# Patient Record
Sex: Male | Born: 1945
Health system: Southern US, Community
[De-identification: ages and names within clinical notes are randomized; demographics above are authoritative.]

## PROBLEM LIST (undated history)

## (undated) DIAGNOSIS — M4807 Spinal stenosis, lumbosacral region: Secondary | ICD-10-CM

## (undated) DIAGNOSIS — M48062 Spinal stenosis, lumbar region with neurogenic claudication: Secondary | ICD-10-CM

## (undated) DIAGNOSIS — I4891 Unspecified atrial fibrillation: Secondary | ICD-10-CM

## (undated) DIAGNOSIS — L570 Actinic keratosis: Secondary | ICD-10-CM

## (undated) DIAGNOSIS — I519 Heart disease, unspecified: Secondary | ICD-10-CM

## (undated) DIAGNOSIS — G5601 Carpal tunnel syndrome, right upper limb: Secondary | ICD-10-CM

## (undated) DIAGNOSIS — N400 Enlarged prostate without lower urinary tract symptoms: Secondary | ICD-10-CM

## (undated) DIAGNOSIS — N529 Male erectile dysfunction, unspecified: Secondary | ICD-10-CM

## (undated) DIAGNOSIS — Z974 Presence of external hearing-aid: Secondary | ICD-10-CM

## (undated) DIAGNOSIS — M1990 Unspecified osteoarthritis, unspecified site: Secondary | ICD-10-CM

## (undated) DIAGNOSIS — K219 Gastro-esophageal reflux disease without esophagitis: Secondary | ICD-10-CM

## (undated) DIAGNOSIS — T7840XA Allergy, unspecified, initial encounter: Secondary | ICD-10-CM

## (undated) DIAGNOSIS — M5126 Other intervertebral disc displacement, lumbar region: Secondary | ICD-10-CM

## (undated) DIAGNOSIS — M9983 Other biomechanical lesions of lumbar region: Secondary | ICD-10-CM

## (undated) DIAGNOSIS — I10 Essential (primary) hypertension: Secondary | ICD-10-CM

## (undated) DIAGNOSIS — E785 Hyperlipidemia, unspecified: Secondary | ICD-10-CM

## (undated) DIAGNOSIS — M19012 Primary osteoarthritis, left shoulder: Secondary | ICD-10-CM

## (undated) DIAGNOSIS — C439 Malignant melanoma of skin, unspecified: Secondary | ICD-10-CM

## (undated) HISTORY — DX: Allergy, unspecified, initial encounter: T78.40XA

## (undated) HISTORY — DX: Hyperlipidemia, unspecified: E78.5

## (undated) HISTORY — DX: Gastro-esophageal reflux disease without esophagitis: K21.9

## (undated) HISTORY — PX: MELANOMA EXCISION: SHX5266

## (undated) HISTORY — DX: Other intervertebral disc displacement, lumbar region: M51.26

## (undated) HISTORY — PX: KNEE ARTHROSCOPY: SUR90

## (undated) HISTORY — PX: COLONOSCOPY: SHX174

## (undated) HISTORY — DX: Malignant melanoma of skin, unspecified: C43.9

## (undated) HISTORY — PX: HAND SURGERY: SHX662

---

## 2002-06-13 HISTORY — PX: CATARACT EXTRACTION: SUR2

## 2003-03-15 HISTORY — PX: CARDIAC CATHETERIZATION: SHX172

## 2004-01-29 ENCOUNTER — Ambulatory Visit: Payer: Self-pay | Admitting: Internal Medicine

## 2004-03-17 ENCOUNTER — Ambulatory Visit: Payer: Self-pay | Admitting: Specialist

## 2004-04-02 ENCOUNTER — Ambulatory Visit: Payer: Self-pay | Admitting: Specialist

## 2004-08-13 ENCOUNTER — Encounter: Payer: Self-pay | Admitting: Specialist

## 2004-09-11 ENCOUNTER — Encounter: Payer: Self-pay | Admitting: Specialist

## 2004-10-12 ENCOUNTER — Encounter: Payer: Self-pay | Admitting: Specialist

## 2004-12-29 ENCOUNTER — Ambulatory Visit: Payer: Self-pay | Admitting: Family Medicine

## 2005-06-04 ENCOUNTER — Ambulatory Visit: Payer: Self-pay | Admitting: Specialist

## 2005-09-07 ENCOUNTER — Ambulatory Visit: Payer: Self-pay | Admitting: Specialist

## 2005-10-04 ENCOUNTER — Encounter: Payer: Self-pay | Admitting: Specialist

## 2005-10-12 ENCOUNTER — Encounter: Payer: Self-pay | Admitting: Specialist

## 2005-11-12 ENCOUNTER — Encounter: Payer: Self-pay | Admitting: Specialist

## 2006-03-17 ENCOUNTER — Ambulatory Visit: Payer: Self-pay | Admitting: General Practice

## 2006-05-16 ENCOUNTER — Ambulatory Visit: Payer: Self-pay | Admitting: Ophthalmology

## 2007-09-05 ENCOUNTER — Ambulatory Visit: Payer: Self-pay

## 2007-09-26 ENCOUNTER — Ambulatory Visit: Payer: Self-pay | Admitting: Family Medicine

## 2008-03-14 HISTORY — PX: SHOULDER SURGERY: SHX246

## 2008-08-01 ENCOUNTER — Ambulatory Visit: Payer: Self-pay | Admitting: Family Medicine

## 2008-10-14 ENCOUNTER — Other Ambulatory Visit: Payer: Self-pay | Admitting: Family Medicine

## 2009-04-14 ENCOUNTER — Encounter: Payer: Self-pay | Admitting: Specialist

## 2009-05-12 ENCOUNTER — Encounter: Payer: Self-pay | Admitting: Specialist

## 2009-07-30 ENCOUNTER — Other Ambulatory Visit: Payer: Self-pay | Admitting: Family Medicine

## 2010-01-28 ENCOUNTER — Other Ambulatory Visit: Payer: Self-pay | Admitting: Family Medicine

## 2010-09-20 ENCOUNTER — Other Ambulatory Visit: Payer: Self-pay | Admitting: Family Medicine

## 2010-09-20 LAB — CBC WITH DIFFERENTIAL/PLATELET
Basophil #: 0 x10 (ref 0.0–0.1)
Basophil %: 0.7 %
Eosinophil #: 0.1 x10 (ref 0.0–0.7)
Eosinophil %: 1.8 %
HCT: 42.6 % (ref 40.0–52.0)
HGB: 14.5 g/dL (ref 13.0–18.0)
Lymphocyte #: 1.9 x10 (ref 1.0–3.6)
Lymphocyte %: 36.1 %
MCH: 33.8 pg (ref 26.0–34.0)
MCHC: 34.1 g/dL (ref 32.0–36.0)
MCV: 99 fL (ref 80–100)
Monocyte #: 0.7 x10 (ref 0.0–0.7)
Monocyte %: 13.3 %
Neutrophil #: 2.5 x10 (ref 1.4–6.5)
Neutrophil %: 48.1 %
Platelet: 170 x10 (ref 150–440)
RBC: 4.3 x10 — ABNORMAL LOW (ref 4.40–5.90)
RDW: 13.1 % (ref 11.5–14.5)
WBC: 5.1 x10 (ref 3.8–10.6)

## 2010-09-20 LAB — LIPID PANEL
Cholesterol: 138 mg/dL (ref 0–200)
HDL Cholesterol: 56 mg/dL (ref 40–60)
Ldl Cholesterol, Calc: 64 mg/dL (ref 0–100)
Triglycerides: 92 mg/dL (ref 0–200)
VLDL Cholesterol, Calc: 18 mg/dL (ref 5–40)

## 2010-09-20 LAB — COMPREHENSIVE METABOLIC PANEL
Albumin: 4.2 g/dL (ref 3.4–5.0)
Alkaline Phosphatase: 62 U/L (ref 50–136)
Anion Gap: 8 (ref 7–16)
BUN: 29 mg/dL — ABNORMAL HIGH (ref 7–18)
Bilirubin,Total: 0.6 mg/dL (ref 0.2–1.0)
Calcium, Total: 8.9 mg/dL (ref 8.5–10.1)
Chloride: 108 mmol/L — ABNORMAL HIGH (ref 98–107)
Co2: 27 mmol/L (ref 21–32)
Creatinine: 0.94 mg/dL (ref 0.60–1.30)
EGFR (African American): >60
EGFR (Non-African Amer.): >60
Glucose: 92 mg/dL (ref 65–99)
Osmolality: 290 (ref 275–301)
Potassium: 4.2 mmol/L (ref 3.5–5.1)
SGOT(AST): 36 U/L (ref 15–37)
SGPT (ALT): 31 U/L (ref 30–79)
Sodium: 143 mmol/L (ref 136–145)
Total Protein: 7.3 g/dL (ref 6.4–8.2)

## 2010-09-20 LAB — TSH: Thyroid Stimulating Horm: 1.46 u[IU]/mL

## 2010-09-21 LAB — PSA: PSA: 0.4 ng/mL (ref 0.0–4.0)

## 2011-09-30 ENCOUNTER — Other Ambulatory Visit: Payer: Self-pay | Admitting: Family Medicine

## 2011-09-30 LAB — CBC WITH DIFFERENTIAL/PLATELET
Basophil #: 0.1 10*3/uL (ref 0.0–0.1)
Basophil %: 0.9 %
Eosinophil #: 0.2 10*3/uL (ref 0.0–0.7)
Eosinophil %: 2.8 %
HCT: 42.1 % (ref 40.0–52.0)
HGB: 14.4 g/dL (ref 13.0–18.0)
Lymphocyte #: 2 10*3/uL (ref 1.0–3.6)
Lymphocyte %: 33.9 %
MCH: 32.5 pg (ref 26.0–34.0)
MCHC: 34.2 g/dL (ref 32.0–36.0)
MCV: 95 fL (ref 80–100)
Monocyte #: 0.7 x10 3/mm (ref 0.2–1.0)
Monocyte %: 12.6 %
Neutrophil #: 2.9 10*3/uL (ref 1.4–6.5)
Neutrophil %: 49.8 %
Platelet: 229 10*3/uL (ref 150–440)
RBC: 4.42 10*6/uL (ref 4.40–5.90)
RDW: 13.4 % (ref 11.5–14.5)
WBC: 5.9 10*3/uL (ref 3.8–10.6)

## 2011-09-30 LAB — COMPREHENSIVE METABOLIC PANEL
Albumin: 3.9 g/dL (ref 3.4–5.0)
Alkaline Phosphatase: 82 U/L (ref 50–136)
Anion Gap: 8 (ref 7–16)
BUN: 24 mg/dL — ABNORMAL HIGH (ref 7–18)
Bilirubin,Total: 0.4 mg/dL (ref 0.2–1.0)
Calcium, Total: 8.9 mg/dL (ref 8.5–10.1)
Chloride: 106 mmol/L (ref 98–107)
Co2: 27 mmol/L (ref 21–32)
Creatinine: 0.82 mg/dL (ref 0.60–1.30)
EGFR (African American): >60
EGFR (Non-African Amer.): >60
Glucose: 103 mg/dL — ABNORMAL HIGH (ref 65–99)
Osmolality: 286 (ref 275–301)
Potassium: 4 mmol/L (ref 3.5–5.1)
SGOT(AST): 28 U/L (ref 15–37)
SGPT (ALT): 31 U/L
Sodium: 141 mmol/L (ref 136–145)
Total Protein: 7.2 g/dL (ref 6.4–8.2)

## 2011-09-30 LAB — LIPID PANEL
Cholesterol: 173 mg/dL (ref 0–200)
HDL Cholesterol: 41 mg/dL (ref 40–60)
Ldl Cholesterol, Calc: 57 mg/dL (ref 0–100)
Triglycerides: 375 mg/dL — ABNORMAL HIGH (ref 0–200)
VLDL Cholesterol, Calc: 75 mg/dL — ABNORMAL HIGH (ref 5–40)

## 2011-09-30 LAB — TSH: Thyroid Stimulating Horm: 2.32 u[IU]/mL

## 2011-10-24 ENCOUNTER — Ambulatory Visit: Payer: Self-pay | Admitting: Cardiology

## 2012-01-18 ENCOUNTER — Other Ambulatory Visit: Payer: Self-pay | Admitting: Unknown Physician Specialty

## 2012-02-23 ENCOUNTER — Ambulatory Visit: Payer: Self-pay | Admitting: Unknown Physician Specialty

## 2012-02-27 LAB — PATHOLOGY REPORT

## 2013-10-23 LAB — HEPATIC FUNCTION PANEL
ALT: 25 U/L (ref 10–40)
AST: 33 U/L (ref 14–40)
Alkaline Phosphatase: 67 U/L (ref 25–125)
Bilirubin, Total: 0.5 mg/dL

## 2013-10-23 LAB — CBC AND DIFFERENTIAL
HCT: 40 % — AB (ref 41–53)
Hemoglobin: 13.8 g/dL (ref 13.5–17.5)
Neutrophils Absolute: 2 /uL
Platelets: 246 10*3/uL (ref 150–399)
WBC: 4 10^3/mL

## 2013-10-23 LAB — LIPID PANEL
Cholesterol: 174 mg/dL (ref 0–200)
HDL: 56 mg/dL (ref 35–70)
LDL Cholesterol: 96 mg/dL
LDl/HDL Ratio: 1.7
Triglycerides: 108 mg/dL (ref 40–160)

## 2014-08-19 DIAGNOSIS — M1990 Unspecified osteoarthritis, unspecified site: Secondary | ICD-10-CM | POA: Insufficient documentation

## 2014-08-19 DIAGNOSIS — J309 Allergic rhinitis, unspecified: Secondary | ICD-10-CM | POA: Insufficient documentation

## 2014-08-19 DIAGNOSIS — N400 Enlarged prostate without lower urinary tract symptoms: Secondary | ICD-10-CM | POA: Insufficient documentation

## 2014-08-19 DIAGNOSIS — K30 Functional dyspepsia: Secondary | ICD-10-CM | POA: Insufficient documentation

## 2014-08-19 DIAGNOSIS — L570 Actinic keratosis: Secondary | ICD-10-CM | POA: Insufficient documentation

## 2014-08-19 DIAGNOSIS — K219 Gastro-esophageal reflux disease without esophagitis: Secondary | ICD-10-CM | POA: Insufficient documentation

## 2014-08-19 DIAGNOSIS — N529 Male erectile dysfunction, unspecified: Secondary | ICD-10-CM | POA: Insufficient documentation

## 2014-08-19 DIAGNOSIS — E7800 Pure hypercholesterolemia, unspecified: Secondary | ICD-10-CM | POA: Insufficient documentation

## 2014-11-10 ENCOUNTER — Encounter: Payer: Self-pay | Admitting: Family Medicine

## 2014-11-10 ENCOUNTER — Ambulatory Visit (INDEPENDENT_AMBULATORY_CARE_PROVIDER_SITE_OTHER): Payer: Commercial Managed Care - HMO | Admitting: Family Medicine

## 2014-11-10 VITALS — BP 108/60 | HR 60 | Temp 97.7°F | Resp 16 | Ht 67.0 in | Wt 174.0 lb

## 2014-11-10 DIAGNOSIS — E7800 Pure hypercholesterolemia, unspecified: Secondary | ICD-10-CM

## 2014-11-10 DIAGNOSIS — N528 Other male erectile dysfunction: Secondary | ICD-10-CM

## 2014-11-10 DIAGNOSIS — Z0000 Encounter for general adult medical examination without abnormal findings: Secondary | ICD-10-CM

## 2014-11-10 DIAGNOSIS — Z23 Encounter for immunization: Secondary | ICD-10-CM | POA: Diagnosis not present

## 2014-11-10 DIAGNOSIS — J309 Allergic rhinitis, unspecified: Secondary | ICD-10-CM | POA: Diagnosis not present

## 2014-11-10 DIAGNOSIS — N529 Male erectile dysfunction, unspecified: Secondary | ICD-10-CM

## 2014-11-10 NOTE — Progress Notes (Signed)
Patient ID: Jeffery Davis, male   DOB: 12-28-1945, 69 y.o.   MRN: [id number] Patient: Jeffery Davis, Male    DOB: [birth year], 69 y.o.   MRN: [id number] Visit Date: 11/10/2014  Today's Provider: [name], MD   Chief Complaint  Patient presents with  . Annual Exam   Subjective:   Jeffery Davis is a 69 y.o. male who presents today for his Subsequent Annual Wellness Visit. He feels well. He reports exercising tried to everyday. He reports he is sleeping well.  Colonoscopy- 02/23/12 EKG- 10/06/11  Needs Prevnar He is exercising daily and he feels well. He needs lab work to follow-up on his lipids. He is enjoying retirement. He regularly goes deep sea fishing as his main application and is main exercise is by cycling. He rode 28 miles this morning. Review of Systems  Constitutional: Negative.   HENT: Negative.   Eyes: Negative.   Respiratory: Negative.   Cardiovascular: Negative.   Gastrointestinal: Negative.   Endocrine: Negative.   Genitourinary: Negative.   Musculoskeletal: Negative.   Skin: Negative.   Allergic/Immunologic: Negative.   Neurological: Negative.   Hematological: Bruises/bleeds easily.  Psychiatric/Behavioral: Negative.     Patient Active Problem List   Diagnosis Date Noted  . Actinic keratoses 08/19/2014  . Allergic rhinitis 08/19/2014  . Benign fibroma of prostate 08/19/2014  . Acid indigestion 08/19/2014  . Failure of erection 08/19/2014  . Acid reflux 08/19/2014  . Hypercholesteremia 08/19/2014  . Arthritis, degenerative 08/19/2014    Social History   Social History  . Marital Status: Married    Spouse Name: N/A  . Number of Children: N/A  . Years of Education: N/A   Occupational History  . Retired      worked at [employer] 12 years.   .     Social History Main Topics  . Smoking status: Never Smoker   . Smokeless tobacco: Not on file  . Alcohol Use: 0.6 oz/week    1 Standard drinks or equivalent per week      Comment: 1 glass wine 3-4 times a week  . Drug Use: No  . Sexual Activity: Not on file   Other Topics Concern  . Not on file   Social History Narrative    Past Surgical History  Procedure Laterality Date  . Shoulder surgery Left 2010  . Cataract extraction  06/2002  . Knee arthroscopy Right     His family history includes CAD in his father; Cancer in his mother; Hypertension in his father.    Outpatient Prescriptions Prior to Visit  Medication Sig Dispense Refill  . aspirin 81 MG tablet Take by mouth daily.    Marland Kitchen atorvastatin (LIPITOR) 10 MG tablet Take by mouth daily.    . naproxen sodium (ANAPROX) 220 MG tablet Take by mouth as needed.    . Omeprazole 20 MG TBEC Take by mouth every other day.    . tadalafil (CIALIS) 5 MG tablet Take by mouth as needed.     No facility-administered medications prior to visit.    No Known Allergies  Patient Care Team: Jerrol Banana., MD as PCP - General (Family Medicine)  Objective:   Vitals:  Filed Vitals:   11/10/14 0927  BP: 108/60  Pulse: 60  Temp: 97.7 F (36.5 C)  TempSrc: Oral  Resp: 16  Height: 5\' 7"  (1.702 m)  Weight: 174 lb (78.926 kg)    Physical Exam  Constitutional: He is oriented to  person, place, and time. He appears well-developed and well-nourished.  HENT:  Head: Normocephalic and atraumatic.  Right Ear: External ear normal.  Left Ear: External ear normal.  Nose: Nose normal.  Eyes: Conjunctivae are normal.  Neck: Neck supple.  Cardiovascular: Normal rate, regular rhythm, normal heart sounds and intact distal pulses.   Pulmonary/Chest: Effort normal and breath sounds normal.  Abdominal: Soft. Bowel sounds are normal.  Genitourinary: Rectum normal, prostate normal and penis normal.  Musculoskeletal: Normal range of motion.  Neurological: He is alert and oriented to person, place, and time.  Skin: Skin is warm and dry.  Psychiatric: He has a normal mood and affect. His behavior is normal. Judgment  and thought content normal.    Activities of Daily Living In your present state of health, do you have any difficulty performing the following activities: 11/10/2014  Hearing? Y  Vision? N  Difficulty concentrating or making decisions? N  Walking or climbing stairs? N  Dressing or bathing? N  Doing errands, shopping? N    Fall Risk Assessment Fall Risk  11/10/2014  Falls in the past year? No     Depression Screen PHQ 2/9 Scores 11/10/2014  PHQ - 2 Score 0    Cognitive Testing - 6-CIT    Year: 0 points  Month: 0 points  Memorize "Jeffery Davis, [address] [place]"  Time (within 1 hour:) 0 points  Count backwards from 20: 0 points  Name months of year: 0 points  Repeat Address: 2 points   Total Score: 2/28  Interpretation : Normal (0-7) Abnormal (8-28)    Assessment & Plan:     Annual Wellness Visit  Reviewed patient's Family Medical History Reviewed and updated list of patient's medical providers Assessment of cognitive impairment was done Assessed patient's functional ability Established a written schedule for health screening [place] Completed and Reviewed  Exercise Activities and Dietary recommendations Goals    None      Immunization History  Administered Date(s) Administered  . Pneumococcal Polysaccharide-23 10/06/2011  . Td 12/22/2003  . Tdap 10/06/2011  . Zoster 03/14/2010    Health Maintenance  Topic Date Due  . Hepatitis C Screening  February 19, 1946  . COLONOSCOPY  09/12/1995  . PNA vac Low Risk Adult (2 of 2 - PCV13) 10/05/2012  . INFLUENZA VACCINE  10/13/2014  . TETANUS/TDAP  10/05/2021  . ZOSTAVAX  Completed      Discussed health benefits of physical activity, and encouraged him to engage in regular exercise appropriate for his age and condition.  Hyperlipidemia GERD ED Check labs for above problems.  I have done the exam and reviewed the above chart and it is accurate to the best of my knowledge.   [facility] 11/10/2014 9:31 AM  ------------------------------------------------------------------------------------------------------------

## 2014-11-11 LAB — COMPREHENSIVE METABOLIC PANEL
ALT: 21 IU/L (ref 0–44)
AST: 27 IU/L (ref 0–40)
Albumin/Globulin Ratio: 2.3 (ref 1.1–2.5)
Albumin: 4.3 g/dL (ref 3.6–4.8)
Alkaline Phosphatase: 56 IU/L (ref 39–117)
BUN/Creatinine Ratio: 23 — ABNORMAL HIGH (ref 10–22)
BUN: 19 mg/dL (ref 8–27)
Bilirubin Total: 0.6 mg/dL (ref 0.0–1.2)
CO2: 24 mmol/L (ref 18–29)
Calcium: 8.8 mg/dL (ref 8.6–10.2)
Chloride: 100 mmol/L (ref 97–108)
Creatinine, Ser: 0.84 mg/dL (ref 0.76–1.27)
GFR calc Af Amer: 103 mL/min/{1.73_m2} (ref >59)
GFR calc non Af Amer: 89 mL/min/{1.73_m2} (ref >59)
Globulin, Total: 1.9 g/dL (ref 1.5–4.5)
Glucose: 88 mg/dL (ref 65–99)
Potassium: 4.4 mmol/L (ref 3.5–5.2)
Sodium: 139 mmol/L (ref 134–144)
Total Protein: 6.2 g/dL (ref 6.0–8.5)

## 2014-11-11 LAB — CBC WITH DIFFERENTIAL/PLATELET
Basophils Absolute: 0 10*3/uL (ref 0.0–0.2)
Basos: 0 %
EOS (ABSOLUTE): 0.1 10*3/uL (ref 0.0–0.4)
Eos: 1 %
Hematocrit: 41.3 % (ref 37.5–51.0)
Hemoglobin: 14.4 g/dL (ref 12.6–17.7)
Immature Grans (Abs): 0 10*3/uL (ref 0.0–0.1)
Immature Granulocytes: 0 %
Lymphocytes Absolute: 1.9 10*3/uL (ref 0.7–3.1)
Lymphs: 28 %
MCH: 32.3 pg (ref 26.6–33.0)
MCHC: 34.9 g/dL (ref 31.5–35.7)
MCV: 93 fL (ref 79–97)
Monocytes Absolute: 0.6 10*3/uL (ref 0.1–0.9)
Monocytes: 9 %
Neutrophils Absolute: 4.2 10*3/uL (ref 1.4–7.0)
Neutrophils: 62 %
Platelets: 243 10*3/uL (ref 150–379)
RBC: 4.46 x10E6/uL (ref 4.14–5.80)
RDW: 14.5 % (ref 12.3–15.4)
WBC: 6.8 10*3/uL (ref 3.4–10.8)

## 2014-11-11 LAB — LIPID PANEL WITH LDL/HDL RATIO
Cholesterol, Total: 158 mg/dL (ref 100–199)
HDL: 50 mg/dL (ref >39)
LDL Calculated: 83 mg/dL (ref 0–99)
LDl/HDL Ratio: 1.7 ratio units (ref 0.0–3.6)
Triglycerides: 124 mg/dL (ref 0–149)
VLDL Cholesterol Cal: 25 mg/dL (ref 5–40)

## 2014-11-11 LAB — TSH: TSH: 2.34 u[IU]/mL (ref 0.450–4.500)

## 2015-01-01 ENCOUNTER — Ambulatory Visit: Payer: Commercial Managed Care - HMO

## 2015-04-02 ENCOUNTER — Other Ambulatory Visit: Payer: Self-pay | Admitting: Emergency Medicine

## 2015-04-02 DIAGNOSIS — K219 Gastro-esophageal reflux disease without esophagitis: Secondary | ICD-10-CM

## 2015-04-02 MED ORDER — OMEPRAZOLE 20 MG PO TBEC: 20.0000 mg | DELAYED_RELEASE_TABLET | Freq: Every day | ORAL | Status: DC

## 2015-06-02 DIAGNOSIS — Z961 Presence of intraocular lens: Secondary | ICD-10-CM | POA: Diagnosis not present

## 2015-06-02 DIAGNOSIS — H5203 Hypermetropia, bilateral: Secondary | ICD-10-CM | POA: Diagnosis not present

## 2015-06-02 DIAGNOSIS — H5702 Anisocoria: Secondary | ICD-10-CM | POA: Diagnosis not present

## 2015-06-02 DIAGNOSIS — H52223 Regular astigmatism, bilateral: Secondary | ICD-10-CM | POA: Diagnosis not present

## 2015-06-02 DIAGNOSIS — H524 Presbyopia: Secondary | ICD-10-CM | POA: Diagnosis not present

## 2015-09-29 ENCOUNTER — Encounter: Payer: Self-pay | Admitting: Family Medicine

## 2015-09-29 ENCOUNTER — Ambulatory Visit (INDEPENDENT_AMBULATORY_CARE_PROVIDER_SITE_OTHER): Payer: PPO | Admitting: Family Medicine

## 2015-09-29 VITALS — BP 146/82 | HR 54 | Temp 97.8°F | Resp 16 | Ht 67.0 in | Wt 171.0 lb

## 2015-09-29 DIAGNOSIS — E7800 Pure hypercholesterolemia, unspecified: Secondary | ICD-10-CM

## 2015-09-29 DIAGNOSIS — Z1211 Encounter for screening for malignant neoplasm of colon: Secondary | ICD-10-CM | POA: Diagnosis not present

## 2015-09-29 DIAGNOSIS — K219 Gastro-esophageal reflux disease without esophagitis: Secondary | ICD-10-CM

## 2015-09-29 DIAGNOSIS — Z0000 Encounter for general adult medical examination without abnormal findings: Secondary | ICD-10-CM | POA: Diagnosis not present

## 2015-09-29 DIAGNOSIS — W19XXXA Unspecified fall, initial encounter: Secondary | ICD-10-CM | POA: Diagnosis not present

## 2015-09-29 DIAGNOSIS — R0602 Shortness of breath: Secondary | ICD-10-CM | POA: Diagnosis not present

## 2015-09-29 DIAGNOSIS — T1490 Injury, unspecified: Secondary | ICD-10-CM

## 2015-09-29 DIAGNOSIS — R0789 Other chest pain: Secondary | ICD-10-CM | POA: Insufficient documentation

## 2015-09-29 LAB — IFOBT (OCCULT BLOOD): IFOBT: NEGATIVE

## 2015-09-29 NOTE — Progress Notes (Signed)
Patient: Jeffery Davis, Male    DOB: May 18, 1945, 70 y.o.   MRN: [id number] Visit Date: 09/29/2015  Today's Provider: [name], MD   Chief Complaint  Patient presents with  . Medicare Wellness  . Shortness of Breath   Subjective:    Annual wellness visit Jeffery Davis is a 70 y.o. male. He feels well. He reports exercising daily; walks 3-5 miles, bicycles 15-25 miles, swims. He reports he is sleeping well.  Last colonoscopy- 02/23/2012. 1 polyp. Diverticulosis. Internal hemorrhoids. Immunizations UTD. -----------------------------------------------------------  Dyspnea: Patient complains of shortness of breath after one flight stairs.  Symptoms include edema only in hands during the summer months, frequent throat clearing, post nasal drip, shortness of breath and tightness in chest. Symptoms began 3 months ago, unchanged since that time.  Patient denies chest pain, located epigastric or chest, constant cough, productive cough and wheezing.    Review of Systems  Constitutional: Positive for fatigue (after donating blood).  Respiratory: Positive for shortness of breath.   Hematological: Bruises/bleeds easily.  Psychiatric/Behavioral: Negative.   All other systems reviewed and are negative.   Social History   Social History  . Marital Status: Married    Spouse Name: Coralyn Mark  . Number of Children: 2  . Years of Education: bachelors   Occupational History  . Retired      worked at [employer] 12 years.   .     Social History Main Topics  . Smoking status: Never Smoker   . Smokeless tobacco: Never Used  . Alcohol Use: 0.6 oz/week    1 Standard drinks or equivalent per week     Comment: 1-2 glasses wine 3-4 times a week  . Drug Use: No  . Sexual Activity: Not on file   Other Topics Concern  . Not on file   Social History Narrative    Past Medical History  Diagnosis Date  . Allergy   . GERD (gastroesophageal reflux disease)   .  Hyperlipidemia      Patient Active Problem List   Diagnosis Date Noted  . Actinic keratoses 08/19/2014  . Allergic rhinitis 08/19/2014  . Benign fibroma of prostate 08/19/2014  . Acid indigestion 08/19/2014  . Failure of erection 08/19/2014  . Acid reflux 08/19/2014  . Hypercholesteremia 08/19/2014  . Arthritis, degenerative 08/19/2014    Past Surgical History  Procedure Laterality Date  . Shoulder surgery Left 2010  . Cataract extraction  06/2002  . Knee arthroscopy Right     His family history includes CAD in his father; Cancer in his mother; Healthy in his daughter and son; Hypertension in his father.    Current Meds  Medication Sig  . aspirin 81 MG tablet Take by mouth daily.  Marland Kitchen atorvastatin (LIPITOR) 10 MG tablet Take by mouth daily.  . Coenzyme Q10 (CO Q 10) 10 MG CAPS Take by mouth.  Marland Kitchen glucosamine-chondroitin 500-400 MG tablet Take 1 tablet by mouth 3 (three) times daily.  . naproxen sodium (ANAPROX) 220 MG tablet Take by mouth as needed.  . Omeprazole 20 MG TBEC Take 1 tablet (20 mg total) by mouth daily.  . sildenafil (REVATIO) 20 MG tablet Take 20 mg by mouth 3 (three) times daily.    Patient Care Team: Jerrol Banana., MD as PCP - General (Family Medicine)    Objective:   Vitals: BP 148/80 mmHg  Pulse 54  Temp(Src) 97.8 F (36.6 C) (Oral)  Resp 16  Ht 5\' 7"  (1.702 m)  Wt 171 lb (77.565 kg)  BMI 26.78 kg/m2  SpO2 100%  Physical Exam  Constitutional: He is oriented to person, place, and time. He appears well-developed and well-nourished. No distress.  HENT:  Head: Normocephalic and atraumatic.  Right Ear: Tympanic membrane and external ear normal.  Left Ear: Tympanic membrane and external ear normal.  Nose: Nose normal.  Mouth/Throat: Oropharynx is clear and moist. No oropharyngeal exudate.  Eyes: Conjunctivae and EOM are normal. Pupils are equal, round, and reactive to light.  Neck: Normal range of motion. Neck supple. No tracheal deviation  present. No thyromegaly present.  Cardiovascular: Regular rhythm and normal heart sounds.  Bradycardia present.   Pulmonary/Chest: Effort normal and breath sounds normal. No respiratory distress. He has no wheezes.  Abdominal: Soft. Bowel sounds are normal. He exhibits no distension. There is no tenderness.  Musculoskeletal: Normal range of motion. He exhibits no edema or tenderness.  Lymphadenopathy:    He has no cervical adenopathy.  Neurological: He is alert and oriented to person, place, and time. He has normal reflexes.  Skin: Skin is warm and dry. No rash noted. He is not diaphoretic. No erythema. No pallor.  Psychiatric: He has a normal mood and affect. His behavior is normal. Judgment and thought content normal.    Activities of Daily Living In your present state of health, do you have any difficulty performing the following activities: 09/29/2015 11/10/2014  Hearing? Tempie Donning  Vision? N N  Difficulty concentrating or making decisions? N N  Walking or climbing stairs? N N  Dressing or bathing? N N  Doing errands, shopping? N N    Fall Risk Assessment Fall Risk  09/29/2015 11/10/2014  Falls in the past year? Yes No  Number falls in past yr: 1 -  Injury with Fall? Yes -  Risk for fall due to : History of fall(s) -  Follow up Falls evaluation completed -     Depression Screen PHQ 2/9 Scores 09/29/2015 11/10/2014  PHQ - 2 Score 0 0    Cognitive Testing - 6-CIT  Correct? Score   What year is it? yes 0 0 or 4  What month is it? yes 0 0 or 3  Memorize:    Jeffery Davis,  42,  [address],  [place],      What time is it? (within 1 hour) yes 0 0 or 3  Count backwards from 20 yes 0 0, 2, or 4  Name the months of the year yes 0 0, 2, or 4  Repeat name & address above yes 0 0, 2, 4, 6, 8, or 10       TOTAL SCORE  0/28   Interpretation:  Normal  Normal (0-7) Abnormal (8-28)       Assessment & Plan:     Annual Wellness Visit  Reviewed patient's Family Medical History Reviewed  and updated list of patient's medical providers Assessment of cognitive impairment was done Assessed patient's functional ability Established a written schedule for health screening [place] Completed and Reviewed  Exercise Activities and Dietary recommendations Goals    None      Immunization History  Administered Date(s) Administered  . Pneumococcal Conjugate-13 11/10/2014  . Pneumococcal Polysaccharide-23 10/06/2011  . Td 12/22/2003  . Tdap 10/06/2011  . Zoster 03/14/2010    Health Maintenance  Topic Date Due  . Hepatitis C Screening  [birth year]  . COLONOSCOPY  09/12/1995  . INFLUENZA VACCINE  10/13/2015  .  TETANUS/TDAP  10/05/2021  . ZOSTAVAX  Completed  . PNA vac Low Risk Adult  Completed      Discussed health benefits of physical activity, and encouraged him to engage in regular exercise appropriate for his age and condition.    ------------------------------------------------------------------------------------------------------------ 1. Medicare annual wellness visit, subsequent Stable.   2. Shortness of breath Patient has had normal cardiology evaluation at least twice for the symptoms. He only gets short of breath when going upstairs. EKG WNL. - EKG 12-Lead  3. Hypercholesteremia Check labs and continue medications. - TSH - Lipid panel - CBC with Differential/Platelet - Comprehensive metabolic panel  4. Gastroesophageal reflux disease, esophagitis presence not specified Stable. Continue medications.  5. Chest tightness EKG WNL.  6. Fall with injury Pt fell on treadmill. Not at high risk for falls. Leg injuries from this slowly resolving. 7. Colon cancer screening Negative. - IFOBT POC (occult bld, rslt in office)  Results for orders placed or performed in visit on 09/29/15  IFOBT POC (occult bld, rslt in office)  Result Value Ref Range   IFOBT Negative      Patient seen and examined by Miguel Aschoff, MD, and note  scribed by Renaldo Fiddler, CMA.   Richard Cranford Mon, MD  [facility]

## 2015-09-30 LAB — CBC WITH DIFFERENTIAL/PLATELET
Basophils Absolute: 0 10*3/uL (ref 0.0–0.2)
Basos: 1 %
EOS (ABSOLUTE): 0.1 10*3/uL (ref 0.0–0.4)
Eos: 2 %
Hematocrit: 35.3 % — ABNORMAL LOW (ref 37.5–51.0)
Hemoglobin: 11.7 g/dL — ABNORMAL LOW (ref 12.6–17.7)
Immature Grans (Abs): 0 10*3/uL (ref 0.0–0.1)
Immature Granulocytes: 0 %
Lymphocytes Absolute: 1.8 10*3/uL (ref 0.7–3.1)
Lymphs: 32 %
MCH: 29.7 pg (ref 26.6–33.0)
MCHC: 33.1 g/dL (ref 31.5–35.7)
MCV: 90 fL (ref 79–97)
Monocytes Absolute: 0.7 10*3/uL (ref 0.1–0.9)
Monocytes: 12 %
Neutrophils Absolute: 3 10*3/uL (ref 1.4–7.0)
Neutrophils: 53 %
Platelets: 271 10*3/uL (ref 150–379)
RBC: 3.94 x10E6/uL — ABNORMAL LOW (ref 4.14–5.80)
RDW: 13.5 % (ref 12.3–15.4)
WBC: 5.7 10*3/uL (ref 3.4–10.8)

## 2015-09-30 LAB — COMPREHENSIVE METABOLIC PANEL
ALT: 43 IU/L (ref 0–44)
AST: 45 IU/L — ABNORMAL HIGH (ref 0–40)
Albumin/Globulin Ratio: 1.9 (ref 1.2–2.2)
Albumin: 4.3 g/dL (ref 3.5–4.8)
Alkaline Phosphatase: 81 IU/L (ref 39–117)
BUN/Creatinine Ratio: 28 — ABNORMAL HIGH (ref 10–24)
BUN: 25 mg/dL (ref 8–27)
Bilirubin Total: 0.7 mg/dL (ref 0.0–1.2)
CO2: 25 mmol/L (ref 18–29)
Calcium: 9.1 mg/dL (ref 8.6–10.2)
Chloride: 102 mmol/L (ref 96–106)
Creatinine, Ser: 0.9 mg/dL (ref 0.76–1.27)
GFR calc Af Amer: 100 mL/min/{1.73_m2} (ref >59)
GFR calc non Af Amer: 86 mL/min/{1.73_m2} (ref >59)
Globulin, Total: 2.3 g/dL (ref 1.5–4.5)
Glucose: 90 mg/dL (ref 65–99)
Potassium: 4.3 mmol/L (ref 3.5–5.2)
Sodium: 142 mmol/L (ref 134–144)
Total Protein: 6.6 g/dL (ref 6.0–8.5)

## 2015-09-30 LAB — LIPID PANEL
Chol/HDL Ratio: 2.8 ratio units (ref 0.0–5.0)
Cholesterol, Total: 146 mg/dL (ref 100–199)
HDL: 53 mg/dL (ref >39)
LDL Calculated: 72 mg/dL (ref 0–99)
Triglycerides: 107 mg/dL (ref 0–149)
VLDL Cholesterol Cal: 21 mg/dL (ref 5–40)

## 2015-09-30 LAB — TSH: TSH: 2.14 u[IU]/mL (ref 0.450–4.500)

## 2015-10-02 DIAGNOSIS — M1712 Unilateral primary osteoarthritis, left knee: Secondary | ICD-10-CM | POA: Diagnosis not present

## 2015-10-02 DIAGNOSIS — M19012 Primary osteoarthritis, left shoulder: Secondary | ICD-10-CM | POA: Diagnosis not present

## 2015-10-02 DIAGNOSIS — M7542 Impingement syndrome of left shoulder: Secondary | ICD-10-CM | POA: Diagnosis not present

## 2015-10-02 DIAGNOSIS — M25512 Pain in left shoulder: Secondary | ICD-10-CM | POA: Diagnosis not present

## 2015-11-04 ENCOUNTER — Other Ambulatory Visit: Payer: Self-pay | Admitting: Orthopedic Surgery

## 2015-11-04 DIAGNOSIS — M19012 Primary osteoarthritis, left shoulder: Secondary | ICD-10-CM

## 2015-11-11 ENCOUNTER — Encounter: Payer: Commercial Managed Care - HMO | Admitting: Family Medicine

## 2015-11-11 DIAGNOSIS — L565 Disseminated superficial actinic porokeratosis (DSAP): Secondary | ICD-10-CM | POA: Diagnosis not present

## 2015-11-11 DIAGNOSIS — L853 Xerosis cutis: Secondary | ICD-10-CM | POA: Diagnosis not present

## 2015-11-11 DIAGNOSIS — D229 Melanocytic nevi, unspecified: Secondary | ICD-10-CM | POA: Diagnosis not present

## 2015-11-11 DIAGNOSIS — L578 Other skin changes due to chronic exposure to nonionizing radiation: Secondary | ICD-10-CM | POA: Diagnosis not present

## 2015-11-11 DIAGNOSIS — L570 Actinic keratosis: Secondary | ICD-10-CM | POA: Diagnosis not present

## 2015-11-11 DIAGNOSIS — L821 Other seborrheic keratosis: Secondary | ICD-10-CM | POA: Diagnosis not present

## 2015-11-11 DIAGNOSIS — L918 Other hypertrophic disorders of the skin: Secondary | ICD-10-CM | POA: Diagnosis not present

## 2015-11-11 DIAGNOSIS — Z1283 Encounter for screening for malignant neoplasm of skin: Secondary | ICD-10-CM | POA: Diagnosis not present

## 2015-11-12 ENCOUNTER — Ambulatory Visit
Admission: RE | Admit: 2015-11-12 | Discharge: 2015-11-12 | Disposition: A | Discharge status: Home / Self Care | Payer: PPO | Source: Ambulatory Visit | Attending: Orthopedic Surgery | Admitting: Orthopedic Surgery

## 2015-11-12 DIAGNOSIS — M19012 Primary osteoarthritis, left shoulder: Secondary | ICD-10-CM | POA: Insufficient documentation

## 2015-11-12 DIAGNOSIS — M25512 Pain in left shoulder: Secondary | ICD-10-CM | POA: Diagnosis not present

## 2015-11-12 MED ORDER — BUPIVACAINE HCL (PF) 0.25 % IJ SOLN
7.0000 mL | Freq: Once | INTRAMUSCULAR | Status: AC
  Administered 2015-11-12: 7 mL via INTRA_ARTICULAR
  Filled 2015-11-12: qty 10

## 2015-11-12 MED ORDER — IOPAMIDOL (ISOVUE-300) INJECTION 61%
10.0000 mL | Freq: Once | INTRAVENOUS | Status: AC | PRN
  Administered 2015-11-12: 2 mL

## 2015-11-12 MED ORDER — METHYLPREDNISOLONE ACETATE 40 MG/ML INJ SUSP (RADIOLOG
80.0000 mg | Freq: Once | INTRAMUSCULAR | Status: AC
  Administered 2015-11-12: 80 mg via INTRA_ARTICULAR

## 2015-11-17 ENCOUNTER — Ambulatory Visit (INDEPENDENT_AMBULATORY_CARE_PROVIDER_SITE_OTHER): Payer: PPO | Admitting: Family Medicine

## 2015-11-17 VITALS — BP 118/66 | HR 82 | Temp 98.3°F | Resp 14 | Wt 163.0 lb

## 2015-11-17 DIAGNOSIS — K219 Gastro-esophageal reflux disease without esophagitis: Secondary | ICD-10-CM

## 2015-11-17 DIAGNOSIS — D649 Anemia, unspecified: Secondary | ICD-10-CM | POA: Diagnosis not present

## 2015-11-17 DIAGNOSIS — E7800 Pure hypercholesterolemia, unspecified: Secondary | ICD-10-CM

## 2015-11-17 NOTE — Progress Notes (Signed)
Subjective:  HPI   Patient is here to follow up on borderline anemia. Last CBC and labs were done on 09/28/15. RBC was 3.94, Hemoglobin 11./, Hematocrit 35.3 the rest of CBC was normal. Patient is not having any unusual symptoms. No blood in stool or urine. Pt donated blood just before labs drawn . He gives blood every 2-3 months--25 gallons through the years.  Prior to Admission medications   Medication Sig Start Date End Date Taking? Authorizing Provider  aspirin 81 MG tablet Take by mouth daily.    Historical Provider, MD  atorvastatin (LIPITOR) 10 MG tablet Take by mouth daily. 02/17/14   Historical Provider, MD  cholecalciferol (VITAMIN D) 400 units TABS tablet Take 400 Units by mouth.    Historical Provider, MD  Coenzyme Q10 (CO Q 10) 10 MG CAPS Take by mouth.    Historical Provider, MD  glucosamine-chondroitin 500-400 MG tablet Take 1 tablet by mouth 3 (three) times daily.    Historical Provider, MD  naproxen sodium (ANAPROX) 220 MG tablet Take by mouth as needed.    Historical Provider, MD  Omeprazole 20 MG TBEC Take 1 tablet (20 mg total) by mouth daily. 04/02/15   Bleu Minerd Maceo Pro., MD  sildenafil (REVATIO) 20 MG tablet Take 20 mg by mouth 3 (three) times daily.    Historical Provider, MD    Patient Active Problem List   Diagnosis Date Noted  . Medicare annual wellness visit, subsequent 09/29/2015  . Shortness of breath 09/29/2015  . Chest tightness 09/29/2015  . Fall with injury 09/29/2015  . Actinic keratoses 08/19/2014  . Allergic rhinitis 08/19/2014  . Benign fibroma of prostate 08/19/2014  . Acid indigestion 08/19/2014  . Failure of erection 08/19/2014  . Acid reflux 08/19/2014  . Hypercholesteremia 08/19/2014  . Arthritis, degenerative 08/19/2014    Past Medical History:  Diagnosis Date  . Allergy   . GERD (gastroesophageal reflux disease)   . Hyperlipidemia     Social History   Social History  . Marital status: Married    Spouse name: Coralyn Mark  .  Number of children: 2  . Years of education: bachelors   Occupational History  . Retired      worked at [employer] 12 years.   .  [employer] Topics  . Smoking status: Never Smoker  . Smokeless tobacco: Never Used  . Alcohol use 0.6 oz/week    1 Standard drinks or equivalent per week     Comment: 1-2 glasses wine 3-4 times a week  . Drug use: No  . Sexual activity: Not on file   Other Topics Concern  . Not on file   Social History Narrative  . No narrative on file    No Known Allergies  Review of Systems  Constitutional: Positive for malaise/fatigue (not worse-about the same).  Eyes: Negative.   Respiratory: Negative.   Cardiovascular: Negative.   Gastrointestinal: Negative.   Musculoskeletal: Negative.   Neurological: Negative.   Endo/Heme/Allergies: Negative.   Psychiatric/Behavioral: Negative.     Immunization History  Administered Date(s) Administered  . Pneumococcal Conjugate-13 11/10/2014  . Pneumococcal Polysaccharide-23 10/06/2011  . Td 12/22/2003  . Tdap 10/06/2011  . Zoster 03/14/2010   Objective:  BP 118/66   Pulse 82   Temp 98.3 F (36.8 C)   Resp 14   Wt 163 lb (73.9 kg)   BMI 24.78 kg/m   Physical Exam  Constitutional: He is oriented  to person, place, and time and well-developed, well-nourished, and in no distress.  HENT:  Head: Normocephalic and atraumatic.  Right Ear: External ear normal.  Left Ear: External ear normal.  Nose: Nose normal.  Scab in the left ear-does not look infected right now.  Eyes: Conjunctivae are normal. Pupils are equal, round, and reactive to light. No scleral icterus.  Neck: Normal range of motion. Neck supple.  Cardiovascular: Normal rate, regular rhythm, normal heart sounds and intact distal pulses.   No murmur heard. Pulmonary/Chest: Effort normal and breath sounds normal. No respiratory distress. He has no wheezes.  Abdominal: Soft.  Musculoskeletal: He  exhibits no edema or tenderness.  Neurological: He is alert and oriented to person, place, and time.  Skin: Skin is warm and dry.  Psychiatric: Mood, memory, affect and judgment normal.    Lab Results  Component Value Date   WBC 5.7 09/29/2015   HGB 13.8 10/23/2013   HCT 35.3 (L) 09/29/2015   PLT 271 09/29/2015   GLUCOSE 90 09/29/2015   CHOL 146 09/29/2015   TRIG 107 09/29/2015   HDL 53 09/29/2015   LDLCALC 72 09/29/2015   TSH 2.140 09/29/2015   PSA 0.4 09/20/2010    CMP     Component Value Date/Time   NA 142 09/29/2015 1119   NA 141 09/30/2011 0740   K 4.3 09/29/2015 1119   K 4.0 09/30/2011 0740   CL 102 09/29/2015 1119   CL 106 09/30/2011 0740   CO2 25 09/29/2015 1119   CO2 27 09/30/2011 0740   GLUCOSE 90 09/29/2015 1119   GLUCOSE 103 (H) 09/30/2011 0740   BUN 25 09/29/2015 1119   BUN 24 (H) 09/30/2011 0740   CREATININE 0.90 09/29/2015 1119   CREATININE 0.82 09/30/2011 0740   CALCIUM 9.1 09/29/2015 1119   CALCIUM 8.9 09/30/2011 0740   PROT 6.6 09/29/2015 1119   PROT 7.2 09/30/2011 0740   ALBUMIN 4.3 09/29/2015 1119   ALBUMIN 3.9 09/30/2011 0740   AST 45 (H) 09/29/2015 1119   AST 28 09/30/2011 0740   ALT 43 09/29/2015 1119   ALT 31 09/30/2011 0740   ALKPHOS 81 09/29/2015 1119   ALKPHOS 82 09/30/2011 0740   BILITOT 0.7 09/29/2015 1119   BILITOT 0.4 09/30/2011 0740   GFRNONAA 86 09/29/2015 1119   GFRNONAA >60 09/30/2011 0740   GFRAA 100 09/29/2015 1119   GFRAA >60 09/30/2011 0740    Assessment and Plan :  1. Borderline anemia Will re check levels today. Patient does give blood usually around every 8 weeks. If this level is normal today will just follow, otherwise may need referral for further work up. Advised patient before coming in for follow up in the future to not give blood 2 months before. Asked pt to not give blood 2-3 months prior to future appointments. 2. Hypercholesteremia 3. Gastroesophageal reflux disease, esophagitis presence not  specified 4. Scab in the left ear Left EAC atopic dermatitis. Does not look infected or cancerous. Follow as needed. Could be eczema issue. Advised patient to not put his hearing aid in if possible for a few days.  I have done the exam and reviewed the above chart and it is accurate to the best of my knowledge.  HPI, Exam and A&P transcribed under the direction and in the presence of Miguel Aschoff, MD.   Miguel Aschoff MD [facility] 11/17/2015 3:43 PM

## 2015-11-18 LAB — CBC WITH DIFFERENTIAL/PLATELET
Basophils Absolute: 0 10*3/uL (ref 0.0–0.2)
Basos: 0 %
EOS (ABSOLUTE): 0.1 10*3/uL (ref 0.0–0.4)
Eos: 1 %
Hematocrit: 42.1 % (ref 37.5–51.0)
Hemoglobin: 14.1 g/dL (ref 12.6–17.7)
Immature Grans (Abs): 0 10*3/uL (ref 0.0–0.1)
Immature Granulocytes: 0 %
Lymphocytes Absolute: 1.9 10*3/uL (ref 0.7–3.1)
Lymphs: 26 %
MCH: 30 pg (ref 26.6–33.0)
MCHC: 33.5 g/dL (ref 31.5–35.7)
MCV: 90 fL (ref 79–97)
Monocytes Absolute: 0.9 10*3/uL (ref 0.1–0.9)
Monocytes: 13 %
Neutrophils Absolute: 4.2 10*3/uL (ref 1.4–7.0)
Neutrophils: 60 %
Platelets: 331 10*3/uL (ref 150–379)
RBC: 4.7 x10E6/uL (ref 4.14–5.80)
RDW: 15.4 % (ref 12.3–15.4)
WBC: 7.1 10*3/uL (ref 3.4–10.8)

## 2015-11-19 ENCOUNTER — Telehealth: Payer: Self-pay

## 2015-11-19 NOTE — Telephone Encounter (Signed)
  LMTCB 11/19/2015  Thanks,   -Laura  

## 2015-11-19 NOTE — Telephone Encounter (Signed)
-----   Message from Jerrol Banana., MD sent at 11/18/2015 11:20 AM EDT ----- Blood count back to normal.

## 2015-11-20 NOTE — Telephone Encounter (Signed)
Pt advised.   Thanks,   -Laura  

## 2015-11-24 ENCOUNTER — Ambulatory Visit: Payer: Self-pay | Admitting: Family Medicine

## 2016-01-01 DIAGNOSIS — M19012 Primary osteoarthritis, left shoulder: Secondary | ICD-10-CM | POA: Diagnosis not present

## 2016-01-08 ENCOUNTER — Ambulatory Visit
Admission: RE | Admit: 2016-01-08 | Discharge: 2016-01-08 | Disposition: A | Discharge status: Home / Self Care | Payer: PPO | Source: Ambulatory Visit | Attending: Unknown Physician Specialty | Admitting: Unknown Physician Specialty

## 2016-01-08 ENCOUNTER — Encounter
Admission: RE | Disposition: A | Discharge status: Home / Self Care | Payer: Self-pay | Source: Ambulatory Visit | Attending: Unknown Physician Specialty

## 2016-01-08 ENCOUNTER — Ambulatory Visit: Payer: PPO | Admitting: Anesthesiology

## 2016-01-08 ENCOUNTER — Encounter: Payer: Self-pay | Admitting: *Deleted

## 2016-01-08 DIAGNOSIS — K210 Gastro-esophageal reflux disease with esophagitis: Secondary | ICD-10-CM | POA: Diagnosis not present

## 2016-01-08 DIAGNOSIS — N529 Male erectile dysfunction, unspecified: Secondary | ICD-10-CM | POA: Insufficient documentation

## 2016-01-08 DIAGNOSIS — Z803 Family history of malignant neoplasm of breast: Secondary | ICD-10-CM | POA: Insufficient documentation

## 2016-01-08 DIAGNOSIS — K2270 Barrett's esophagus without dysplasia: Secondary | ICD-10-CM | POA: Insufficient documentation

## 2016-01-08 DIAGNOSIS — M1990 Unspecified osteoarthritis, unspecified site: Secondary | ICD-10-CM | POA: Insufficient documentation

## 2016-01-08 DIAGNOSIS — K259 Gastric ulcer, unspecified as acute or chronic, without hemorrhage or perforation: Secondary | ICD-10-CM | POA: Diagnosis not present

## 2016-01-08 DIAGNOSIS — Z800 Family history of malignant neoplasm of digestive organs: Secondary | ICD-10-CM | POA: Insufficient documentation

## 2016-01-08 DIAGNOSIS — Z9849 Cataract extraction status, unspecified eye: Secondary | ICD-10-CM | POA: Diagnosis not present

## 2016-01-08 DIAGNOSIS — K2960 Other gastritis without bleeding: Secondary | ICD-10-CM | POA: Diagnosis not present

## 2016-01-08 DIAGNOSIS — Z8249 Family history of ischemic heart disease and other diseases of the circulatory system: Secondary | ICD-10-CM | POA: Diagnosis not present

## 2016-01-08 DIAGNOSIS — K2950 Unspecified chronic gastritis without bleeding: Secondary | ICD-10-CM | POA: Insufficient documentation

## 2016-01-08 DIAGNOSIS — K2980 Duodenitis without bleeding: Secondary | ICD-10-CM | POA: Diagnosis not present

## 2016-01-08 HISTORY — PX: ESOPHAGOGASTRODUODENOSCOPY (EGD) WITH PROPOFOL: SHX5813

## 2016-01-08 HISTORY — DX: Male erectile dysfunction, unspecified: N52.9

## 2016-01-08 HISTORY — DX: Unspecified osteoarthritis, unspecified site: M19.90

## 2016-01-08 HISTORY — DX: Benign prostatic hyperplasia without lower urinary tract symptoms: N40.0

## 2016-01-08 LAB — SURGICAL PATHOLOGY

## 2016-01-08 SURGERY — ESOPHAGOGASTRODUODENOSCOPY (EGD) WITH PROPOFOL
Anesthesia: General

## 2016-01-08 MED ORDER — PROPOFOL 10 MG/ML IV BOLUS: INTRAVENOUS | Status: DC | PRN

## 2016-01-08 MED ORDER — PROPOFOL 10 MG/ML IV BOLUS
INTRAVENOUS | Status: DC | PRN
  Administered 2016-01-08: 80 mg via INTRAVENOUS

## 2016-01-08 MED ORDER — MIDAZOLAM HCL 5 MG/5ML IJ SOLN
INTRAMUSCULAR | Status: DC | PRN
  Administered 2016-01-08: 1 mg via INTRAVENOUS

## 2016-01-08 MED ORDER — FENTANYL CITRATE (PF) 100 MCG/2ML IJ SOLN
INTRAMUSCULAR | Status: DC | PRN
  Administered 2016-01-08: 50 ug via INTRAVENOUS

## 2016-01-08 MED ORDER — SODIUM CHLORIDE 0.9 % IV SOLN
INTRAVENOUS | Status: DC
  Administered 2016-01-08: 1000 mL via INTRAVENOUS
  Administered 2016-01-08: 11:00:00 via INTRAVENOUS

## 2016-01-08 MED ORDER — PROPOFOL 500 MG/50ML IV EMUL
INTRAVENOUS | Status: DC | PRN
Start: 2016-01-08 — End: 2016-01-08
  Administered 2016-01-08: 140 ug/kg/min via INTRAVENOUS

## 2016-01-08 MED ORDER — SODIUM CHLORIDE 0.9 % IV SOLN: INTRAVENOUS | Status: DC

## 2016-01-08 MED ORDER — LIDOCAINE 2% (20 MG/ML) 5 ML SYRINGE
INTRAMUSCULAR | Status: DC | PRN
  Administered 2016-01-08: 40 mg via INTRAVENOUS

## 2016-01-08 NOTE — Op Note (Signed)
[facility] Patient Name: Jeffery Davis Procedure Date: 01/08/2016 11:10 AM MRN: [id number] Account #: [id number] Date of Birth: 1945-04-16 Admit Type: Outpatient Age: 70 Room: [facility] ENDO ROOM 4 Gender: Male Note Status: Finalized Procedure:            Upper GI endoscopy Indications:          Follow-up of Barrett's esophagus Providers:            Manya Silvas, MD Referring MD:         Janine Ores. Rosanna Randy, MD (Referring MD) Medicines:            Propofol per Anesthesia Complications:        No immediate complications. Procedure:            Pre-Anesthesia Assessment:                       - After reviewing the risks and benefits, the patient                        was deemed in satisfactory condition to undergo the                        procedure.                       After obtaining informed consent, the endoscope was                        passed under direct vision. Throughout the procedure,                        the patient's blood pressure, pulse, and oxygen                        saturations were monitored continuously. The Endoscope                        was introduced through the mouth, and advanced to the                        second part of duodenum. The upper GI endoscopy was                        accomplished without difficulty. The patient tolerated                        the procedure well. Findings:      There were esophageal mucosal changes secondary to established       short-segment Barrett's disease present in the distal esophagus. The       maximum longitudinal extent of these mucosal changes was 2 cm in length.       Mucosa was biopsied with a cold forceps for histology. One specimen       bottle was sent to pathology. GEJ 40cm.      A few dispersed, small non-bleeding erosions were found in the gastric       antrum. There were no stigmata of recent bleeding. Biopsies were taken       with a cold forceps for histology.  Biopsies were taken with a cold       forceps for Helicobacter  pylori testing.      The examined duodenum was normal. Impression:           - Esophageal mucosal changes secondary to established                        short-segment Barrett's disease. Biopsied.                       - Non-bleeding erosive gastropathy. Biopsied.                       - Normal examined duodenum. Recommendation:       - Await pathology results. Continue medication. Manya Silvas, MD 01/08/2016 11:26:49 AM This report has been signed electronically. Number of Addenda: 0 Note Initiated On: 01/08/2016 11:10 AM      [facility]

## 2016-01-08 NOTE — H&P (Signed)
Primary Care Physician:  [name], MD Primary Gastroenterologist:  Dr. Vira Agar  Pre-Procedure History & Physical: HPI:  Jeffery Davis is a 70 y.o. male is here for an endoscopy.   Past Medical History:  Diagnosis Date  . Actinic keratitis   . Allergy   . Arthritis    degenerative  . Benign fibroma of prostate   . Erectile dysfunction   . GERD (gastroesophageal reflux disease)   . Hyperlipidemia     Past Surgical History:  Procedure Laterality Date  . CATARACT EXTRACTION  06/2002  . KNEE ARTHROSCOPY Right   . SHOULDER SURGERY Left 2010    Prior to Admission medications   Medication Sig Start Date End Date Taking? Authorizing Provider  meloxicam (MOBIC) 7.5 MG tablet Take 7.5 mg by mouth daily.   Yes Historical Provider, MD  aspirin 81 MG tablet Take by mouth daily.    Historical Provider, MD  atorvastatin (LIPITOR) 10 MG tablet Take by mouth daily. 02/17/14   Historical Provider, MD  cholecalciferol (VITAMIN D) 400 units TABS tablet Take 400 Units by mouth.    Historical Provider, MD  Coenzyme Q10 (CO Q 10) 10 MG CAPS Take by mouth.    Historical Provider, MD  glucosamine-chondroitin 500-400 MG tablet Take 1 tablet by mouth 3 (three) times daily.    Historical Provider, MD  naproxen sodium (ANAPROX) 220 MG tablet Take by mouth as needed.    Historical Provider, MD  Omeprazole 20 MG TBEC Take 1 tablet (20 mg total) by mouth daily. 04/02/15   Richard Maceo Pro., MD  sildenafil (REVATIO) 20 MG tablet Take 20 mg by mouth 3 (three) times daily.    Historical Provider, MD    Allergies as of 11/20/2015  . (No Known Allergies)    Family History  Problem Relation Age of Onset  . Cancer Mother     breast and pancreatic  . CAD Father   . Hypertension Father   . Healthy Daughter   . Healthy Son     Social History   Social History  . Marital status: Married    Spouse name: Coralyn Mark  . Number of children: 2  . Years of education: bachelors   Occupational  History  . Retired      worked at [employer] 12 years.   .  [employer] [place] Topics  . Smoking status: Never Smoker  . Smokeless tobacco: Never Used  . Alcohol use 0.6 oz/week    1 Standard drinks or equivalent per week     Comment: 1-2 glasses wine 3-4 times a week  . Drug use: No  . Sexual activity: Not on file   Other Topics Concern  . Not on file   Social History Narrative  . No narrative on file    Review of Systems: See HPI, otherwise negative ROS  Physical Exam: BP (!) 168/97   Pulse 67   Temp (!) 96.5 F (35.8 C) (Tympanic)   Resp 16   Ht 5\' 8"  (1.727 m)   Wt 74.4 kg (164 lb)   SpO2 100%   BMI 24.94 kg/m  General:   Alert,  pleasant and cooperative in NAD Head:  Normocephalic and atraumatic. Neck:  Supple; no masses or thyromegaly. Lungs:  Clear throughout to auscultation.    Heart:  Regular rate and rhythm. Abdomen:  Soft, nontender and nondistended. Normal bowel sounds, without guarding, and without rebound.   Neurologic:  Alert and  oriented x4;  grossly normal neurologically.  Impression/Plan: Jeffery Davis is here for an endoscopy to be performed for follow up Barretts esophagus  Risks, benefits, limitations, and alternatives regarding  endoscopy have been reviewed with the patient.  Questions have been answered.  All parties agreeable.   Gaylyn Cheers, MD  01/08/2016, 11:08 AM

## 2016-01-08 NOTE — Anesthesia Preprocedure Evaluation (Signed)
Anesthesia Evaluation  Patient identified by MRN, date of birth, ID band Patient awake    Reviewed: Allergy & Precautions, NPO status , Patient's Chart, lab work & pertinent test results  Airway Mallampati: II       Dental  (+) Teeth Intact   Pulmonary    breath sounds clear to auscultation       Cardiovascular Exercise Tolerance: Good  Rhythm:Regular Rate:Normal     Neuro/Psych    GI/Hepatic Neg liver ROS, GERD  Medicated,  Endo/Other  negative endocrine ROS  Renal/GU negative Renal ROS  negative genitourinary   Musculoskeletal   Abdominal   Peds negative pediatric ROS (+)  Hematology negative hematology ROS (+)   Anesthesia Other Findings   Reproductive/Obstetrics                             Anesthesia Physical Anesthesia Plan  ASA: II  Anesthesia Plan: General   Post-op Pain Management:    Induction: Intravenous  Airway Management Planned: Natural Airway and Nasal Cannula  Additional Equipment:   Intra-op Plan:   Post-operative Plan:   Informed Consent: I have reviewed the patients History and Physical, chart, labs and discussed the procedure including the risks, benefits and alternatives for the proposed anesthesia with the patient or authorized representative who has indicated his/her understanding and acceptance.     Plan Discussed with: CRNA  Anesthesia Plan Comments:         Anesthesia Quick Evaluation

## 2016-01-08 NOTE — Anesthesia Postprocedure Evaluation (Signed)
Anesthesia Post Note  Patient: Jeffery Davis  Procedure(s) Performed: Procedure(s) (LRB): ESOPHAGOGASTRODUODENOSCOPY (EGD) WITH PROPOFOL (N/A)  Patient location during evaluation: PACU Anesthesia Type: General Level of consciousness: awake Pain management: satisfactory to patient Vital Signs Assessment: post-procedure vital signs reviewed and stable Respiratory status: nonlabored ventilation Cardiovascular status: stable Anesthetic complications: no    Last Vitals:  Vitals:   01/08/16 1120 01/08/16 1128  BP: 126/75 126/75  Pulse: 60 72  Resp: 16 16  Temp: (!) 35.9 C (!) 35.9 C    Last Pain:  Vitals:   01/08/16 1120  TempSrc: Tympanic                 VAN STAVEREN,Ilani Otterson

## 2016-01-08 NOTE — Transfer of Care (Signed)
Immediate Anesthesia Transfer of Care Note  Patient: Jeffery Davis  Procedure(s) Performed: Procedure(s): ESOPHAGOGASTRODUODENOSCOPY (EGD) WITH PROPOFOL (N/A)  Patient Location: PACU and Endoscopy Unit  Anesthesia Type:General  Level of Consciousness: sedated  Airway & Oxygen Therapy: Patient Spontanous Breathing and Patient connected to nasal cannula oxygen  Post-op Assessment: Report given to RN and Post -op Vital signs reviewed and stable  Post vital signs: Reviewed and stable  Last Vitals:  Vitals:   01/08/16 1048  BP: (!) 168/97  Pulse: 67  Resp: 16  Temp: (!) 35.8 C    Last Pain:  Vitals:   01/08/16 1048  TempSrc: Tympanic         Complications: No apparent anesthesia complications

## 2016-01-11 ENCOUNTER — Encounter: Payer: Self-pay | Admitting: Unknown Physician Specialty

## 2016-01-13 ENCOUNTER — Other Ambulatory Visit: Payer: Self-pay | Admitting: Orthopedic Surgery

## 2016-01-13 DIAGNOSIS — M19012 Primary osteoarthritis, left shoulder: Secondary | ICD-10-CM

## 2016-01-26 ENCOUNTER — Ambulatory Visit
Admission: RE | Admit: 2016-01-26 | Discharge: 2016-01-26 | Disposition: A | Discharge status: Home / Self Care | Payer: PPO | Source: Ambulatory Visit | Attending: Orthopedic Surgery | Admitting: Orthopedic Surgery

## 2016-01-26 DIAGNOSIS — M7552 Bursitis of left shoulder: Secondary | ICD-10-CM | POA: Insufficient documentation

## 2016-01-26 DIAGNOSIS — M7582 Other shoulder lesions, left shoulder: Secondary | ICD-10-CM | POA: Insufficient documentation

## 2016-01-26 DIAGNOSIS — M19012 Primary osteoarthritis, left shoulder: Secondary | ICD-10-CM | POA: Insufficient documentation

## 2016-02-01 DIAGNOSIS — M19012 Primary osteoarthritis, left shoulder: Secondary | ICD-10-CM | POA: Diagnosis not present

## 2016-02-08 DIAGNOSIS — M19012 Primary osteoarthritis, left shoulder: Secondary | ICD-10-CM | POA: Diagnosis not present

## 2016-02-09 ENCOUNTER — Other Ambulatory Visit: Payer: Self-pay | Admitting: Family Medicine

## 2016-02-09 MED ORDER — ATORVASTATIN CALCIUM 10 MG PO TABS: 10.0000 mg | ORAL_TABLET | Freq: Every day | ORAL | 3 refills | Status: DC

## 2016-02-09 NOTE — Telephone Encounter (Signed)
Pt contacted office for refill request on the following medications:  atorvastatin (LIPITOR) 10 MG tablet.  [facility]  CB#[phone]/MW

## 2016-02-09 NOTE — Telephone Encounter (Signed)
Done-aa 

## 2016-02-12 ENCOUNTER — Telehealth: Payer: Self-pay

## 2016-02-12 NOTE — Telephone Encounter (Signed)
Advised patient on voicemail that he needs to call and make appointment for surgical clearance for surgery that is scheduled on 03/01/16-aa

## 2016-02-16 ENCOUNTER — Encounter: Payer: Self-pay | Admitting: Family Medicine

## 2016-02-16 ENCOUNTER — Ambulatory Visit (INDEPENDENT_AMBULATORY_CARE_PROVIDER_SITE_OTHER): Payer: PPO | Admitting: Family Medicine

## 2016-02-16 VITALS — BP 144/82 | HR 60 | Temp 98.2°F | Resp 14 | Wt 172.0 lb

## 2016-02-16 DIAGNOSIS — Z01818 Encounter for other preprocedural examination: Secondary | ICD-10-CM

## 2016-02-16 DIAGNOSIS — M25512 Pain in left shoulder: Secondary | ICD-10-CM | POA: Diagnosis not present

## 2016-02-16 DIAGNOSIS — G8929 Other chronic pain: Secondary | ICD-10-CM

## 2016-02-16 NOTE — Progress Notes (Signed)
Jeffery Davis  MRN: [id number] DOB: [birth year]  Subjective:  HPI  Patient is here for surgical clearance for left total shoulder replacement scheduled for December 19th with Dr. Mardelle Matte at [facility]. Patient saw this doctor last week to go over imaging results and options. He is scheduled to have labs done through their office this Friday 12/8. He does not have any concerns with surgery. He does not smoke, drinks occasionally and no illegal drug use. No reactions to anesthesia for him or family members that he knows of. He will have rehab set up with them after 6 weeks post surgery. He checks his b/p and it is usually around 120s/70s. He denies chest pain, chest tightness, SOB, blurred vision or numbness or tingling sensation.  BP Readings from Last 3 Encounters:  02/16/16 (!) 144/82  01/08/16 (!) 162/80  11/17/15 118/66   Patient Active Problem List   Diagnosis Date Noted  . Medicare annual wellness visit, subsequent 09/29/2015  . Shortness of breath 09/29/2015  . Chest tightness 09/29/2015  . Fall with injury 09/29/2015  . Actinic keratoses 08/19/2014  . Allergic rhinitis 08/19/2014  . Benign fibroma of prostate 08/19/2014  . Acid indigestion 08/19/2014  . Failure of erection 08/19/2014  . Acid reflux 08/19/2014  . Hypercholesteremia 08/19/2014  . Arthritis, degenerative 08/19/2014    Past Medical History:  Diagnosis Date  . Actinic keratitis   . Allergy   . Arthritis    degenerative  . Benign fibroma of prostate   . Erectile dysfunction   . GERD (gastroesophageal reflux disease)   . Hyperlipidemia     Social History   Social History  . Marital status: Married    Spouse name: Coralyn Mark  . Number of children: 2  . Years of education: bachelors   Occupational History  . Retired      worked at [employer] 12 years.   .  [facility] [facility] [place] Topics  . Smoking status: Never Smoker  . Smokeless tobacco: Never Used   . Alcohol use 0.6 oz/week    1 Standard drinks or equivalent per week     Comment: 1-2 glasses wine 3-4 times a week  . Drug use: No  . Sexual activity: Not on file   Other Topics Concern  . Not on file   Social History Narrative  . No narrative on file    Outpatient Encounter Prescriptions as of 02/16/2016  Medication Sig Note  . aspirin 81 MG tablet Take by mouth daily. 08/19/2014: Received from: [place]:   . atorvastatin (LIPITOR) 10 MG tablet Take 1 tablet (10 mg total) by mouth daily.   . cholecalciferol (VITAMIN D) 400 units TABS tablet Take 400 Units by mouth.   . Coenzyme Q10 (CO Q 10) 10 MG CAPS Take by mouth.   Marland Kitchen glucosamine-chondroitin 500-400 MG tablet Take 1 tablet by mouth 3 (three) times daily.   . Omeprazole 20 MG TBEC Take 1 tablet (20 mg total) by mouth daily.   . sildenafil (REVATIO) 20 MG tablet Take 20 mg by mouth 3 (three) times daily.   . meloxicam (MOBIC) 7.5 MG tablet Take 7.5 mg by mouth daily. 02/16/2016: As needed  . [DISCONTINUED] naproxen sodium (ANAPROX) 220 MG tablet Take by mouth as needed. 08/19/2014: Medication taken as needed.  Received from: [place]:    No facility-administered encounter medications on file as of 02/16/2016.  No Known Allergies  Review of Systems  Constitutional: Negative.   Respiratory: Negative.   Cardiovascular: Negative.   Gastrointestinal: Negative.   Musculoskeletal: Positive for joint pain.  Neurological: Negative.   Endo/Heme/Allergies: Negative.   Psychiatric/Behavioral: Negative.     Objective:  BP (!) 144/82   Pulse 60   Temp 98.2 F (36.8 C)   Resp 14   Wt 172 lb (78 kg)   BMI 26.15 kg/m   Physical Exam  Constitutional: He is oriented to person, place, and time and well-developed, well-nourished, and in no distress.  HENT:  Head: Normocephalic and atraumatic.  Eyes: Conjunctivae are normal. No scleral icterus.  Neck: No  thyromegaly present.  Cardiovascular: Normal rate, regular rhythm and normal heart sounds.   No carotid or epigastric bruit  Pulmonary/Chest: Breath sounds normal.  Abdominal: Soft.  Neurological: He is alert and oriented to person, place, and time. Gait normal.  Skin: Skin is warm and dry.  Psychiatric: Mood, memory, affect and judgment normal.    Assessment and Plan :  1. Pre-operative clearance Patient is cleared for surgery. - EKG 12-Lead He is given medical and cardiac clearance for shoulder surgery. 2. Chronic left shoulder pain  HPI, Exam and A&P transcribed under direction and in the presence of Miguel Aschoff, MD. I have done the exam and reviewed the chart and it is accurate to the best of my knowledge. Development worker, community has been used and  any errors in dictation or transcription are unintentional. Miguel Aschoff M.D. [facility]

## 2016-02-17 ENCOUNTER — Other Ambulatory Visit: Payer: Self-pay | Admitting: Orthopedic Surgery

## 2016-02-19 ENCOUNTER — Encounter (HOSPITAL_COMMUNITY): Payer: Self-pay

## 2016-02-19 ENCOUNTER — Encounter (HOSPITAL_COMMUNITY)
Admission: RE | Admit: 2016-02-19 | Discharge: 2016-02-19 | Disposition: A | Discharge status: Home / Self Care | Payer: PPO | Source: Ambulatory Visit | Attending: Orthopedic Surgery | Admitting: Orthopedic Surgery

## 2016-02-19 DIAGNOSIS — Z01818 Encounter for other preprocedural examination: Secondary | ICD-10-CM | POA: Diagnosis not present

## 2016-02-19 LAB — BASIC METABOLIC PANEL
Anion gap: 9 (ref 5–15)
BUN: 23 mg/dL — ABNORMAL HIGH (ref 6–20)
CO2: 24 mmol/L (ref 22–32)
Calcium: 9.1 mg/dL (ref 8.9–10.3)
Chloride: 106 mmol/L (ref 101–111)
Creatinine, Ser: 0.82 mg/dL (ref 0.61–1.24)
GFR calc Af Amer: >60 mL/min (ref >60)
GFR calc non Af Amer: >60 mL/min (ref >60)
Glucose, Bld: 98 mg/dL (ref 65–99)
Potassium: 4.1 mmol/L (ref 3.5–5.1)
Sodium: 139 mmol/L (ref 135–145)

## 2016-02-19 LAB — SURGICAL PCR SCREEN
MRSA, PCR: NEGATIVE
Staphylococcus aureus: NEGATIVE

## 2016-02-19 LAB — CBC
HCT: 38.3 % — ABNORMAL LOW (ref 39.0–52.0)
Hemoglobin: 12.7 g/dL — ABNORMAL LOW (ref 13.0–17.0)
MCH: 30.3 pg (ref 26.0–34.0)
MCHC: 33.2 g/dL (ref 30.0–36.0)
MCV: 91.4 fL (ref 78.0–100.0)
Platelets: 301 10*3/uL (ref 150–400)
RBC: 4.19 MIL/uL — ABNORMAL LOW (ref 4.22–5.81)
RDW: 14.5 % (ref 11.5–15.5)
WBC: 5.1 10*3/uL (ref 4.0–10.5)

## 2016-02-19 NOTE — Progress Notes (Signed)
PCP - Dr. Miguel Aschoff   -Dr. Rosanna Randy provided medical and cardiac clearance per note on 02/16/16  EKG - 02/16/16 CXR - denies  Echo - denies Stress test - 2013 Cardiac Cath - 2005  Patient denies chest pain and shortness of breath at PAT appointment.

## 2016-02-19 NOTE — Pre-Procedure Instructions (Addendum)
Jeffery Davis  02/19/2016      [facility] - [place], [place] - [address] [place] [address] Phone: [phone] Fax: [phone]    Your procedure is scheduled on Tuesday, December 19.  Report to [facility]mitting at 8:30AM                 Your surgery or procedure is scheduled for 10:30 AM   Call this number if you have problems the morning of surgery: [phone]          For any other questions, please call [phone], Monday - Friday 8 AM - 4 PM.   Remember:  Do not eat food or drink liquids after midnight Monday, December 18.   Take these medicines the morning of surgery with A SIP OF WATER: Omeprazole.                  1 Week prior to surgery STOP taking Aspirin, Aspirin Products (Goody Powder, Excedrin Migraine), Ibuprofen (Advil), Naproxen (Aleve), Vitiamins and Herbal Products (ie Fish Oil,Coenzyme Q10 (CO Q 10) ).   Do not wear jewelry.  Do not wear lotions, powders, or colognes, or deodorant.  Men may shave face and neck.  Do not bring valuables to the hospital.  [facility] is not responsible for any belongings or valuables.  Contacts, dentures or bridgework may not be worn into surgery.  Leave your suitcase in the car.  After surgery it may be brought to your room.  For patients admitted to the hospital, discharge time will be determined by your treatment team.  Patients discharged the day of surgery will not be allowed to drive home.   Special instructions: Preparing for Surgery.   [place]- Preparing For Surgery  Before surgery, you can play an important role. Because skin is not sterile, your skin needs to be as free of germs as possible. You can reduce the number of germs on your skin by washing with CHG (chlorahexidine gluconate) Soap before surgery.  CHG is an antiseptic cleaner which kills germs and bonds with the skin to continue killing germs even after washing.  Please do not use if you have an  allergy to CHG or antibacterial soaps. If your skin becomes reddened/irritated stop using the CHG.  Do not shave (including legs and underarms) for at least 48 hours prior to first CHG shower. It is OK to shave your face.  Please follow these instructions carefully.   1. Shower the NIGHT BEFORE SURGERY and the MORNING OF SURGERY with CHG.   2. If you chose to wash your hair, wash your hair first as usual with your normal shampoo.  3. After you shampoo, rinse your hair and body thoroughly to remove the shampoo.  4. Use CHG as you would any other liquid soap. You can apply CHG directly to the skin and wash gently with a scrungie or a clean washcloth.   5. Apply the CHG Soap to your body ONLY FROM THE NECK DOWN.  Do not use on open wounds or open sores. Avoid contact with your eyes, ears, mouth and genitals (private parts). Wash genitals (private parts) with your normal soap.  6. Wash thoroughly, paying special attention to the area where your surgery will be performed.  7. Thoroughly rinse your body with warm water from the neck down.  8. DO NOT shower/wash with your normal soap after using and rinsing off the CHG Soap.  9. Pat yourself dry with a CLEAN TOWEL.   10. Wear CLEAN PAJAMAS   11. Place CLEAN SHEETS on your bed the night of your first shower and DO NOT SLEEP WITH PETS.  Day of Surgery: Do not apply any deodorants/lotions. Please wear clean clothes to the hospital/surgery center.     Please read over the following fact sheets that you were given. [facility]- Preparing For Surgery and Patient Instructions for Mupirocin Application, Pain Booklet, Coughing and Deep Breathing

## 2016-02-24 DIAGNOSIS — L821 Other seborrheic keratosis: Secondary | ICD-10-CM | POA: Diagnosis not present

## 2016-02-24 DIAGNOSIS — L820 Inflamed seborrheic keratosis: Secondary | ICD-10-CM | POA: Diagnosis not present

## 2016-03-01 ENCOUNTER — Encounter (HOSPITAL_COMMUNITY): Payer: Self-pay | Admitting: *Deleted

## 2016-03-01 ENCOUNTER — Inpatient Hospital Stay (HOSPITAL_COMMUNITY): Payer: PPO | Admitting: Anesthesiology

## 2016-03-01 ENCOUNTER — Encounter (HOSPITAL_COMMUNITY)
Admission: RE | Disposition: A | Discharge status: Home / Self Care | Payer: Self-pay | Source: Ambulatory Visit | Attending: Orthopedic Surgery

## 2016-03-01 ENCOUNTER — Inpatient Hospital Stay (HOSPITAL_COMMUNITY): Payer: PPO

## 2016-03-01 ENCOUNTER — Inpatient Hospital Stay (HOSPITAL_COMMUNITY)
Admission: RE | Admit: 2016-03-01 | Discharge: 2016-03-02 | DRG: 483 | Disposition: A | Discharge status: Home / Self Care | Payer: PPO | Source: Ambulatory Visit | Attending: Orthopedic Surgery | Admitting: Orthopedic Surgery

## 2016-03-01 DIAGNOSIS — E785 Hyperlipidemia, unspecified: Secondary | ICD-10-CM | POA: Diagnosis not present

## 2016-03-01 DIAGNOSIS — M19012 Primary osteoarthritis, left shoulder: Secondary | ICD-10-CM | POA: Diagnosis not present

## 2016-03-01 DIAGNOSIS — Z79899 Other long term (current) drug therapy: Secondary | ICD-10-CM

## 2016-03-01 DIAGNOSIS — Z8249 Family history of ischemic heart disease and other diseases of the circulatory system: Secondary | ICD-10-CM

## 2016-03-01 DIAGNOSIS — Z96619 Presence of unspecified artificial shoulder joint: Secondary | ICD-10-CM

## 2016-03-01 DIAGNOSIS — Z471 Aftercare following joint replacement surgery: Secondary | ICD-10-CM | POA: Diagnosis not present

## 2016-03-01 DIAGNOSIS — Z96612 Presence of left artificial shoulder joint: Secondary | ICD-10-CM

## 2016-03-01 DIAGNOSIS — Z7982 Long term (current) use of aspirin: Secondary | ICD-10-CM

## 2016-03-01 DIAGNOSIS — K219 Gastro-esophageal reflux disease without esophagitis: Secondary | ICD-10-CM | POA: Diagnosis present

## 2016-03-01 DIAGNOSIS — G8918 Other acute postprocedural pain: Secondary | ICD-10-CM | POA: Diagnosis not present

## 2016-03-01 HISTORY — PX: TOTAL SHOULDER ARTHROPLASTY: SHX126

## 2016-03-01 HISTORY — DX: Primary osteoarthritis, left shoulder: M19.012

## 2016-03-01 SURGERY — ARTHROPLASTY, SHOULDER, TOTAL
Anesthesia: Regional | Site: Shoulder | Laterality: Left

## 2016-03-01 MED ORDER — POLYETHYLENE GLYCOL 3350 17 G PO PACK: 17.0000 g | PACK | Freq: Every day | ORAL | Status: DC | PRN

## 2016-03-01 MED ORDER — METHOCARBAMOL 500 MG PO TABS
500.0000 mg | ORAL_TABLET | Freq: Four times a day (QID) | ORAL | Status: DC | PRN
  Administered 2016-03-02 (×2): 500 mg via ORAL
  Filled 2016-03-01 (×2): qty 1

## 2016-03-01 MED ORDER — DEXTROSE 5 % IV SOLN
INTRAVENOUS | Status: DC | PRN
  Administered 2016-03-01: 20 ug/min via INTRAVENOUS

## 2016-03-01 MED ORDER — DIPHENHYDRAMINE HCL 12.5 MG/5ML PO ELIX: 12.5000 mg | ORAL_SOLUTION | ORAL | Status: DC | PRN

## 2016-03-01 MED ORDER — MAGNESIUM CITRATE PO SOLN: 1.0000 | Freq: Once | ORAL | Status: DC | PRN

## 2016-03-01 MED ORDER — METOCLOPRAMIDE HCL 5 MG/ML IJ SOLN: 5.0000 mg | Freq: Three times a day (TID) | INTRAMUSCULAR | Status: DC | PRN

## 2016-03-01 MED ORDER — LACTATED RINGERS IV SOLN
INTRAVENOUS | Status: DC
  Administered 2016-03-01 (×2): via INTRAVENOUS

## 2016-03-01 MED ORDER — POTASSIUM CHLORIDE IN NACL 20-0.45 MEQ/L-% IV SOLN
INTRAVENOUS | Status: DC
  Administered 2016-03-01: 18:00:00 via INTRAVENOUS
  Filled 2016-03-01 (×2): qty 1000

## 2016-03-01 MED ORDER — FENTANYL CITRATE (PF) 100 MCG/2ML IJ SOLN
INTRAMUSCULAR | Status: AC
  Filled 2016-03-01: qty 2

## 2016-03-01 MED ORDER — ONDANSETRON HCL 4 MG/2ML IJ SOLN
INTRAMUSCULAR | Status: DC | PRN
  Administered 2016-03-01: 4 mg via INTRAVENOUS

## 2016-03-01 MED ORDER — ASPIRIN 81 MG PO CHEW
81.0000 mg | CHEWABLE_TABLET | Freq: Every day | ORAL | Status: DC
  Administered 2016-03-01 – 2016-03-02 (×2): 81 mg via ORAL
  Filled 2016-03-01 (×2): qty 1

## 2016-03-01 MED ORDER — OXYCODONE HCL 5 MG PO TABS
5.0000 mg | ORAL_TABLET | ORAL | Status: DC | PRN
  Administered 2016-03-01: 5 mg via ORAL
  Administered 2016-03-02 (×4): 10 mg via ORAL
  Filled 2016-03-01 (×4): qty 2
  Filled 2016-03-01: qty 1

## 2016-03-01 MED ORDER — CO Q 10 10 MG PO CAPS: 1.0000 | ORAL_CAPSULE | Freq: Two times a day (BID) | ORAL | Status: DC

## 2016-03-01 MED ORDER — SENNA 8.6 MG PO TABS
1.0000 | ORAL_TABLET | Freq: Two times a day (BID) | ORAL | Status: DC
  Administered 2016-03-01 – 2016-03-02 (×2): 8.6 mg via ORAL
  Filled 2016-03-01 (×2): qty 1

## 2016-03-01 MED ORDER — ONDANSETRON HCL 4 MG PO TABS: 4.0000 mg | ORAL_TABLET | Freq: Four times a day (QID) | ORAL | Status: DC | PRN

## 2016-03-01 MED ORDER — SUGAMMADEX SODIUM 200 MG/2ML IV SOLN
INTRAVENOUS | Status: DC | PRN
  Administered 2016-03-01: 200 mg via INTRAVENOUS

## 2016-03-01 MED ORDER — BISACODYL 10 MG RE SUPP: 10.0000 mg | Freq: Every day | RECTAL | Status: DC | PRN

## 2016-03-01 MED ORDER — SUGAMMADEX SODIUM 200 MG/2ML IV SOLN
INTRAVENOUS | Status: AC
  Filled 2016-03-01: qty 4

## 2016-03-01 MED ORDER — FENTANYL CITRATE (PF) 100 MCG/2ML IJ SOLN: 25.0000 ug | INTRAMUSCULAR | Status: DC | PRN

## 2016-03-01 MED ORDER — OXYCODONE HCL 5 MG PO TABS: 5.0000 mg | ORAL_TABLET | ORAL | 0 refills | Status: DC | PRN

## 2016-03-01 MED ORDER — METOCLOPRAMIDE HCL 5 MG PO TABS: 5.0000 mg | ORAL_TABLET | Freq: Three times a day (TID) | ORAL | Status: DC | PRN

## 2016-03-01 MED ORDER — BUPIVACAINE-EPINEPHRINE (PF) 0.5% -1:200000 IJ SOLN
INTRAMUSCULAR | Status: DC | PRN
  Administered 2016-03-01: 25 mL via PERINEURAL

## 2016-03-01 MED ORDER — LIDOCAINE 2% (20 MG/ML) 5 ML SYRINGE
INTRAMUSCULAR | Status: AC
  Filled 2016-03-01: qty 10

## 2016-03-01 MED ORDER — PHENOL 1.4 % MT LIQD: 1.0000 | OROMUCOSAL | Status: DC | PRN

## 2016-03-01 MED ORDER — ACETAMINOPHEN 325 MG PO TABS: 650.0000 mg | ORAL_TABLET | Freq: Four times a day (QID) | ORAL | Status: DC | PRN

## 2016-03-01 MED ORDER — 0.9 % SODIUM CHLORIDE (POUR BTL) OPTIME
TOPICAL | Status: DC | PRN
  Administered 2016-03-01: 1000 mL

## 2016-03-01 MED ORDER — PROPOFOL 10 MG/ML IV BOLUS
INTRAVENOUS | Status: AC
  Filled 2016-03-01: qty 20

## 2016-03-01 MED ORDER — METHOCARBAMOL 1000 MG/10ML IJ SOLN
500.0000 mg | Freq: Four times a day (QID) | INTRAVENOUS | Status: DC | PRN
  Filled 2016-03-01: qty 5

## 2016-03-01 MED ORDER — ONDANSETRON HCL 4 MG PO TABS: 4.0000 mg | ORAL_TABLET | Freq: Three times a day (TID) | ORAL | 0 refills | Status: DC | PRN

## 2016-03-01 MED ORDER — LIDOCAINE HCL (CARDIAC) 20 MG/ML IV SOLN
INTRAVENOUS | Status: DC | PRN
  Administered 2016-03-01: 20 mg via INTRATRACHEAL

## 2016-03-01 MED ORDER — ATORVASTATIN CALCIUM 10 MG PO TABS
10.0000 mg | ORAL_TABLET | Freq: Every day | ORAL | Status: DC
  Administered 2016-03-01 – 2016-03-02 (×2): 10 mg via ORAL
  Filled 2016-03-01 (×2): qty 1

## 2016-03-01 MED ORDER — FENTANYL CITRATE (PF) 100 MCG/2ML IJ SOLN
INTRAMUSCULAR | Status: DC | PRN
  Administered 2016-03-01 (×2): 50 ug via INTRAVENOUS

## 2016-03-01 MED ORDER — ONDANSETRON HCL 4 MG/2ML IJ SOLN: 4.0000 mg | Freq: Four times a day (QID) | INTRAMUSCULAR | Status: DC | PRN

## 2016-03-01 MED ORDER — GLYCOPYRROLATE 0.2 MG/ML IJ SOLN
INTRAMUSCULAR | Status: DC | PRN
  Administered 2016-03-01: .2 mg via INTRAVENOUS

## 2016-03-01 MED ORDER — BACLOFEN 10 MG PO TABS: 10.0000 mg | ORAL_TABLET | Freq: Three times a day (TID) | ORAL | 0 refills | Status: DC

## 2016-03-01 MED ORDER — PROMETHAZINE HCL 25 MG/ML IJ SOLN: 6.2500 mg | INTRAMUSCULAR | Status: DC | PRN

## 2016-03-01 MED ORDER — EPHEDRINE SULFATE 50 MG/ML IJ SOLN
INTRAMUSCULAR | Status: DC | PRN
  Administered 2016-03-01: 10 mg via INTRAVENOUS

## 2016-03-01 MED ORDER — FENTANYL CITRATE (PF) 100 MCG/2ML IJ SOLN
50.0000 ug | Freq: Once | INTRAMUSCULAR | Status: AC
  Administered 2016-03-01: 50 ug via INTRAVENOUS

## 2016-03-01 MED ORDER — DOCUSATE SODIUM 100 MG PO CAPS
100.0000 mg | ORAL_CAPSULE | Freq: Two times a day (BID) | ORAL | Status: DC
  Administered 2016-03-01 – 2016-03-02 (×2): 100 mg via ORAL
  Filled 2016-03-01 (×2): qty 1

## 2016-03-01 MED ORDER — VITAMIN C 500 MG PO TABS
1000.0000 mg | ORAL_TABLET | Freq: Every day | ORAL | Status: DC
  Administered 2016-03-02: 1000 mg via ORAL
  Filled 2016-03-01: qty 2

## 2016-03-01 MED ORDER — MENTHOL 3 MG MT LOZG: 1.0000 | LOZENGE | OROMUCOSAL | Status: DC | PRN

## 2016-03-01 MED ORDER — SENNA-DOCUSATE SODIUM 8.6-50 MG PO TABS: 2.0000 | ORAL_TABLET | Freq: Every day | ORAL | 1 refills | Status: DC

## 2016-03-01 MED ORDER — HYDROMORPHONE HCL 2 MG/ML IJ SOLN
0.5000 mg | INTRAMUSCULAR | Status: DC | PRN
  Administered 2016-03-02 (×2): 0.5 mg via INTRAVENOUS
  Filled 2016-03-01 (×2): qty 1

## 2016-03-01 MED ORDER — ONDANSETRON HCL 4 MG/2ML IJ SOLN
INTRAMUSCULAR | Status: AC
  Filled 2016-03-01: qty 4

## 2016-03-01 MED ORDER — CEFAZOLIN SODIUM-DEXTROSE 2-4 GM/100ML-% IV SOLN
2.0000 g | INTRAVENOUS | Status: AC
  Administered 2016-03-01: 2 g via INTRAVENOUS
  Filled 2016-03-01: qty 100

## 2016-03-01 MED ORDER — FENTANYL CITRATE (PF) 100 MCG/2ML IJ SOLN
INTRAMUSCULAR | Status: AC
  Administered 2016-03-01: 50 ug via INTRAVENOUS
  Filled 2016-03-01: qty 2

## 2016-03-01 MED ORDER — PANTOPRAZOLE SODIUM 40 MG PO TBEC
80.0000 mg | DELAYED_RELEASE_TABLET | Freq: Every day | ORAL | Status: DC
  Administered 2016-03-02: 80 mg via ORAL
  Filled 2016-03-01: qty 2

## 2016-03-01 MED ORDER — PHENYLEPHRINE 40 MCG/ML (10ML) SYRINGE FOR IV PUSH (FOR BLOOD PRESSURE SUPPORT)
PREFILLED_SYRINGE | INTRAVENOUS | Status: AC
  Filled 2016-03-01: qty 10

## 2016-03-01 MED ORDER — EPHEDRINE 5 MG/ML INJ
INTRAVENOUS | Status: AC
  Filled 2016-03-01: qty 10

## 2016-03-01 MED ORDER — ACETAMINOPHEN 650 MG RE SUPP: 650.0000 mg | Freq: Four times a day (QID) | RECTAL | Status: DC | PRN

## 2016-03-01 MED ORDER — PROPOFOL 10 MG/ML IV BOLUS
INTRAVENOUS | Status: DC | PRN
  Administered 2016-03-01: 150 mg via INTRAVENOUS

## 2016-03-01 MED ORDER — ALUM & MAG HYDROXIDE-SIMETH 200-200-20 MG/5ML PO SUSP: 30.0000 mL | ORAL | Status: DC | PRN

## 2016-03-01 MED ORDER — ROCURONIUM BROMIDE 50 MG/5ML IV SOSY
PREFILLED_SYRINGE | INTRAVENOUS | Status: AC
  Filled 2016-03-01: qty 10

## 2016-03-01 MED ORDER — MIDAZOLAM HCL 2 MG/2ML IJ SOLN
2.0000 mg | Freq: Once | INTRAMUSCULAR | Status: AC
  Administered 2016-03-01: 2 mg via INTRAVENOUS

## 2016-03-01 MED ORDER — VITAMIN D3 25 MCG (1000 UNIT) PO TABS
5000.0000 [IU] | ORAL_TABLET | Freq: Every day | ORAL | Status: DC
  Administered 2016-03-02: 5000 [IU] via ORAL
  Filled 2016-03-01 (×2): qty 5

## 2016-03-01 MED ORDER — MIDAZOLAM HCL 2 MG/2ML IJ SOLN
INTRAMUSCULAR | Status: AC
  Administered 2016-03-01: 2 mg via INTRAVENOUS
  Filled 2016-03-01: qty 2

## 2016-03-01 MED ORDER — CEFAZOLIN IN D5W 1 GM/50ML IV SOLN
1.0000 g | Freq: Four times a day (QID) | INTRAVENOUS | Status: AC
  Administered 2016-03-01 – 2016-03-02 (×3): 1 g via INTRAVENOUS
  Filled 2016-03-01 (×3): qty 50

## 2016-03-01 MED ORDER — CAPSAICIN 0.025 % EX CREA
1.0000 "application " | TOPICAL_CREAM | Freq: Every day | CUTANEOUS | Status: DC | PRN
  Filled 2016-03-01 (×2): qty 60

## 2016-03-01 MED ORDER — ROCURONIUM BROMIDE 100 MG/10ML IV SOLN
INTRAVENOUS | Status: DC | PRN
  Administered 2016-03-01: 10 mg via INTRAVENOUS
  Administered 2016-03-01: 40 mg via INTRAVENOUS

## 2016-03-01 SURGICAL SUPPLY — 53 items
BIT DRILL F/CENTRAL SCRW 3.2 (BIT) ×1
BIT DRILL F/CENTRAL SCRW 3.2MM (BIT) ×1 IMPLANT
BIT DRILL TWIST 2.7 (BIT) ×2 IMPLANT
BLADE SAW SGTL MED 73X18.5 STR (BLADE) ×2 IMPLANT
CAPT SHLDR REVTOTAL 2 ×2 IMPLANT
CLSR STERI-STRIP ANTIMIC 1/2X4 (GAUZE/BANDAGES/DRESSINGS) ×2 IMPLANT
COVER SURGICAL LIGHT HANDLE (MISCELLANEOUS) ×2 IMPLANT
DRAPE ORTHO SPLIT 77X108 STRL (DRAPES) ×2
DRAPE PROXIMA HALF (DRAPES) ×2 IMPLANT
DRAPE SURG ORHT 6 SPLT 77X108 (DRAPES) ×2 IMPLANT
DRAPE U-SHAPE 47X51 STRL (DRAPES) ×2 IMPLANT
DRILL BIT F/CENTRAL SCRW 3.2MM (BIT) ×1
DRSG MEPILEX BORDER 4X8 (GAUZE/BANDAGES/DRESSINGS) ×2 IMPLANT
DURAPREP 26ML APPLICATOR (WOUND CARE) ×2 IMPLANT
ELECT REM PT RETURN 9FT ADLT (ELECTROSURGICAL) ×2
ELECTRODE REM PT RTRN 9FT ADLT (ELECTROSURGICAL) ×1 IMPLANT
GLOVE BIOGEL PI ORTHO PRO SZ8 (GLOVE) ×2
GLOVE ORTHO TXT STRL SZ7.5 (GLOVE) ×2 IMPLANT
GLOVE PI ORTHO PRO STRL SZ8 (GLOVE) ×2 IMPLANT
GLOVE SURG ORTHO 8.0 STRL STRW (GLOVE) ×2 IMPLANT
GOWN STRL REUS W/ TWL XL LVL3 (GOWN DISPOSABLE) ×1 IMPLANT
GOWN STRL REUS W/TWL 2XL LVL3 (GOWN DISPOSABLE) ×2 IMPLANT
GOWN STRL REUS W/TWL XL LVL3 (GOWN DISPOSABLE) ×1
HANDPIECE INTERPULSE COAX TIP (DISPOSABLE) ×1
HOOD PEEL AWAY FACE SHEILD DIS (HOOD) ×4 IMPLANT
KIT BASIN OR (CUSTOM PROCEDURE TRAY) ×2 IMPLANT
KIT ROOM TURNOVER OR (KITS) ×2 IMPLANT
MANIFOLD NEPTUNE II (INSTRUMENTS) ×2 IMPLANT
NS IRRIG 1000ML POUR BTL (IV SOLUTION) ×2 IMPLANT
PACK SHOULDER (CUSTOM PROCEDURE TRAY) ×2 IMPLANT
PAD ARMBOARD 7.5X6 YLW CONV (MISCELLANEOUS) ×4 IMPLANT
PIN THREADED REVERSE (PIN) ×2 IMPLANT
SET HNDPC FAN SPRY TIP SCT (DISPOSABLE) ×1 IMPLANT
SLING ARM IMMOBILIZER LRG (SOFTGOODS) ×2 IMPLANT
SLING ARM IMMOBILIZER MED (SOFTGOODS) IMPLANT
SMARTMIX MINI TOWER (MISCELLANEOUS)
SPONGE LAP 18X18 X RAY DECT (DISPOSABLE) ×2 IMPLANT
SUCTION FRAZIER HANDLE 10FR (MISCELLANEOUS) ×1
SUCTION TUBE FRAZIER 10FR DISP (MISCELLANEOUS) ×1 IMPLANT
SUPPORT WRAP ARM LG (MISCELLANEOUS) ×2 IMPLANT
SUT FIBERWIRE #2 38 REV NDL BL (SUTURE) ×12
SUT MNCRL AB 4-0 PS2 18 (SUTURE) IMPLANT
SUT VIC AB 0 CT1 27 (SUTURE) ×2
SUT VIC AB 0 CT1 27XBRD ANBCTR (SUTURE) ×2 IMPLANT
SUT VIC AB 2-0 CT1 27 (SUTURE) ×1
SUT VIC AB 2-0 CT1 TAPERPNT 27 (SUTURE) ×1 IMPLANT
SUT VIC AB 3-0 SH 8-18 (SUTURE) ×2 IMPLANT
SUTURE FIBERWR#2 38 REV NDL BL (SUTURE) ×6 IMPLANT
TOWEL OR 17X24 6PK STRL BLUE (TOWEL DISPOSABLE) ×2 IMPLANT
TOWEL OR 17X26 10 PK STRL BLUE (TOWEL DISPOSABLE) ×2 IMPLANT
TOWER SMARTMIX MINI (MISCELLANEOUS) IMPLANT
TUBE CONNECTING 12X1/4 (SUCTIONS) IMPLANT
YANKAUER SUCT BULB TIP NO VENT (SUCTIONS) ×2 IMPLANT

## 2016-03-01 NOTE — Anesthesia Postprocedure Evaluation (Signed)
Anesthesia Post Note  Patient: Jeffery Davis  Procedure(s) Performed: Procedure(s) (LRB): REVERSE TOTAL SHOULDER ARTHROPLASTY (Left)  Patient location during evaluation: PACU Anesthesia Type: General and Regional Level of consciousness: awake and alert Pain management: pain level controlled Vital Signs Assessment: post-procedure vital signs reviewed and stable Respiratory status: spontaneous breathing, nonlabored ventilation, respiratory function stable and patient connected to nasal cannula oxygen Cardiovascular status: blood pressure returned to baseline and stable Postop Assessment: no signs of nausea or vomiting Anesthetic complications: no       Last Vitals:  Vitals:   03/01/16 1305 03/01/16 1320  BP: (!) 144/85 (!) 154/95  Pulse: 60 61  Resp: 12 17  Temp: 36.7 C     Last Pain:  Vitals:   03/01/16 0843  TempSrc: Leonard Schwartz

## 2016-03-01 NOTE — Anesthesia Procedure Notes (Signed)
Procedure Name: Intubation Date/Time: 03/01/2016 10:38 AM Performed by: Mariea Clonts Pre-anesthesia Checklist: Patient identified, Emergency Drugs available, Suction available and Patient being monitored Patient Re-evaluated:Patient Re-evaluated prior to inductionOxygen Delivery Method: Circle System Utilized Preoxygenation: Pre-oxygenation with 100% oxygen Intubation Type: IV induction Ventilation: Mask ventilation without difficulty Laryngoscope Size: Miller and 2 Grade View: Grade II Tube type: Oral Tube size: 7.5 mm Number of attempts: 1 Airway Equipment and Method: Stylet and Oral airway Placement Confirmation: ETT inserted through vocal cords under direct vision,  positive ETCO2 and breath sounds checked- equal and bilateral Tube secured with: Tape Dental Injury: Teeth and Oropharynx as per pre-operative assessment

## 2016-03-01 NOTE — H&P (Signed)
PREOPERATIVE H&P  Chief Complaint: DJD LEFT SHOULDER  HPI: Jeffery Davis is a 70 y.o. male who presents for preoperative history and physical with a diagnosis of DJD LEFT SHOULDER. Symptoms are rated as moderate to severe, and have been worsening.  This is significantly impairing activities of daily living.  He has elected for surgical management. Pain is rated 9/10. He is even had intra-articular injections with ultrasound guidance. He wants to be able to golf and fish. Significant pain at night.  He has failed injections, activity modification, anti-inflammatories, and assistive devices.  Preoperative X-rays demonstrate end stage degenerative changes with osteophyte formation, loss of joint space, subchondral sclerosis.   Past Medical History:  Diagnosis Date  . Actinic keratitis   . Allergy   . Arthritis    degenerative  . Benign fibroma of prostate   . Erectile dysfunction   . GERD (gastroesophageal reflux disease)   . Hyperlipidemia    Past Surgical History:  Procedure Laterality Date  . CARDIAC CATHETERIZATION  2005   [facility]  . CATARACT EXTRACTION  06/2002  . COLONOSCOPY    . ESOPHAGOGASTRODUODENOSCOPY (EGD) WITH PROPOFOL N/A 01/08/2016   Procedure: ESOPHAGOGASTRODUODENOSCOPY (EGD) WITH PROPOFOL;  Surgeon: Manya Silvas, MD;  Location: [facility] ENDOSCOPY;  Service: Endoscopy;  Laterality: N/A;  . KNEE ARTHROSCOPY Right   . SHOULDER SURGERY Left 2010   Social History   Social History  . Marital status: Married    Spouse name: Coralyn Mark  . Number of children: 2  . Years of education: bachelors   Occupational History  . Retired      worked at [employer] 12 years.   .  [employer] [place] Topics  . Smoking status: Never Smoker  . Smokeless tobacco: Never Used  . Alcohol use 0.6 oz/week    1 Standard drinks or equivalent per week     Comment: 1-2 glasses wine 2-3 times a week  . Drug use: No  . Sexual activity: Not  Asked   Other Topics Concern  . None   Social History Narrative  . None   Family History  Problem Relation Age of Onset  . Cancer Mother     breast and pancreatic  . CAD Father   . Hypertension Father   . Healthy Daughter   . Healthy Son    No Known Allergies Prior to Admission medications   Medication Sig Start Date End Date Taking? Authorizing Provider  acetaminophen (TYLENOL) 500 MG tablet Take 1,000 mg by mouth every 6 (six) hours as needed.   Yes Historical Provider, MD  Ascorbic Acid (VITAMIN C) 1000 MG tablet Take 1,000 mg by mouth daily.   Yes Historical Provider, MD  aspirin 81 MG tablet Take 81 mg by mouth daily.    Yes Historical Provider, MD  Aspirin-Acetaminophen-Caffeine (EXCEDRIN PO) Take 2 tablets by mouth daily as needed (HEADACHES).   Yes Historical Provider, MD  atorvastatin (LIPITOR) 10 MG tablet Take 1 tablet (10 mg total) by mouth daily. Patient taking differently: Take 10 mg by mouth daily at 6 PM.  02/09/16  Yes Jerrol Banana., MD  Capsaicin 0.1 % CREA Apply 1 application topically daily as needed (shoulder pain).   Yes Historical Provider, MD  Cholecalciferol (VITAMIN D3) 5000 units TABS Take 5,000 Units by mouth daily.   Yes Historical Provider, MD  Coenzyme Q10 (CO Q 10) 10 MG CAPS Take 1 capsule by mouth 2 (two) times daily.  Yes Historical Provider, MD  glucosamine-chondroitin 500-400 MG tablet Take 1 tablet by mouth 2 (two) times daily.    Yes Historical Provider, MD  Omeprazole 20 MG TBEC Take 1 tablet (20 mg total) by mouth daily. 04/02/15  Yes Richard Maceo Pro., MD  sildenafil (REVATIO) 20 MG tablet Take 20 mg by mouth daily as needed (ED).    Yes Historical Provider, MD     Positive ROS: All other systems have been reviewed and were otherwise negative with the exception of those mentioned in the HPI and as above.  Physical Exam: General: Alert, no acute distress Cardiovascular: No pedal edema Respiratory: No cyanosis, no use of  accessory musculature GI: No organomegaly, abdomen is soft and non-tender Skin: No lesions in the area of chief complaint Neurologic: Sensation intact distally Psychiatric: Patient is competent for consent with normal mood and affect Lymphatic: No axillary or cervical lymphadenopathy  MUSCULOSKELETAL: Left shoulder active motion 0-170 with external rotation to neutral. Cuff strength intact, moderate crepitance, pain throughout his arc of motion.  Assessment: DJD LEFT SHOULDER   Plan: Plan for Procedure(s): TOTAL SHOULDER ARTHROPLASTY  The risks benefits and alternatives were discussed with the patient including but not limited to the risks of nonoperative treatment, versus surgical intervention including infection, bleeding, nerve injury,  blood clots, cardiopulmonary complications, morbidity, mortality, among others, and they were willing to proceed.   Johnny Bridge, MD Cell [phone]   03/01/2016 9:43 AM

## 2016-03-01 NOTE — Progress Notes (Signed)
Orthopedic Tech Progress Note Patient Details:  Jeffery Davis 1945/10/05 [id number] Patient has arm sling already. Patient ID: [name], male   DOB: October 03, 1945, 70 y.o.   MRN: [id number]   Braulio Bosch 03/01/2016, 5:14 PM

## 2016-03-01 NOTE — Anesthesia Preprocedure Evaluation (Addendum)
Anesthesia Evaluation  Patient identified by MRN, date of birth, ID band Patient awake    Reviewed: Allergy & Precautions, NPO status , Patient's Chart, lab work & pertinent test results  Airway Mallampati: II  TM Distance: >3 FB Neck ROM: Full    Dental  (+) Dental Advisory Given   Pulmonary neg pulmonary ROS,    breath sounds clear to auscultation       Cardiovascular negative cardio ROS   Rhythm:Regular Rate:Normal     Neuro/Psych negative neurological ROS     GI/Hepatic Neg liver ROS, GERD  ,  Endo/Other  negative endocrine ROS  Renal/GU negative Renal ROS     Musculoskeletal  (+) Arthritis ,   Abdominal   Peds  Hematology negative hematology ROS (+)   Anesthesia Other Findings   Reproductive/Obstetrics                            Lab Results  Component Value Date   WBC 5.1 02/19/2016   HGB 12.7 (L) 02/19/2016   HCT 38.3 (L) 02/19/2016   MCV 91.4 02/19/2016   PLT 301 02/19/2016   Lab Results  Component Value Date   CREATININE 0.82 02/19/2016   BUN 23 (H) 02/19/2016   NA 139 02/19/2016   K 4.1 02/19/2016   CL 106 02/19/2016   CO2 24 02/19/2016    Anesthesia Physical Anesthesia Plan  ASA: II  Anesthesia Plan: General and Regional   Post-op Pain Management:    Induction: Intravenous  Airway Management Planned: Oral ETT  Additional Equipment:   Intra-op Plan:   Post-operative Plan: Extubation in OR  Informed Consent: I have reviewed the patients History and Physical, chart, labs and discussed the procedure including the risks, benefits and alternatives for the proposed anesthesia with the patient or authorized representative who has indicated his/her understanding and acceptance.   Dental advisory given  Plan Discussed with: CRNA  Anesthesia Plan Comments:         Anesthesia Quick Evaluation

## 2016-03-01 NOTE — Transfer of Care (Signed)
Immediate Anesthesia Transfer of Care Note  Patient: Jeffery Davis  Procedure(s) Performed: Procedure(s): REVERSE TOTAL SHOULDER ARTHROPLASTY (Left)  Patient Location: PACU  Anesthesia Type:GA combined with regional for post-op pain  Level of Consciousness: awake, alert  and oriented  Airway & Oxygen Therapy: Patient Spontanous Breathing and Patient connected to nasal cannula oxygen  Post-op Assessment: Report given to RN, Post -op Vital signs reviewed and stable and Patient moving all extremities X 4  Post vital signs: Reviewed and stable  Last Vitals:  Vitals:   03/01/16 1305 03/01/16 1320  BP: (!) 144/85 (!) 154/95  Pulse: 60 61  Resp: 12 17  Temp: 36.7 C     Last Pain:  Vitals:   03/01/16 0843  TempSrc: Oral         Complications: No apparent anesthesia complications

## 2016-03-01 NOTE — Discharge Instructions (Signed)

## 2016-03-01 NOTE — Anesthesia Procedure Notes (Signed)
Anesthesia Regional Block:  Interscalene brachial plexus block  Pre-Anesthetic Checklist: ,, timeout performed, Correct Patient, Correct Site, Correct Laterality, Correct Procedure, Correct Position, site marked, Risks and benefits discussed,  Surgical consent,  Pre-op evaluation,  At surgeon's request and post-op pain management  Laterality: Left  Prep: chloraprep       Needles:  Injection technique: Single-shot  Needle Type: Echogenic Needle     Needle Length: 9cm 9 cm Needle Gauge: 21 and 21 G    Additional Needles:  Procedures: ultrasound guided (picture in chart) and nerve stimulator Interscalene brachial plexus block  Nerve Stimulator or Paresthesia:  Response: deltoid, 0.5 mA,   Additional Responses:   Narrative:  Start time: 03/01/2016 9:30 AM End time: 03/01/2016 9:37 AM Injection made incrementally with aspirations every 5 mL.  Performed by: Personally  Anesthesiologist: Suzette Battiest

## 2016-03-02 ENCOUNTER — Encounter (HOSPITAL_COMMUNITY): Payer: Self-pay | Admitting: Orthopedic Surgery

## 2016-03-02 ENCOUNTER — Telehealth: Payer: Self-pay | Admitting: Family Medicine

## 2016-03-02 LAB — BASIC METABOLIC PANEL
Anion gap: 6 (ref 5–15)
BUN: 13 mg/dL (ref 6–20)
CO2: 25 mmol/L (ref 22–32)
Calcium: 8.4 mg/dL — ABNORMAL LOW (ref 8.9–10.3)
Chloride: 102 mmol/L (ref 101–111)
Creatinine, Ser: 0.82 mg/dL (ref 0.61–1.24)
GFR calc Af Amer: >60 mL/min (ref >60)
GFR calc non Af Amer: >60 mL/min (ref >60)
Glucose, Bld: 122 mg/dL — ABNORMAL HIGH (ref 65–99)
Potassium: 3.7 mmol/L (ref 3.5–5.1)
Sodium: 133 mmol/L — ABNORMAL LOW (ref 135–145)

## 2016-03-02 LAB — CBC
HCT: 34.4 % — ABNORMAL LOW (ref 39.0–52.0)
Hemoglobin: 11.4 g/dL — ABNORMAL LOW (ref 13.0–17.0)
MCH: 30.4 pg (ref 26.0–34.0)
MCHC: 33.1 g/dL (ref 30.0–36.0)
MCV: 91.7 fL (ref 78.0–100.0)
Platelets: 223 10*3/uL (ref 150–400)
RBC: 3.75 MIL/uL — ABNORMAL LOW (ref 4.22–5.81)
RDW: 14.2 % (ref 11.5–15.5)
WBC: 8.6 10*3/uL (ref 4.0–10.5)

## 2016-03-02 NOTE — Telephone Encounter (Signed)
Jeffery Davis with [facility] called to let Dr. Rosanna Randy know pt was discharged from [facility] today from having shoulder surgery. Jeffery Davis advised for our office to contact the pt to schedule a hospital follow up visit with Dr. Rosanna Randy in 7 to 14 days. Jeffery Davis stated that pt already has his f/u visit scheduled with his surgeon. Please advise. Thanks TNP

## 2016-03-02 NOTE — Progress Notes (Signed)
PT Cancellation and Discharge Note  Patient Details Name: Jeffery Davis MRN: [id number] DOB: May 15, 1945   Cancelled Treatment:    Reason Eval/Treat Not Completed: PT screened, no needs identified, will sign off   Discussed case with Mickel Baas, OT, who indicated no PT needs;   Roney Marion, [place]  Acute Rehabilitation Services Pager [phone] Office [phone]    Colletta Maryland 03/02/2016, 10:26 AM

## 2016-03-02 NOTE — Telephone Encounter (Signed)
He does not need to see [place] for follow-up for his shoulder replacement, just his  surgeon

## 2016-03-02 NOTE — Consult Note (Addendum)
     [facility] CM Primary Care Navigator  03/02/2016  Jeffery Davis [birth year] [id number]   Went to see patient at the bedside to identify possible discharge needsbut he had beendischargedper nursing staff.  Patient was discharged home today. Primary care provider's office called (Teresa)to notify of patient's discharge and possible need for post hospital follow-up and transition of care.   For additional questions please contact:  Edwena Felty A. Cotton Beckley, BSN, RN-BC [facility] PRIMARY CARE Navigator Cell: [phone]

## 2016-03-02 NOTE — Discharge Summary (Signed)
Physician Discharge Summary  Patient ID: Jeffery Davis MRN: [id number] DOB/AGE: 10/27/1945 70 y.o.  Admit date: 03/01/2016 Discharge date: 03/02/2016  Admission Diagnoses:  Primary localized osteoarthrosis of left shoulder  Discharge Diagnoses:  Principal Problem:   Primary localized osteoarthrosis of left shoulder Active Problems:   S/P shoulder replacement   Past Medical History:  Diagnosis Date  . Actinic keratitis   . Allergy   . Arthritis    degenerative  . Benign fibroma of prostate   . Erectile dysfunction   . GERD (gastroesophageal reflux disease)   . Hyperlipidemia   . Primary localized osteoarthrosis of left shoulder 03/01/2016    Surgeries: Procedure(s): REVERSE TOTAL SHOULDER ARTHROPLASTY on 03/01/2016   Consultants (if any):   Discharged Condition: Improved  Hospital Course: Jeffery Davis is an 70 y.o. male who was admitted 03/01/2016 with a diagnosis of Primary localized osteoarthrosis of left shoulder and went to the operating room on 03/01/2016 and underwent the above named procedures.    He was given perioperative antibiotics:  Anti-infectives    Start     Dose/Rate Route Frequency Ordered Stop   03/01/16 1800  ceFAZolin (ANCEF) IVPB 1 g/50 mL premix     1 g 100 mL/hr over 30 Minutes Intravenous Every 6 hours 03/01/16 1655 03/02/16 0530   03/01/16 0855  ceFAZolin (ANCEF) IVPB 2g/100 mL premix     2 g 200 mL/hr over 30 Minutes Intravenous On call to O.R. 03/01/16 [id number] 03/01/16 1041    .  He was given sequential compression devices, early ambulation,  for DVT prophylaxis.  He benefited maximally from the hospital stay and there were no complications.  He had some Persistent paresthesias in the left upper extremity in the dorsum of the hand postoperatively, I suspect this is still from the block, his radial nerve and motor function of his hand was completely intact. Also his deltoid was firing with 0-30 of motion.  Recent vital signs:  Vitals:    03/02/16 0018 03/02/16 0455  BP: 118/66 (!) 99/57  Pulse: 71 67  Resp: 16 16  Temp: 99.8 F (37.7 C) 99.1 F (37.3 C)    Recent laboratory studies:  Lab Results  Component Value Date   HGB 11.4 (L) 03/02/2016   HGB 12.7 (L) 02/19/2016   HGB 13.8 10/23/2013   Lab Results  Component Value Date   WBC 8.6 03/02/2016   PLT 223 03/02/2016   No results found for: INR Lab Results  Component Value Date   NA 133 (L) 03/02/2016   K 3.7 03/02/2016   CL 102 03/02/2016   CO2 25 03/02/2016   BUN 13 03/02/2016   CREATININE 0.82 03/02/2016   GLUCOSE 122 (H) 03/02/2016    Discharge Medications:   Allergies as of 03/02/2016   No Known Allergies     Medication List    STOP taking these medications   TYLENOL 500 MG tablet Generic drug:  acetaminophen     TAKE these medications   aspirin 81 MG tablet Take 81 mg by mouth daily.   atorvastatin 10 MG tablet Commonly known as:  LIPITOR Take 1 tablet (10 mg total) by mouth daily. What changed:  when to take this   baclofen 10 MG tablet Commonly known as:  LIORESAL Take 1 tablet (10 mg total) by mouth 3 (three) times daily. As needed for muscle spasm   Capsaicin 0.1 % Crea Apply 1 application topically daily as needed (shoulder pain).   Co Q 10 10 MG  Caps Take 1 capsule by mouth 2 (two) times daily.   EXCEDRIN PO Take 2 tablets by mouth daily as needed (HEADACHES).   glucosamine-chondroitin 500-400 MG tablet Take 1 tablet by mouth 2 (two) times daily.   Omeprazole 20 MG Tbec Take 1 tablet (20 mg total) by mouth daily.   ondansetron 4 MG tablet Commonly known as:  ZOFRAN Take 1 tablet (4 mg total) by mouth every 8 (eight) hours as needed for nausea or vomiting.   oxyCODONE 5 MG immediate release tablet Commonly known as:  ROXICODONE Take 1-2 tablets (5-10 mg total) by mouth every 4 (four) hours as needed for severe pain.   sennosides-docusate sodium 8.6-50 MG tablet Commonly known as:  SENOKOT-S Take 2 tablets  by mouth daily.   sildenafil 20 MG tablet Commonly known as:  REVATIO Take 20 mg by mouth daily as needed (ED).   vitamin C 1000 MG tablet Take 1,000 mg by mouth daily.   Vitamin D3 5000 units Tabs Take 5,000 Units by mouth daily.       Diagnostic Studies: Dg Shoulder Left Port  Result Date: 03/01/2016 CLINICAL DATA:  70 year old male with a history of postoperative left shoulder EXAM: LEFT SHOULDER - 1 VIEW COMPARISON:  MRI 01/26/2016, plain film 11/12/2015 FINDINGS: Single portable plain film left shoulder demonstrates early postoperative changes of left shoulder arthroplasty. Gas within the soft tissues. Components appear relatively aligned. IMPRESSION: Single portable plain film status post left shoulder arthroplasty with gas in the surgical bed, and relative alignment of the components. If there is concern for malalignment, a formal series would be useful. Signed, Dulcy Fanny. Earleen Newport, DO [facility] Electronically Signed   By: Corrie Mckusick D.O.   On: 03/01/2016 15:13    Disposition: 01-Home or Self Care    Follow-up Information    Shaunette Gassner P, MD. Schedule an appointment as soon as possible for a visit in 2 weeks.   Specialty:  Orthopedic Surgery Contact information: [address] [place] [id number] [phone]            Signed: Johnny Bridge 03/02/2016, 8:27 AM

## 2016-03-02 NOTE — Evaluation (Signed)
Occupational Therapy Evaluation and Discharge Patient Details Name: Jeffery Davis MRN: [id number] DOB: 1945/09/12 Today's Date: 03/02/2016    History of Present Illness 70 y/o male presenting post-op from left total shoulder arthroplasty. Pt has a past medical history of Actinic keratitis; Allergy; Arthritis; Benign fibroma of prostate; Erectile dysfunction; GERD; Hyperlipidemia; and Primary localized osteoarthrosis of left shoulder (03/01/2016). Pt  has a past surgical history that includes Shoulder surgery (Left, 2010); Cataract extraction (06/2002); Knee arthroscopy (Right); Esophagogastroduodenoscopy (egd) with propofol (01/08/2016); Colonoscopy; and Cardiac catheterization (2005).   Clinical Impression   PTA Pt independent in ADL/IADL and mobility. Pt is active, driving, and loving retirement. Pt currently max assist for bilateral ADL, and modified independent with ambulation. Pt eager and open for all education on sling, showering, HEP, and compensatory strategies. Please see performance level below. Wife entered at end of session, and OT reviewed information as well. No questions or concerns from either Pt or wife. OT to sign off at this time with rehab of shoulder as ordered at MD follow up. Thank you for this referral.     Follow Up Recommendations  Supervision - Intermittent    Equipment Recommendations  None recommended by OT    Recommendations for Other Services       Precautions / Restrictions Precautions Precautions: Shoulder Type of Shoulder Precautions: Conservative Protocol Shoulder Interventions: Shoulder sling/immobilizer;At all times;Off for dressing/bathing/exercises Precaution Booklet Issued: Yes (comment) Precaution Comments: OT shoulder handout reviewed in full Required Braces or Orthoses: Sling Restrictions Weight Bearing Restrictions: Yes LUE Weight Bearing: Non weight bearing      Mobility Bed Mobility Overal bed mobility: Needs Assistance Bed  Mobility: Supine to Sit     Supine to sit: Supervision;HOB elevated     General bed mobility comments: supervision for safety, nmo assist needed  Transfers Overall transfer level: Modified independent Equipment used: None             General transfer comment: Pt with increased time required, no physical assist needed    Balance Overall balance assessment: No apparent balance deficits (not formally assessed)                                          ADL Overall ADL's : Needs assistance/impaired                                       General ADL Comments: Pt with deficits in ADL related to shoulder surgery, please see section below for shoulder related functions. Pt able to perform sink level grooming and OT educated on NWB limitations.      Vision Vision Assessment?: No apparent visual deficits   Perception     Praxis      Pertinent Vitals/Pain Pain Assessment: 0-10 Pain Score: 7  Pain Location: L shoulder Pain Descriptors / Indicators: Dull Pain Intervention(s): Monitored during session;Repositioned;Ice applied     Hand Dominance Right   Extremity/Trunk Assessment Upper Extremity Assessment Upper Extremity Assessment: LUE deficits/detail LUE Deficits / Details: post op deficits in strength and ROM   Lower Extremity Assessment Lower Extremity Assessment: Overall WFL for tasks assessed   Cervical / Trunk Assessment Cervical / Trunk Assessment: Normal   Communication Communication Communication: No difficulties   Cognition Arousal/Alertness: Awake/alert Behavior During Therapy: WFL for tasks assessed/performed Overall  Cognitive Status: Within Functional Limits for tasks assessed                     General Comments       Exercises Exercises: Shoulder     Shoulder Instructions Shoulder Instructions Donning/doffing shirt without moving shoulder: Maximal assistance;Caregiver independent with task;Patient able  to independently direct caregiver Method for sponge bathing under operated UE: Supervision/safety Donning/doffing sling/immobilizer: Maximal assistance;Caregiver independent with task;Patient able to independently direct caregiver Correct positioning of sling/immobilizer: Modified independent ROM for elbow, wrist and digits of operated UE: Modified independent Sling wearing schedule (on at all times/off for ADL's): Modified independent Proper positioning of operated UE when showering: Minimal assistance;Caregiver independent with task;Patient able to independently direct caregiver Positioning of UE while sleeping: Modified independent;Patient able to independently direct caregiver;Caregiver independent with task    Home Living Family/patient expects to be discharged to:: Private residence Living Arrangements: Spouse/significant other Available Help at Discharge: Family;Available 24 hours/day Type of Home: House Home Access: Stairs to enter [employer] of Steps: 3   Home Layout: Two level Alternate Level Stairs-Number of Steps: flight   Bathroom Shower/Tub: Occupational psychologist: Standard     Home Equipment: [employer] - single point          Prior Functioning/Environment Level of Independence: Independent                 OT Problem List: Impaired UE functional use;Pain;Decreased strength;Decreased range of motion;Decreased activity tolerance   OT Treatment/Interventions:      OT Goals(Current goals can be found in the care plan section) Acute Rehab OT Goals Patient Stated Goal: to get back to golf and deep sea fishing OT Goal Formulation: With patient Time For Goal Achievement: 03/09/16 Potential to Achieve Goals: Good  OT Frequency:     Barriers to D/C:            Co-evaluation              End of Session Equipment Utilized During Treatment: Gait belt;Other (comment) (sling) Nurse Communication: Mobility status  Activity Tolerance:  Patient tolerated treatment well Patient left: in chair;with call bell/phone within reach;with family/visitor present   Time: [id number] OT Time Calculation (min): 38 min Charges:  OT General Charges $OT Visit: 1 Procedure OT Evaluation $OT Eval Moderate Complexity: 1 Procedure OT Treatments $Self Care/Home Management : 8-22 mins $Therapeutic Exercise: 8-22 mins G-Codes:    Merri Ray Rasean Joos 03/31/16, 11:33 AM  Hulda Humphrey OTR/L [phone]

## 2016-03-02 NOTE — Op Note (Signed)
03/01/2016  8:28 AM  PATIENT:  Jeffery Davis    PRE-OPERATIVE DIAGNOSIS:  DEGENERATIVE JOINT DISEASE LEFT SHOULDER  POST-OPERATIVE DIAGNOSIS:  Left shoulder rotator cuff arthropathy with scarred and dysfunctional supraspinatus  PROCEDURE:  REVERSE TOTAL SHOULDER ARTHROPLASTY  SURGEON:  Johnny Bridge, MD  PHYSICIAN ASSISTANT: Joya Gaskins, OPA-C, present and scrubbed throughout the case, critical for completion in a timely fashion, and for retraction, instrumentation, and closure.  ANESTHESIA:   General  PREOPERATIVE INDICATIONS:  Jeffery Davis is a  70 y.o. male who had severe left shoulder pain and dysfunction and had a history of a left-sided rotator cuff repair. That was an open repair. He had progressive pain and limitations in motion and elected for surgical management.  The risks benefits and alternatives were discussed with the patient preoperatively including but not limited to the risks of infection, bleeding, nerve injury, cardiopulmonary complications, the need for revision surgery, dislocation, brachial plexus palsy, incomplete relief of pain, among others, and the patient was willing to proceed.  OPERATIVE IMPLANTS: Biomet size 10 humeral stem press-fit standard with a 44 mm reverse shoulder arthroplasty tray with a standard liner and a 36 mm glenosphere with a standard baseplate and 4 locking screws and one central nonlocking screw.  OPERATIVE FINDINGS: There was extensive scarring in the subacromial space which I had to mobilize fairly aggressively. The supraspinatus was still adherent, however it was totally scarred in, not mobile, and did not appear functional. The upper border of the humeral head had acetabularized the undersurface of the acromion.  Mobilization of the humeral head for dislocation was extremely difficult. The biceps tendon was not present, and could not be tenodesed.  Reduction of the prosthesis used a 3 finger technique, it was slightly tight but had  appropriate mobility after all implants were in.  OPERATIVE PROCEDURE: The patient was brought to the operating room and placed in the supine position. General anesthesia was administered. IV antibiotics were given. Time out was performed. The upper extremity was prepped and draped in usual sterile fashion. The patient was in a beachchair position. Deltopectoral approach was carried out. The biceps was not present. The subscapularis was released off of the bone.   I then performed circumferential releases of the humerus, and then dislocated the head, and then reamed with the reamer to the above named size. Gaining access to the humeral head for placement of the reamer was fairly challenging, and the deltoid was in the way such that there was a slight amount of deltoid damage during exposure. The cephalic vein was not present.   I then applied the jig, and cut the humeral head in 30 of retroversion, and then turned my attention to the glenoid.  Deep retractors were placed, and I resected the labrum, and then placed a guidepin into the center position on the glenoid, with slight inferior inclination. I then reamed over the guidepin, and this created a small metaphyseal cancellus blush inferiorly, removing just the cartilage to the subchondral bone superiorly. The base plate was selected and impacted place, and then I secured it centrally with a nonlocking screw, and I had excellent purchase both inferiorly and superiorly. I placed a short locking screws on anterior and posterior aspects.  I then turned my attention to the glenosphere, and impacted this into place, placing slight inferior offset (set on B).   The glenosphere was completely seated, and had engagement of the [facility] taper. I then turned my attention back to the humerus.  I sequentially  broached, and then trialed, and was found to restore soft tissue tension, and it had 3 finger tightness. Therefore the above named components were selected.  The shoulder felt stable throughout functional motion.  Before I placed the real prosthesis I had also placed a total of 3 #2 FiberWire through drill holes in the humerus for later subscapularis repair.  I then impacted the real prosthesis into place, as well as the real humeral tray, and reduced the shoulder. The shoulder had excellent motion, and was stable, and I irrigated the wounds copiously.    I then used these to repair the subscapularis. This came down to bone.  I then irrigated the shoulder copiously once more, repaired the deltopectoral interval with Vicryl followed by subcutaneous Vicryl with Steri-Strips and sterile gauze for the skin. The patient was awakened and returned back in stable and satisfactory condition. There no complications and He tolerated the procedure well.

## 2016-03-02 NOTE — Telephone Encounter (Signed)
Please review. I did not think we did hospital follow up for shoulder surgery patient was cleared by [place] for this and wanted to get this done.-aa

## 2016-03-02 NOTE — Telephone Encounter (Signed)
Advised  ED 

## 2016-03-10 ENCOUNTER — Encounter (HOSPITAL_COMMUNITY): Payer: Self-pay

## 2016-03-10 ENCOUNTER — Other Ambulatory Visit (HOSPITAL_COMMUNITY): Payer: Self-pay | Admitting: Orthopedic Surgery

## 2016-03-10 ENCOUNTER — Ambulatory Visit (HOSPITAL_COMMUNITY)
Admission: RE | Admit: 2016-03-10 | Discharge: 2016-03-10 | Disposition: A | Discharge status: Home / Self Care | Payer: PPO | Source: Ambulatory Visit | Attending: Cardiology | Admitting: Cardiology

## 2016-03-10 DIAGNOSIS — R52 Pain, unspecified: Secondary | ICD-10-CM

## 2016-03-10 DIAGNOSIS — E785 Hyperlipidemia, unspecified: Secondary | ICD-10-CM | POA: Diagnosis not present

## 2016-03-10 DIAGNOSIS — I824Z2 Acute embolism and thrombosis of unspecified deep veins of left distal lower extremity: Secondary | ICD-10-CM | POA: Insufficient documentation

## 2016-03-10 DIAGNOSIS — Z96612 Presence of left artificial shoulder joint: Secondary | ICD-10-CM | POA: Diagnosis not present

## 2016-03-10 DIAGNOSIS — Z9889 Other specified postprocedural states: Secondary | ICD-10-CM | POA: Diagnosis not present

## 2016-03-10 DIAGNOSIS — M79662 Pain in left lower leg: Secondary | ICD-10-CM | POA: Diagnosis not present

## 2016-03-10 NOTE — Progress Notes (Signed)
Today's left lower extremity venous duplex is positive for DVT in the left soleal vein. Preliminary results given to Kirsten.

## 2016-03-16 DIAGNOSIS — M19012 Primary osteoarthritis, left shoulder: Secondary | ICD-10-CM | POA: Diagnosis not present

## 2016-03-24 ENCOUNTER — Ambulatory Visit (INDEPENDENT_AMBULATORY_CARE_PROVIDER_SITE_OTHER): Payer: PPO | Admitting: Family Medicine

## 2016-03-24 VITALS — BP 148/82 | HR 60 | Temp 97.8°F | Resp 14 | Wt 175.0 lb

## 2016-03-24 DIAGNOSIS — I824Y2 Acute embolism and thrombosis of unspecified deep veins of left proximal lower extremity: Secondary | ICD-10-CM

## 2016-03-24 DIAGNOSIS — Z96612 Presence of left artificial shoulder joint: Secondary | ICD-10-CM | POA: Diagnosis not present

## 2016-03-24 DIAGNOSIS — M5431 Sciatica, right side: Secondary | ICD-10-CM

## 2016-03-24 DIAGNOSIS — N529 Male erectile dysfunction, unspecified: Secondary | ICD-10-CM

## 2016-03-24 DIAGNOSIS — E7800 Pure hypercholesterolemia, unspecified: Secondary | ICD-10-CM

## 2016-03-24 MED ORDER — RIVAROXABAN 20 MG PO TABS: 20.0000 mg | ORAL_TABLET | Freq: Every day | ORAL | 3 refills | Status: DC

## 2016-03-24 NOTE — Progress Notes (Signed)
Jeffery Davis  MRN: [id number] DOB: 15-May-1945  Subjective:  HPI  Patient is here to follow up after shoulder surgery and developing DVT. He was at the hospital for the left shoulder surgery 12/19-12/20/17. On 03/10/16 he had venous duplex test done due to developing a pain in left calf and it showed DVT. He was started on Xarelto 15 mg. He is tolerating it well as far as he can tell. Left calf pain resolved mainly, at times will get a twitch sensation but no swelling or heat to the area present. He is doing well post shoulder replacement, will get some discomfort/pain with certain movements.   He does states he developed a pain in the right glutea, like a sciatica pain it seems and wanted to have this looked at. Patient Active Problem List   Diagnosis Date Noted  . Primary localized osteoarthrosis of left shoulder 03/01/2016  . S/P shoulder replacement 03/01/2016  . Medicare annual wellness visit, subsequent 09/29/2015  . Shortness of breath 09/29/2015  . Chest tightness 09/29/2015  . Fall with injury 09/29/2015  . Actinic keratoses 08/19/2014  . Allergic rhinitis 08/19/2014  . Benign fibroma of prostate 08/19/2014  . Acid indigestion 08/19/2014  . Failure of erection 08/19/2014  . Acid reflux 08/19/2014  . Hypercholesteremia 08/19/2014  . Arthritis, degenerative 08/19/2014    Past Medical History:  Diagnosis Date  . Actinic keratitis   . Allergy   . Arthritis    degenerative  . Benign fibroma of prostate   . Erectile dysfunction   . GERD (gastroesophageal reflux disease)   . Hyperlipidemia   . Primary localized osteoarthrosis of left shoulder 03/01/2016    Social History   Social History  . Marital status: Married    Spouse name: Coralyn Mark  . Number of children: 2  . Years of education: bachelors   Occupational History  . Retired      worked at [employer] 12 years.   .  [facility] Topics  . Smoking status: Never  Smoker  . Smokeless tobacco: Never Used  . Alcohol use 0.6 oz/week    1 Standard drinks or equivalent per week     Comment: 1-2 glasses wine 2-3 times a week  . Drug use: No  . Sexual activity: Not on file   Other Topics Concern  . Not on file   Social History Narrative  . No narrative on file    Outpatient Encounter Prescriptions as of 03/24/2016  Medication Sig Note  . Ascorbic Acid (VITAMIN C) 1000 MG tablet Take 1,000 mg by mouth daily.   Marland Kitchen aspirin 81 MG tablet Take 81 mg by mouth daily.  08/19/2014: Received from: [place]:   . Aspirin-Acetaminophen-Caffeine (EXCEDRIN PO) Take 2 tablets by mouth daily as needed (HEADACHES).   Marland Kitchen atorvastatin (LIPITOR) 10 MG tablet Take 1 tablet (10 mg total) by mouth daily. (Patient taking differently: Take 10 mg by mouth daily at 6 PM. )   . baclofen (LIORESAL) 10 MG tablet Take 1 tablet (10 mg total) by mouth 3 (three) times daily. As needed for muscle spasm   . Capsaicin 0.1 % CREA Apply 1 application topically daily as needed (shoulder pain).   . Cholecalciferol (VITAMIN D3) 5000 units TABS Take 5,000 Units by mouth daily.   . Coenzyme Q10 (CO Q 10) 10 MG CAPS Take 1 capsule by mouth 2 (two) times daily.    Marland Kitchen glucosamine-chondroitin  500-400 MG tablet Take 1 tablet by mouth 2 (two) times daily.    . Omeprazole 20 MG TBEC Take 1 tablet (20 mg total) by mouth daily.   . sildenafil (REVATIO) 20 MG tablet Take 20 mg by mouth daily as needed (ED).    . ondansetron (ZOFRAN) 4 MG tablet Take 1 tablet (4 mg total) by mouth every 8 (eight) hours as needed for nausea or vomiting. (Patient not taking: Reported on 03/24/2016)   . oxyCODONE (ROXICODONE) 5 MG immediate release tablet Take 1-2 tablets (5-10 mg total) by mouth every 4 (four) hours as needed for severe pain. (Patient not taking: Reported on 03/24/2016)   . sennosides-docusate sodium (SENOKOT-S) 8.6-50 MG tablet Take 2 tablets by mouth daily. (Patient not taking:  Reported on 03/24/2016)   . XARELTO 15 MG TABS tablet Take 1 tablet by mouth daily. 03/24/2016: Received from: External Pharmacy   No facility-administered encounter medications on file as of 03/24/2016.     No Known Allergies  Review of Systems  Constitutional: Negative.   Respiratory: Negative.   Cardiovascular: Negative.   Gastrointestinal: Negative.   Musculoskeletal: Positive for joint pain and myalgias.       Limitation in range of motion in left shoulder due to healing post surgery.  Neurological: Negative.     Objective:  BP (!) 148/82   Pulse 60   Temp 97.8 F (36.6 C)   Resp 14   Wt 175 lb (79.4 kg)   BMI 26.61 kg/m   Physical Exam  Constitutional: He is oriented to person, place, and time and well-developed, well-nourished, and in no distress.  HENT:  Head: Normocephalic and atraumatic.  Eyes: Conjunctivae are normal. Pupils are equal, round, and reactive to light.  Neck: Normal range of motion. Neck supple.  Cardiovascular: Normal rate, regular rhythm, normal heart sounds and intact distal pulses.   Pulmonary/Chest: Effort normal and breath sounds normal.  Musculoskeletal: He exhibits no edema or tenderness.  Measurements today : Right calf- 15 inches, Left Calf- 15 1/2 inches.  Neurological: He is alert and oriented to person, place, and time.  Psychiatric: Mood, memory, affect and judgment normal.    Assessment and Plan :  1. S/P shoulder replacement, left Recovering well so far. Seen orthopedic for follow up.  2. Acute deep vein thrombosis (DVT) of proximal vein of left lower extremity (HCC) Stable. Discussed this with patient in detail. I think at this time will keep patient on Xarelto for 3 to 4 months. RX for Xarelto 20 mg 1 tablet daily sent in today.This is not typical for patient to develop after a shoulder surgery, usually see this in knee or hip surgery. On the safe side I would like to get patient to hematologist to make sure there is nothing else  that contributed to this issue.  3. Hypercholesteremia 4. ED (erectile dysfunction) of organic origin 5. Sciatica right side Safest option to use Tylenol and heat. Advised patient while on Xarelto no NSAIDs. Patient understood. Will follow as needed for this, may need further work up.  HPI, Exam and A&P transcribed under direction and in the presence of Miguel Aschoff, MD. I have done the exam and reviewed the chart and it is accurate to the best of my knowledge. Development worker, community has been used and  any errors in dictation or transcription are unintentional. Miguel Aschoff M.D. [facility]

## 2016-03-29 ENCOUNTER — Encounter: Payer: Self-pay | Admitting: Family Medicine

## 2016-03-29 ENCOUNTER — Ambulatory Visit (INDEPENDENT_AMBULATORY_CARE_PROVIDER_SITE_OTHER): Payer: PPO | Admitting: Family Medicine

## 2016-03-29 VITALS — BP 162/84 | HR 65 | Temp 98.1°F | Resp 16 | Wt 178.4 lb

## 2016-03-29 DIAGNOSIS — M7021 Olecranon bursitis, right elbow: Secondary | ICD-10-CM

## 2016-03-29 NOTE — Progress Notes (Signed)
Patient: Jeffery Davis Male    DOB: [birth year]   71 y.o.   MRN: [id number] Visit Date: 03/29/2016  Today's Provider: Vernie Murders, PA   Chief Complaint  Patient presents with  . Elbow Pain    hematoma right elbow   Subjective:    HPI Patient presents today with right elbow pain. Pain started this morning. He has a what appears to be a large hematoma that he reports is painful and limiting mobility of right arm. Patient also reports tingling in right fingers. History of  Patient Active Problem List   Diagnosis Date Noted  . Acute deep vein thrombosis (DVT) of proximal vein of left lower extremity ([place]) 03/24/2016  . Primary localized osteoarthrosis of left shoulder 03/01/2016  . S/P shoulder replacement 03/01/2016  . Medicare annual wellness visit, subsequent 09/29/2015  . Shortness of breath 09/29/2015  . Chest tightness 09/29/2015  . Fall with injury 09/29/2015  . Actinic keratoses 08/19/2014  . Allergic rhinitis 08/19/2014  . Benign fibroma of prostate 08/19/2014  . Acid indigestion 08/19/2014  . Failure of erection 08/19/2014  . Acid reflux 08/19/2014  . Hypercholesteremia 08/19/2014  . Arthritis, degenerative 08/19/2014   Past Surgical History:  Procedure Laterality Date  . CARDIAC CATHETERIZATION  2005   [facility]  . CATARACT EXTRACTION  06/2002  . COLONOSCOPY    . ESOPHAGOGASTRODUODENOSCOPY (EGD) WITH PROPOFOL N/A 01/08/2016   Procedure: ESOPHAGOGASTRODUODENOSCOPY (EGD) WITH PROPOFOL;  Surgeon: Manya Silvas, MD;  Location: [facility] ENDOSCOPY;  Service: Endoscopy;  Laterality: N/A;  . KNEE ARTHROSCOPY Right   . SHOULDER SURGERY Left 2010  . TOTAL SHOULDER ARTHROPLASTY Left 03/01/2016   Procedure: REVERSE TOTAL SHOULDER ARTHROPLASTY;  Surgeon: Marchia Bond, MD;  Location: [place];  Service: Orthopedics;  Laterality: Left;   Family History  Problem Relation Age of Onset  . Cancer Mother     breast and pancreatic  . CAD Father   . Hypertension Father   .  Healthy Daughter   . Healthy Son    No Known Allergies   Previous Medications   ASCORBIC ACID (VITAMIN C) 1000 MG TABLET    Take 1,000 mg by mouth daily.   ATORVASTATIN (LIPITOR) 10 MG TABLET    Take 1 tablet (10 mg total) by mouth daily.   CAPSAICIN 0.1 % CREA    Apply 1 application topically daily as needed (shoulder pain).   CHOLECALCIFEROL (VITAMIN D3) 5000 UNITS TABS    Take 5,000 Units by mouth daily.   COENZYME Q10 (CO Q 10) 10 MG CAPS    Take 1 capsule by mouth 2 (two) times daily.    GLUCOSAMINE-CHONDROITIN 500-400 MG TABLET    Take 1 tablet by mouth 2 (two) times daily.    OMEPRAZOLE 20 MG TBEC    Take 1 tablet (20 mg total) by mouth daily.   ONDANSETRON (ZOFRAN) 4 MG TABLET    Take 1 tablet (4 mg total) by mouth every 8 (eight) hours as needed for nausea or vomiting.   OXYCODONE (ROXICODONE) 5 MG IMMEDIATE RELEASE TABLET    Take 1-2 tablets (5-10 mg total) by mouth every 4 (four) hours as needed for severe pain.   RIVAROXABAN (XARELTO) 20 MG TABS TABLET    Take 1 tablet (20 mg total) by mouth daily with supper.   SENNOSIDES-DOCUSATE SODIUM (SENOKOT-S) 8.6-50 MG TABLET    Take 2 tablets by mouth daily.   SILDENAFIL (REVATIO) 20 MG TABLET    Take 20 mg by mouth daily as  needed (ED).     Review of Systems  Constitutional: Negative.   Respiratory: Negative.   Cardiovascular: Negative.   Musculoskeletal: Positive for arthralgias and joint swelling.    Social History  Substance Use Topics  . Smoking status: Never Smoker  . Smokeless tobacco: Never Used  . Alcohol use 0.6 oz/week    1 Standard drinks or equivalent per week     Comment: 1-2 glasses wine 2-3 times a week   Objective:   BP (!) 162/84 (BP Location: Right Arm, Patient Position: Sitting, Cuff Size: Normal)   Pulse 65   Temp 98.1 F (36.7 C) (Oral)   Resp 16   Wt 178 lb 6.4 oz (80.9 kg)   SpO2 98%   BMI 27.13 kg/m   Physical Exam  Constitutional: He is oriented to person, place, and time. He appears  well-developed and well-nourished. No distress.  HENT:  Head: Normocephalic and atraumatic.  Right Ear: Hearing normal.  Left Ear: Hearing normal.  Nose: Nose normal.  Eyes: Conjunctivae and lids are normal. Right eye exhibits no discharge. Left eye exhibits no discharge. No scleral icterus.  Pulmonary/Chest: Effort normal. No respiratory distress.  Musculoskeletal:  Pain and limited motion in left shoulder from joint replacement 03-01-16. No swelling on the left. Swollen and tense right olecranon bursa. No specific injury known. Good distal pulses and sensation.  Neurological: He is alert and oriented to person, place, and time.  Skin: Skin is intact. No lesion and no rash noted.  Psychiatric: He has a normal mood and affect. His speech is normal and behavior is normal. Thought content normal.      Assessment & Plan:     1. Olecranon bursitis of right elbow Swelling and discomfort in the right elbow olecranon bursa this morning. Suspect leaning on the elbow getting in and out of bed or up and down from a chair since having left shoulder surgery. Attempted aspiration and only bloody fluid removed (approximately 10 cc). No fever or pus in the fluid. Applied compression dressing with Coban, Band-Aid and gauze. Recommend he apply ice pack and he will contact orthopedic surgeon regarding stopping the Xarelto for a while. Recheck in 3 days if needed.

## 2016-04-13 DIAGNOSIS — M19012 Primary osteoarthritis, left shoulder: Secondary | ICD-10-CM | POA: Diagnosis not present

## 2016-04-13 DIAGNOSIS — M7021 Olecranon bursitis, right elbow: Secondary | ICD-10-CM | POA: Diagnosis not present

## 2016-04-14 DIAGNOSIS — M6281 Muscle weakness (generalized): Secondary | ICD-10-CM | POA: Diagnosis not present

## 2016-04-14 DIAGNOSIS — M25512 Pain in left shoulder: Secondary | ICD-10-CM | POA: Diagnosis not present

## 2016-04-14 DIAGNOSIS — M25612 Stiffness of left shoulder, not elsewhere classified: Secondary | ICD-10-CM | POA: Diagnosis not present

## 2016-04-19 DIAGNOSIS — M25612 Stiffness of left shoulder, not elsewhere classified: Secondary | ICD-10-CM | POA: Diagnosis not present

## 2016-04-19 DIAGNOSIS — M6281 Muscle weakness (generalized): Secondary | ICD-10-CM | POA: Diagnosis not present

## 2016-04-19 DIAGNOSIS — M25512 Pain in left shoulder: Secondary | ICD-10-CM | POA: Diagnosis not present

## 2016-04-21 DIAGNOSIS — M6281 Muscle weakness (generalized): Secondary | ICD-10-CM | POA: Diagnosis not present

## 2016-04-21 DIAGNOSIS — M25512 Pain in left shoulder: Secondary | ICD-10-CM | POA: Diagnosis not present

## 2016-04-21 DIAGNOSIS — M25612 Stiffness of left shoulder, not elsewhere classified: Secondary | ICD-10-CM | POA: Diagnosis not present

## 2016-04-25 DIAGNOSIS — M25612 Stiffness of left shoulder, not elsewhere classified: Secondary | ICD-10-CM | POA: Diagnosis not present

## 2016-04-25 DIAGNOSIS — M25512 Pain in left shoulder: Secondary | ICD-10-CM | POA: Diagnosis not present

## 2016-04-25 DIAGNOSIS — M6281 Muscle weakness (generalized): Secondary | ICD-10-CM | POA: Diagnosis not present

## 2016-04-27 DIAGNOSIS — M25612 Stiffness of left shoulder, not elsewhere classified: Secondary | ICD-10-CM | POA: Diagnosis not present

## 2016-04-27 DIAGNOSIS — M6281 Muscle weakness (generalized): Secondary | ICD-10-CM | POA: Diagnosis not present

## 2016-04-27 DIAGNOSIS — M25512 Pain in left shoulder: Secondary | ICD-10-CM | POA: Diagnosis not present

## 2016-05-02 DIAGNOSIS — M25512 Pain in left shoulder: Secondary | ICD-10-CM | POA: Diagnosis not present

## 2016-05-02 DIAGNOSIS — M6281 Muscle weakness (generalized): Secondary | ICD-10-CM | POA: Diagnosis not present

## 2016-05-02 DIAGNOSIS — M25612 Stiffness of left shoulder, not elsewhere classified: Secondary | ICD-10-CM | POA: Diagnosis not present

## 2016-05-04 ENCOUNTER — Other Ambulatory Visit: Payer: Self-pay | Admitting: Family Medicine

## 2016-05-04 DIAGNOSIS — K219 Gastro-esophageal reflux disease without esophagitis: Secondary | ICD-10-CM

## 2016-05-04 DIAGNOSIS — M25512 Pain in left shoulder: Secondary | ICD-10-CM | POA: Diagnosis not present

## 2016-05-04 DIAGNOSIS — M25612 Stiffness of left shoulder, not elsewhere classified: Secondary | ICD-10-CM | POA: Diagnosis not present

## 2016-05-04 DIAGNOSIS — M6281 Muscle weakness (generalized): Secondary | ICD-10-CM | POA: Diagnosis not present

## 2016-05-10 DIAGNOSIS — M25612 Stiffness of left shoulder, not elsewhere classified: Secondary | ICD-10-CM | POA: Diagnosis not present

## 2016-05-10 DIAGNOSIS — M6281 Muscle weakness (generalized): Secondary | ICD-10-CM | POA: Diagnosis not present

## 2016-05-10 DIAGNOSIS — M25512 Pain in left shoulder: Secondary | ICD-10-CM | POA: Diagnosis not present

## 2016-05-11 DIAGNOSIS — M25512 Pain in left shoulder: Secondary | ICD-10-CM | POA: Diagnosis not present

## 2016-05-11 DIAGNOSIS — M19012 Primary osteoarthritis, left shoulder: Secondary | ICD-10-CM | POA: Diagnosis not present

## 2016-05-12 DIAGNOSIS — M25612 Stiffness of left shoulder, not elsewhere classified: Secondary | ICD-10-CM | POA: Diagnosis not present

## 2016-05-12 DIAGNOSIS — M6281 Muscle weakness (generalized): Secondary | ICD-10-CM | POA: Diagnosis not present

## 2016-05-12 DIAGNOSIS — M25512 Pain in left shoulder: Secondary | ICD-10-CM | POA: Diagnosis not present

## 2016-05-16 DIAGNOSIS — M6281 Muscle weakness (generalized): Secondary | ICD-10-CM | POA: Diagnosis not present

## 2016-05-16 DIAGNOSIS — M25512 Pain in left shoulder: Secondary | ICD-10-CM | POA: Diagnosis not present

## 2016-05-16 DIAGNOSIS — M25612 Stiffness of left shoulder, not elsewhere classified: Secondary | ICD-10-CM | POA: Diagnosis not present

## 2016-05-18 DIAGNOSIS — M6281 Muscle weakness (generalized): Secondary | ICD-10-CM | POA: Diagnosis not present

## 2016-05-18 DIAGNOSIS — M25612 Stiffness of left shoulder, not elsewhere classified: Secondary | ICD-10-CM | POA: Diagnosis not present

## 2016-05-18 DIAGNOSIS — M25512 Pain in left shoulder: Secondary | ICD-10-CM | POA: Diagnosis not present

## 2016-05-23 DIAGNOSIS — M25512 Pain in left shoulder: Secondary | ICD-10-CM | POA: Diagnosis not present

## 2016-05-23 DIAGNOSIS — M25612 Stiffness of left shoulder, not elsewhere classified: Secondary | ICD-10-CM | POA: Diagnosis not present

## 2016-05-23 DIAGNOSIS — M6281 Muscle weakness (generalized): Secondary | ICD-10-CM | POA: Diagnosis not present

## 2016-05-30 DIAGNOSIS — L570 Actinic keratosis: Secondary | ICD-10-CM | POA: Diagnosis not present

## 2016-05-30 DIAGNOSIS — L820 Inflamed seborrheic keratosis: Secondary | ICD-10-CM | POA: Diagnosis not present

## 2016-05-30 DIAGNOSIS — M25512 Pain in left shoulder: Secondary | ICD-10-CM | POA: Diagnosis not present

## 2016-05-30 DIAGNOSIS — M6281 Muscle weakness (generalized): Secondary | ICD-10-CM | POA: Diagnosis not present

## 2016-05-30 DIAGNOSIS — M25612 Stiffness of left shoulder, not elsewhere classified: Secondary | ICD-10-CM | POA: Diagnosis not present

## 2016-05-30 DIAGNOSIS — L578 Other skin changes due to chronic exposure to nonionizing radiation: Secondary | ICD-10-CM | POA: Diagnosis not present

## 2016-06-01 DIAGNOSIS — M25612 Stiffness of left shoulder, not elsewhere classified: Secondary | ICD-10-CM | POA: Diagnosis not present

## 2016-06-01 DIAGNOSIS — M25512 Pain in left shoulder: Secondary | ICD-10-CM | POA: Diagnosis not present

## 2016-06-01 DIAGNOSIS — M6281 Muscle weakness (generalized): Secondary | ICD-10-CM | POA: Diagnosis not present

## 2016-06-06 DIAGNOSIS — M6281 Muscle weakness (generalized): Secondary | ICD-10-CM | POA: Diagnosis not present

## 2016-06-06 DIAGNOSIS — M25512 Pain in left shoulder: Secondary | ICD-10-CM | POA: Diagnosis not present

## 2016-06-06 DIAGNOSIS — M25612 Stiffness of left shoulder, not elsewhere classified: Secondary | ICD-10-CM | POA: Diagnosis not present

## 2016-06-08 DIAGNOSIS — M25512 Pain in left shoulder: Secondary | ICD-10-CM | POA: Diagnosis not present

## 2016-06-08 DIAGNOSIS — M25612 Stiffness of left shoulder, not elsewhere classified: Secondary | ICD-10-CM | POA: Diagnosis not present

## 2016-06-08 DIAGNOSIS — M6281 Muscle weakness (generalized): Secondary | ICD-10-CM | POA: Diagnosis not present

## 2016-06-13 DIAGNOSIS — M25512 Pain in left shoulder: Secondary | ICD-10-CM | POA: Diagnosis not present

## 2016-06-20 ENCOUNTER — Encounter: Payer: Self-pay | Admitting: Family Medicine

## 2016-06-20 ENCOUNTER — Ambulatory Visit (INDEPENDENT_AMBULATORY_CARE_PROVIDER_SITE_OTHER): Payer: PPO | Admitting: Family Medicine

## 2016-06-20 VITALS — BP 130/82 | HR 56 | Temp 97.7°F | Resp 16 | Wt 177.0 lb

## 2016-06-20 DIAGNOSIS — N529 Male erectile dysfunction, unspecified: Secondary | ICD-10-CM | POA: Diagnosis not present

## 2016-06-20 DIAGNOSIS — I824Y2 Acute embolism and thrombosis of unspecified deep veins of left proximal lower extremity: Secondary | ICD-10-CM | POA: Diagnosis not present

## 2016-06-20 DIAGNOSIS — Z96612 Presence of left artificial shoulder joint: Secondary | ICD-10-CM | POA: Diagnosis not present

## 2016-06-20 MED ORDER — SILDENAFIL CITRATE 20 MG PO TABS: 20.0000 mg | ORAL_TABLET | Freq: Three times a day (TID) | ORAL | 3 refills | Status: DC | PRN

## 2016-06-20 NOTE — Progress Notes (Signed)
Patient: Jeffery Davis Male    DOB: Sep 23, 1945   71 y.o.   MRN: [id number] Visit Date: 06/20/2016  Today's Provider: [name], MD   Chief Complaint  Patient presents with  . DVT   Subjective:    HPI     Follow up for Acute LLE DVT s/p left shoulder replacement  The patient was last seen for this 3 months ago. Changes made at last visit include continuing Xarelto for 3-4 months.  He reports excellent compliance with treatment. He is not having side effects. Pt reports his calf pain has resolved.  ------------------------------------------------------------------------------------    No Known Allergies   Current Outpatient Prescriptions:  .  atorvastatin (LIPITOR) 10 MG tablet, Take 1 tablet (10 mg total) by mouth daily. (Patient taking differently: Take 10 mg by mouth every other day. ), Disp: 90 tablet, Rfl: 3 .  Cholecalciferol (VITAMIN D3) 5000 units TABS, Take 5,000 Units by mouth daily., Disp: , Rfl:  .  Coenzyme Q10 (CO Q 10) 10 MG CAPS, Take 1 capsule by mouth 2 (two) times daily. , Disp: , Rfl:  .  glucosamine-chondroitin 500-400 MG tablet, Take 1 tablet by mouth 2 (two) times daily. , Disp: , Rfl:  .  omeprazole (PRILOSEC) 20 MG capsule, TAKE 1 CAPSULE BY MOUTH EVERY DAY (Patient taking differently: 1 capsule po QOD), Disp: 30 capsule, Rfl: 11 .  rivaroxaban (XARELTO) 20 MG TABS tablet, Take 1 tablet (20 mg total) by mouth daily with supper., Disp: 90 tablet, Rfl: 3 .  sildenafil (REVATIO) 20 MG tablet, Take 20 mg by mouth daily as needed (ED). , Disp: , Rfl:  .  Ascorbic Acid (VITAMIN C) 1000 MG tablet, Take 1,000 mg by mouth daily., Disp: , Rfl:   Review of Systems  Constitutional: Negative for activity change, appetite change, chills, diaphoresis, fatigue, fever and unexpected weight change.  Respiratory: Negative for shortness of breath.   Cardiovascular: Negative for chest pain, palpitations and leg swelling.    Social History  Substance  Use Topics  . Smoking status: Never Smoker  . Smokeless tobacco: Never Used  . Alcohol use 0.6 oz/week    1 Standard drinks or equivalent per week     Comment: 1-2 glasses wine 2-3 times a week   Objective:   BP 130/82 (BP Location: Right Arm, Patient Position: Sitting, Cuff Size: Normal)   Pulse (!) 56   Temp 97.7 F (36.5 C) (Oral)   Resp 16   Wt 177 lb (80.3 kg)   SpO2 99%   BMI 26.91 kg/m  Vitals:   06/20/16 0952  BP: 130/82  Pulse: (!) 56  Resp: 16  Temp: 97.7 F (36.5 C)  TempSrc: Oral  SpO2: 99%  Weight: 177 lb (80.3 kg)     Physical Exam  Constitutional: He appears well-developed and well-nourished.  Cardiovascular: Regular rhythm and normal heart sounds.   Pulmonary/Chest: Effort normal and breath sounds normal. No respiratory distress.  Musculoskeletal: Normal range of motion. He exhibits no edema or tenderness.  Right calf is measured at 15 cm, and left calf is 15.25 cm.  Psychiatric: He has a normal mood and affect. His behavior is normal.        Assessment & Plan:     1. Acute deep vein thrombosis (DVT) of proximal vein of left lower extremity (HCC) Pt has upcoming travels. Is flying to the [place]. Will speak with Dr. Grayland Ormond in regards to D/C Xarelto. Will FU  pending consultation with Dr. Grayland Ormond. Has been on for more than 3 months --dr Maryjane Hurter called and feels he can stop Xarelto and then see Dr Maryjane Hurter for w/u of unprovoked DVT.  2. Status post replacement of left shoulder joint Stable.  3. ED (erectile dysfunction) of organic origin Medications refilled. - sildenafil (REVATIO) 20 MG tablet; Take 1 tablet (20 mg total) by mouth 3 (three) times daily as needed (ED).  Dispense: 90 tablet; Refill: 3     Patient seen and examined by Miguel Aschoff, MD, and note scribed by Renaldo Fiddler, CMA. I have done the exam and reviewed the above chart and it is accurate to the best of my knowledge. Development worker, community has been used in this note in  any air is in the dictation or transcription are unintentional.  [name], MD  [place]

## 2016-06-22 ENCOUNTER — Other Ambulatory Visit: Payer: Self-pay

## 2016-06-22 DIAGNOSIS — I824Y2 Acute embolism and thrombosis of unspecified deep veins of left proximal lower extremity: Secondary | ICD-10-CM

## 2016-07-04 NOTE — Progress Notes (Signed)
[place]  Telephone:([phone]) [phone] Fax:(336) [phone]  ID: Jeffery Davis OB: 02/19/1946  MR#: [id number]  QIO#:[id number]  Patient Care Team: Jerrol Banana., MD as PCP - General (Family Medicine)  CHIEF COMPLAINT: Acute DVT of left lower extremity.  INTERVAL HISTORY: Patient is a 71 year old male who developed a DVT of his left lower extremity several days after her left shoulder replacement. He was placed on anticoagulation and recently completed 3 months of treatment. He is referred to see if there is any underlying reason for his blood clot. He currently feels well and is asymptomatic. He denies any recent fevers or illnesses. He has a good appetite and denies weight loss. He has no neurologic complaints. He denies any chest pain or shortness of breath. He has no nausea, vomiting, constipation, or diarrhea. He has no urinary complaints. Patient feels at his baseline and offers no specific complaints today.  REVIEW OF SYSTEMS:   Review of Systems  Constitutional: Negative.  Negative for fever, malaise/fatigue and weight loss.  Respiratory: Negative.  Negative for cough and shortness of breath.   Cardiovascular: Negative.  Negative for chest pain and leg swelling.  Gastrointestinal: Negative.  Negative for abdominal pain.  Genitourinary: Negative.   Musculoskeletal: Negative.  Negative for joint pain and myalgias.  Skin: Negative.  Negative for rash.  Neurological: Negative.  Negative for sensory change and weakness.  Psychiatric/Behavioral: Negative.  The patient is not nervous/anxious.     As per HPI. Otherwise, a complete review of systems is negative.  PAST MEDICAL HISTORY: Past Medical History:  Diagnosis Date  . Actinic keratitis   . Allergy   . Arthritis    degenerative  . Benign fibroma of prostate   . Erectile dysfunction   . GERD (gastroesophageal reflux disease)   . Hyperlipidemia   . Primary localized osteoarthrosis of left shoulder  03/01/2016    PAST SURGICAL HISTORY: Past Surgical History:  Procedure Laterality Date  . CARDIAC CATHETERIZATION  2005   [facility]  . CATARACT EXTRACTION  06/2002  . COLONOSCOPY    . ESOPHAGOGASTRODUODENOSCOPY (EGD) WITH PROPOFOL N/A 01/08/2016   Procedure: ESOPHAGOGASTRODUODENOSCOPY (EGD) WITH PROPOFOL;  Surgeon: Manya Silvas, MD;  Location: [facility] END[facility]COPY;  Service: Endoscopy;  Laterality: N/A;  . KNEE ARTHROSCOPY Right   . SHOULDER SURGERY Left 2010  . TOTAL SHOULDER ARTHROPLASTY Left 03/01/2016   Procedure: REVERSE TOTAL SHOULDER ARTHROPLASTY;  Surgeon: Marchia Bond, MD;  Location: [place];  Service: Orthopedics;  Laterality: Left;    FAMILY HISTORY: Family History  Problem Relation Age of Onset  . Cancer Mother     breast and pancreatic  . CAD Father   . Hypertension Father   . Healthy Daughter   . Healthy Son     ADVANCED DIRECTIVES (Y/N):  N  HEALTH MAINTENANCE: Social History  Substance Use Topics  . Smoking status: Never Smoker  . Smokeless tobacco: Never Used  . Alcohol use 0.6 oz/week    1 Standard drinks or equivalent per week     Comment: 1-2 glasses wine 2-3 times a week     Colonoscopy:  PAP:  Bone density:  Lipid panel:  No Known Allergies  Current Outpatient Prescriptions  Medication Sig Dispense Refill  . Ascorbic Acid (VITAMIN C) 1000 MG tablet Take 1,000 mg by mouth daily.    Marland Kitchen atorvastatin (LIPITOR) 10 MG tablet Take 1 tablet (10 mg total) by mouth daily. (Patient taking differently: Take 10 mg by mouth every other day. )  90 tablet 3  . Cholecalciferol (VITAMIN D3) 5000 units TABS Take 5,000 Units by mouth daily.    . Coenzyme Q10 (CO Q 10) 10 MG CAPS Take 1 capsule by mouth 2 (two) times daily.     Marland Kitchen glucosamine-chondroitin 500-400 MG tablet Take 1 tablet by mouth 2 (two) times daily.     Marland Kitchen omeprazole (PRILOSEC) 20 MG capsule TAKE 1 CAPSULE BY MOUTH EVERY DAY (Patient taking differently: 1 capsule po QOD) 30 capsule 11  .  sildenafil (REVATIO) 20 MG tablet Take 1 tablet (20 mg total) by mouth 3 (three) times daily as needed (ED). 90 tablet 3   No current facility-administered medications for this visit.     OBJECTIVE: Vitals:   07/05/16 1217  BP: (!) 178/95  Pulse: (!) 58     Body mass index is 27.06 kg/m.    ECOG FS:0 - Asymptomatic  General: Well-developed, well-nourished, no acute distress. Eyes: Pink conjunctiva, anicteric sclera. HEENT: Normocephalic, moist mucous membranes, clear oropharnyx. Lungs: Clear to auscultation bilaterally. Heart: Regular rate and rhythm. No rubs, murmurs, or gallops. Abdomen: Soft, nontender, nondistended. No organomegaly noted, normoactive bowel sounds. Musculoskeletal: No edema, cyanosis, or clubbing. Neuro: Alert, answering all questions appropriately. Cranial nerves grossly intact. Skin: No rashes or petechiae noted. Psych: Normal affect. Lymphatics: No cervical, calvicular, axillary or inguinal LAD.   LAB RESULTS:  Lab Results  Component Value Date   NA 133 (L) 03/02/2016   K 3.7 03/02/2016   CL 102 03/02/2016   CO2 25 03/02/2016   GLUCOSE 122 (H) 03/02/2016   BUN 13 03/02/2016   CREATININE 0.82 03/02/2016   CALCIUM 8.4 (L) 03/02/2016   PROT 6.6 09/29/2015   ALBUMIN 4.3 09/29/2015   AST 45 (H) 09/29/2015   ALT 43 09/29/2015   ALKPHOS 81 09/29/2015   BILITOT 0.7 09/29/2015   GFRNONAA >60 03/02/2016   GFRAA >60 03/02/2016    Lab Results  Component Value Date   WBC 8.6 03/02/2016   NEUTROABS 4.2 11/17/2015   HGB 11.4 (L) 03/02/2016   HCT 34.4 (L) 03/02/2016   MCV 91.7 03/02/2016   PLT 223 03/02/2016     STUDIES: No results found.  ASSESSMENT: Acute DVT of left lower extremity  PLAN:    1. Acute DVT of left lower extremity: Despite having an orthopedic procedure, patient's DVT is essentially unprovoked since he had shoulder surgery and developed a left lower extremity DVT. He completed 3 months of anticoagulation. Full hypercoagulable  workup is negative except for Factor V Leiden and the prothrombin gene mutation which are pending at time of dictation. If everything returns negative, no further follow-up is needed and no further anticoagulation is needed. If positive, patient will return to clinic to discuss the risks and benefits of reinitiating anticoagulation.  Approximately 40 minutes was spent in discussion of which greater than 50% was consultation.  Patient expressed understanding and was in agreement with this plan. He also understands that He can call clinic at any time with any questions, concerns, or complaints.    Lloyd Huger, MD   07/08/2016 3:13 PM

## 2016-07-05 ENCOUNTER — Encounter: Payer: Self-pay | Admitting: Oncology

## 2016-07-05 ENCOUNTER — Inpatient Hospital Stay: Payer: PPO

## 2016-07-05 ENCOUNTER — Inpatient Hospital Stay: Payer: PPO | Attending: Oncology | Admitting: Oncology

## 2016-07-05 VITALS — BP 178/95 | HR 58 | Wt 178.0 lb

## 2016-07-05 DIAGNOSIS — I824Y2 Acute embolism and thrombosis of unspecified deep veins of left proximal lower extremity: Secondary | ICD-10-CM

## 2016-07-05 DIAGNOSIS — Z96612 Presence of left artificial shoulder joint: Secondary | ICD-10-CM | POA: Diagnosis not present

## 2016-07-05 DIAGNOSIS — E785 Hyperlipidemia, unspecified: Secondary | ICD-10-CM | POA: Diagnosis not present

## 2016-07-05 DIAGNOSIS — Z86718 Personal history of other venous thrombosis and embolism: Secondary | ICD-10-CM | POA: Insufficient documentation

## 2016-07-05 DIAGNOSIS — N529 Male erectile dysfunction, unspecified: Secondary | ICD-10-CM | POA: Diagnosis not present

## 2016-07-05 DIAGNOSIS — Z79899 Other long term (current) drug therapy: Secondary | ICD-10-CM | POA: Insufficient documentation

## 2016-07-05 DIAGNOSIS — K219 Gastro-esophageal reflux disease without esophagitis: Secondary | ICD-10-CM | POA: Insufficient documentation

## 2016-07-05 LAB — ANTITHROMBIN III: AntiThromb III Func: 116 % (ref 75–120)

## 2016-07-06 LAB — CARDIOLIPIN ANTIBODIES, IGG, IGM, IGA
Anticardiolipin IgA: <9 APL U/mL (ref 0–11)
Anticardiolipin IgG: <9 GPL U/mL (ref 0–14)
Anticardiolipin IgM: <9 MPL U/mL (ref 0–12)

## 2016-07-06 LAB — LUPUS ANTICOAGULANT PANEL
DRVVT: 36 s (ref 0.0–47.0)
PTT Lupus Anticoagulant: 30.7 s (ref 0.0–51.9)

## 2016-07-06 LAB — PROTEIN S, TOTAL: Protein S Ag, Total: 89 % (ref 60–150)

## 2016-07-06 LAB — PROTEIN C ACTIVITY: Protein C Activity: 134 % (ref 73–180)

## 2016-07-06 LAB — HOMOCYSTEINE: Homocysteine: 12.3 umol/L (ref 0.0–15.0)

## 2016-07-06 LAB — PROTEIN S ACTIVITY: Protein S Activity: 146 % — ABNORMAL HIGH (ref 63–140)

## 2016-07-07 LAB — BETA-2-GLYCOPROTEIN I ABS, IGG/M/A
Beta-2 Glyco I IgG: <9 GPI IgG units (ref 0–20)
Beta-2-Glycoprotein I IgA: <9 GPI IgA units (ref 0–25)
Beta-2-Glycoprotein I IgM: <9 GPI IgM units (ref 0–32)

## 2016-07-08 LAB — PROTEIN C, TOTAL: Protein C, Total: 117 % (ref 60–150)

## 2016-07-11 LAB — PROTHROMBIN GENE MUTATION

## 2016-07-11 LAB — FACTOR 5 LEIDEN

## 2016-07-13 ENCOUNTER — Telehealth: Payer: Self-pay | Admitting: Family Medicine

## 2016-07-13 NOTE — Telephone Encounter (Signed)
Called Pt to schedule AWV with [facility] - knb °

## 2016-07-15 ENCOUNTER — Other Ambulatory Visit: Payer: Self-pay | Admitting: Emergency Medicine

## 2016-07-15 DIAGNOSIS — M5430 Sciatica, unspecified side: Secondary | ICD-10-CM

## 2016-07-15 MED ORDER — CYCLOBENZAPRINE HCL 10 MG PO TABS: 10.0000 mg | ORAL_TABLET | Freq: Every day | ORAL | 0 refills | Status: DC

## 2016-07-15 MED ORDER — PREDNISONE 10 MG (21) PO TBPK: ORAL_TABLET | ORAL | 0 refills | Status: DC

## 2016-07-15 NOTE — Progress Notes (Signed)
Per Dr. Rosanna Randy verbal orders, med sent to pharmacy.

## 2016-07-21 ENCOUNTER — Ambulatory Visit
Admission: RE | Admit: 2016-07-21 | Discharge: 2016-07-21 | Disposition: A | Discharge status: Home / Self Care | Payer: PPO | Source: Ambulatory Visit | Attending: Family Medicine | Admitting: Family Medicine

## 2016-07-21 ENCOUNTER — Telehealth: Payer: Self-pay | Admitting: Family Medicine

## 2016-07-21 ENCOUNTER — Ambulatory Visit (INDEPENDENT_AMBULATORY_CARE_PROVIDER_SITE_OTHER): Payer: PPO | Admitting: Family Medicine

## 2016-07-21 VITALS — BP 124/62 | HR 60 | Temp 98.3°F | Resp 14 | Wt 180.0 lb

## 2016-07-21 DIAGNOSIS — I824Y2 Acute embolism and thrombosis of unspecified deep veins of left proximal lower extremity: Secondary | ICD-10-CM

## 2016-07-21 DIAGNOSIS — M6530 Trigger finger, unspecified finger: Secondary | ICD-10-CM | POA: Insufficient documentation

## 2016-07-21 DIAGNOSIS — N529 Male erectile dysfunction, unspecified: Secondary | ICD-10-CM

## 2016-07-21 DIAGNOSIS — M48061 Spinal stenosis, lumbar region without neurogenic claudication: Secondary | ICD-10-CM | POA: Insufficient documentation

## 2016-07-21 DIAGNOSIS — M545 Low back pain: Secondary | ICD-10-CM | POA: Diagnosis not present

## 2016-07-21 DIAGNOSIS — M5441 Lumbago with sciatica, right side: Secondary | ICD-10-CM | POA: Insufficient documentation

## 2016-07-21 DIAGNOSIS — M47816 Spondylosis without myelopathy or radiculopathy, lumbar region: Secondary | ICD-10-CM | POA: Insufficient documentation

## 2016-07-21 DIAGNOSIS — E7800 Pure hypercholesterolemia, unspecified: Secondary | ICD-10-CM | POA: Diagnosis not present

## 2016-07-21 DIAGNOSIS — M222X9 Patellofemoral disorders, unspecified knee: Secondary | ICD-10-CM | POA: Insufficient documentation

## 2016-07-21 MED ORDER — CELECOXIB 200 MG PO CAPS: 200.0000 mg | ORAL_CAPSULE | Freq: Every day | ORAL | 1 refills | Status: DC

## 2016-07-21 NOTE — Telephone Encounter (Signed)
Pt states the Rx for celecoxib (CELEBREX) 200 MG capsule is $45.00.  Pt is asking if he can get something different that is less expensive.  [facility]  CB#[phone]/MW

## 2016-07-21 NOTE — Telephone Encounter (Signed)
Please review-aa 

## 2016-07-21 NOTE — Progress Notes (Signed)
Jeffery Davis  MRN: [id number] DOB: 1945/07/27  Subjective:  HPI  Developed a back pain on the right side that started about 1 week and 2 days, pain radiates down the right leg and is having some weakness. Pain is sharp at times. Pain is present most of the time but varies on the intensity. Patient was given Flexeril and Prednisone on 07/15/16 and he has also been taking Tramadol as needed. Pain has improved but is still there and "pretty sharp." Sometimes gets sensation like its catching in the lower back. On the scale of 1 to 10 the pain at first was about 9 or 10 before treatment on 07/15/16 and now about a 6 or an 8. Patient Active Problem List   Diagnosis Date Noted  . Acute deep vein thrombosis (DVT) of proximal vein of left lower extremity ([place]) 03/24/2016  . Primary localized osteoarthrosis of left shoulder 03/01/2016  . S/P shoulder replacement 03/01/2016  . Medicare annual wellness visit, subsequent 09/29/2015  . Shortness of breath 09/29/2015  . Chest tightness 09/29/2015  . Fall with injury 09/29/2015  . Actinic keratoses 08/19/2014  . Allergic rhinitis 08/19/2014  . Benign fibroma of prostate 08/19/2014  . Acid indigestion 08/19/2014  . Failure of erection 08/19/2014  . Acid reflux 08/19/2014  . Hypercholesteremia 08/19/2014  . Arthritis, degenerative 08/19/2014    Past Medical History:  Diagnosis Date  . Actinic keratitis   . Allergy   . Arthritis    degenerative  . Benign fibroma of prostate   . Erectile dysfunction   . GERD (gastroesophageal reflux disease)   . Hyperlipidemia   . Primary localized osteoarthrosis of left shoulder 03/01/2016    Social History   Social History  . Marital status: Married    Spouse name: Coralyn Mark  . Number of children: 2  . Years of education: bachelors   Occupational History  . Retired      worked at [employer] 12 years.   .  [place] Topics  . Smoking status: Never Smoker   . Smokeless tobacco: Never Used  . Alcohol use 0.6 oz/week    1 Standard drinks or equivalent per week     Comment: 1-2 glasses wine 2-3 times a week  . Drug use: No  . Sexual activity: Not on file   Other Topics Concern  . Not on file   Social History Narrative  . No narrative on file    Outpatient Encounter Prescriptions as of 07/21/2016  Medication Sig  . Ascorbic Acid (VITAMIN C) 1000 MG tablet Take 1,000 mg by mouth daily.  Marland Kitchen atorvastatin (LIPITOR) 10 MG tablet Take 1 tablet (10 mg total) by mouth daily. (Patient taking differently: Take 10 mg by mouth every other day. )  . Cholecalciferol (VITAMIN D3) 5000 units TABS Take 5,000 Units by mouth daily.  . Coenzyme Q10 (CO Q 10) 10 MG CAPS Take 1 capsule by mouth 2 (two) times daily.   . cyclobenzaprine (FLEXERIL) 10 MG tablet Take 1 tablet (10 mg total) by mouth at bedtime.  Marland Kitchen glucosamine-chondroitin 500-400 MG tablet Take 1 tablet by mouth 2 (two) times daily.   Marland Kitchen omeprazole (PRILOSEC) 20 MG capsule TAKE 1 CAPSULE BY MOUTH EVERY DAY (Patient taking differently: 1 capsule po QOD)  . sildenafil (REVATIO) 20 MG tablet Take 1 tablet (20 mg total) by mouth 3 (three) times daily as needed (ED).  . [DISCONTINUED] predniSONE (STERAPRED UNI-PAK 21 TAB)  10 MG (21) TBPK tablet Take 6 tablets on the first day, then decrease by one until finished.   No facility-administered encounter medications on file as of 07/21/2016.     No Known Allergies  Review of Systems  Eyes: Negative.   Respiratory: Negative.   Cardiovascular: Negative.   Gastrointestinal: Negative.   Musculoskeletal: Positive for back pain and joint pain.       Sciatica pain. Catching sensation.  Neurological: Positive for weakness (in the leg).  Endo/Heme/Allergies: Negative.   Psychiatric/Behavioral: Negative.     Objective:  BP 124/62   Pulse 60   Temp 98.3 F (36.8 C)   Resp 14   Wt 180 lb (81.6 kg)   BMI 27.37 kg/m   Physical Exam  Constitutional: He is  oriented to person, place, and time and well-developed, well-nourished, and in no distress.  HENT:  Head: Normocephalic and atraumatic.  Right Ear: External ear normal.  Left Ear: External ear normal.  Nose: Nose normal.  Eyes: Conjunctivae are normal. Pupils are equal, round, and reactive to light.  Neck: Normal range of motion. Neck supple.  Cardiovascular: Normal rate, regular rhythm, normal heart sounds and intact distal pulses.  Exam reveals no gallop.   No murmur heard. Pulmonary/Chest: Effort normal and breath sounds normal. No respiratory distress. He has no wheezes.  Abdominal: Soft.  Neurological: He is alert and oriented to person, place, and time.  Skin: Skin is warm and dry.  Psychiatric: Mood, memory, affect and judgment normal.   Assessment and Plan :  1. Acute right-sided back pain with sciatica Better but not resolved .Discussed options with patient. Advised patient may need MRI done but need to try and treat for about 1 month so we can document this. Get LS Spine xray, continue Flexeril at bedtime, start Celebrex. Refer for physical therapy. Follow. May need further work up.  2. Acute deep vein thrombosis (DVT) of proximal vein of left lower extremity ([place]) 3. Hypercholesteremia  4. ED (erectile dysfunction) of organic origin  HPI, Exam and A&P transcribed by Theressa Millard, RMA under direction and in the presence of Miguel Aschoff, MD. I have done the exam and reviewed the chart and it is accurate to the best of my knowledge. Development worker, community has been used and  any errors in dictation or transcription are unintentional. Miguel Aschoff M.D. [facility]

## 2016-07-22 NOTE — Telephone Encounter (Signed)
I spoke to pt--taken care of .

## 2016-07-29 ENCOUNTER — Telehealth: Payer: Self-pay | Admitting: *Deleted

## 2016-07-29 NOTE — Telephone Encounter (Signed)
Returned call to patient and left message that all labs were wnl per Dr. Grayland Ormond and no follow up was required, to call clinic with questions.

## 2016-08-01 DIAGNOSIS — L570 Actinic keratosis: Secondary | ICD-10-CM | POA: Diagnosis not present

## 2016-08-01 DIAGNOSIS — L820 Inflamed seborrheic keratosis: Secondary | ICD-10-CM | POA: Diagnosis not present

## 2016-08-01 DIAGNOSIS — L821 Other seborrheic keratosis: Secondary | ICD-10-CM | POA: Diagnosis not present

## 2016-08-01 DIAGNOSIS — L578 Other skin changes due to chronic exposure to nonionizing radiation: Secondary | ICD-10-CM | POA: Diagnosis not present

## 2016-08-15 ENCOUNTER — Ambulatory Visit: Payer: PPO | Admitting: Oncology

## 2016-08-22 ENCOUNTER — Encounter: Payer: Self-pay | Admitting: Family Medicine

## 2016-08-22 ENCOUNTER — Ambulatory Visit (INDEPENDENT_AMBULATORY_CARE_PROVIDER_SITE_OTHER): Payer: PPO | Admitting: Family Medicine

## 2016-08-22 VITALS — BP 112/62 | HR 62 | Resp 16 | Wt 177.0 lb

## 2016-08-22 DIAGNOSIS — I824Y2 Acute embolism and thrombosis of unspecified deep veins of left proximal lower extremity: Secondary | ICD-10-CM

## 2016-08-22 DIAGNOSIS — E7800 Pure hypercholesterolemia, unspecified: Secondary | ICD-10-CM | POA: Diagnosis not present

## 2016-08-22 DIAGNOSIS — D649 Anemia, unspecified: Secondary | ICD-10-CM

## 2016-08-22 DIAGNOSIS — K219 Gastro-esophageal reflux disease without esophagitis: Secondary | ICD-10-CM

## 2016-08-22 NOTE — Progress Notes (Signed)
Patient: Jeffery Davis Male    DOB: November 06, 1945   71 y.o.   MRN: [id number] Visit Date: 08/22/2016  Today's Provider: [name], MD   Chief Complaint  Patient presents with  . Follow-up  . Back Pain   Subjective:    HPI Patient comes in today for a follow up on sciatica and low back pain. He was last seen in the office 1 month ago, and was treated with Flexeril and Celebrex. Patient reports that he no longer takes the Flexeril, and he never started the Celebrex. He reports that his sciatica is significantly better. He does have the low back pain, but it is not as bad. He also declined the physical therapy referral due to feeling much better.     No Known Allergies   Current Outpatient Prescriptions:  .  Ascorbic Acid (VITAMIN C) 1000 MG tablet, Take 1,000 mg by mouth daily., Disp: , Rfl:  .  atorvastatin (LIPITOR) 10 MG tablet, Take 1 tablet (10 mg total) by mouth daily. (Patient taking differently: Take 10 mg by mouth every other day. ), Disp: 90 tablet, Rfl: 3 .  Cholecalciferol (VITAMIN D3) 5000 units TABS, Take 5,000 Units by mouth daily., Disp: , Rfl:  .  Coenzyme Q10 (CO Q 10) 10 MG CAPS, Take 1 capsule by mouth 2 (two) times daily. , Disp: , Rfl:  .  glucosamine-chondroitin 500-400 MG tablet, Take 1 tablet by mouth 2 (two) times daily. , Disp: , Rfl:  .  omeprazole (PRILOSEC) 20 MG capsule, TAKE 1 CAPSULE BY MOUTH EVERY DAY (Patient taking differently: 1 capsule po QOD), Disp: 30 capsule, Rfl: 11 .  sildenafil (REVATIO) 20 MG tablet, Take 1 tablet (20 mg total) by mouth 3 (three) times daily as needed (ED)., Disp: 90 tablet, Rfl: 3 .  celecoxib (CELEBREX) 200 MG capsule, Take 1 capsule (200 mg total) by mouth daily. (Patient not taking: Reported on 08/22/2016), Disp: 30 capsule, Rfl: 1 .  cyclobenzaprine (FLEXERIL) 10 MG tablet, Take 1 tablet (10 mg total) by mouth at bedtime. (Patient not taking: Reported on 08/22/2016), Disp: 30 tablet, Rfl: 0  Review of Systems    Constitutional: Negative.   HENT: Negative.   Eyes: Negative.   Respiratory: Negative.   Cardiovascular: Negative.   Gastrointestinal: Negative.   Musculoskeletal: Positive for back pain and myalgias.  Allergic/Immunologic: Negative.   Neurological: Negative.   Psychiatric/Behavioral: Negative.     Social History  Substance Use Topics  . Smoking status: Never Smoker  . Smokeless tobacco: Never Used  . Alcohol use 0.6 oz/week    1 Standard drinks or equivalent per week     Comment: 1-2 glasses wine 2-3 times a week   Objective:   BP 112/62 (BP Location: Right Arm, Patient Position: Sitting, Cuff Size: Normal)   Pulse 62   Resp 16   Wt 177 lb (80.3 kg)   SpO2 98%   BMI 26.91 kg/m  Vitals:   08/22/16 1551  BP: 112/62  Pulse: 62  Resp: 16  SpO2: 98%  Weight: 177 lb (80.3 kg)     Physical Exam  Constitutional: He is oriented to person, place, and time. He appears well-developed and well-nourished.  HENT:  Head: Normocephalic and atraumatic.  Right Ear: External ear normal.  Left Ear: External ear normal.  Nose: Nose normal.  Eyes: Conjunctivae are normal. No scleral icterus.  Neck: No thyromegaly present.  Cardiovascular: Normal rate, regular rhythm and normal heart sounds.  Pulmonary/Chest: Effort normal and breath sounds normal.  Abdominal: Soft.  Neurological: He is alert and oriented to person, place, and time.  Skin: Skin is warm and dry.  Psychiatric: He has a normal mood and affect. His behavior is normal. Judgment and thought content normal.        Assessment & Plan:     1. Hypercholesteremia  - Lipid Panel With LDL/HDL Ratio  2. Gastroesophageal reflux disease, esophagitis presence not specified  - TSH - Comprehensive metabolic panel  3. Borderline anemia  - CBC with Differential/Platelet 4.DVT Saw hematology for coagulopathy work up.     I have done the exam and reviewed the above chart and it is accurate to the best of my knowledge.  Development worker, community has been used in this note in any air is in the dictation or transcription are unintentional.  [name], MD  [place]

## 2016-08-24 DIAGNOSIS — M19012 Primary osteoarthritis, left shoulder: Secondary | ICD-10-CM | POA: Diagnosis not present

## 2016-08-25 DIAGNOSIS — E7800 Pure hypercholesterolemia, unspecified: Secondary | ICD-10-CM | POA: Diagnosis not present

## 2016-08-25 DIAGNOSIS — K219 Gastro-esophageal reflux disease without esophagitis: Secondary | ICD-10-CM | POA: Diagnosis not present

## 2016-08-25 DIAGNOSIS — D649 Anemia, unspecified: Secondary | ICD-10-CM | POA: Diagnosis not present

## 2016-08-26 LAB — CBC WITH DIFFERENTIAL/PLATELET
Basophils Absolute: 0 10*3/uL (ref 0.0–0.2)
Basos: 0 %
EOS (ABSOLUTE): 0.1 10*3/uL (ref 0.0–0.4)
Eos: 2 %
Hematocrit: 42.3 % (ref 37.5–51.0)
Hemoglobin: 14.4 g/dL (ref 13.0–17.7)
Immature Grans (Abs): 0 10*3/uL (ref 0.0–0.1)
Immature Granulocytes: 0 %
Lymphocytes Absolute: 2.1 10*3/uL (ref 0.7–3.1)
Lymphs: 39 %
MCH: 31.9 pg (ref 26.6–33.0)
MCHC: 34 g/dL (ref 31.5–35.7)
MCV: 94 fL (ref 79–97)
Monocytes Absolute: 0.6 10*3/uL (ref 0.1–0.9)
Monocytes: 12 %
Neutrophils Absolute: 2.5 10*3/uL (ref 1.4–7.0)
Neutrophils: 47 %
Platelets: 221 10*3/uL (ref 150–379)
RBC: 4.52 x10E6/uL (ref 4.14–5.80)
RDW: 16 % — ABNORMAL HIGH (ref 12.3–15.4)
WBC: 5.4 10*3/uL (ref 3.4–10.8)

## 2016-08-26 LAB — COMPREHENSIVE METABOLIC PANEL
ALT: 20 IU/L (ref 0–44)
AST: 28 IU/L (ref 0–40)
Albumin/Globulin Ratio: 2.1 (ref 1.2–2.2)
Albumin: 4.6 g/dL (ref 3.5–4.8)
Alkaline Phosphatase: 80 IU/L (ref 39–117)
BUN/Creatinine Ratio: 24 (ref 10–24)
BUN: 26 mg/dL (ref 8–27)
Bilirubin Total: 0.6 mg/dL (ref 0.0–1.2)
CO2: 25 mmol/L (ref 20–29)
Calcium: 9.6 mg/dL (ref 8.6–10.2)
Chloride: 104 mmol/L (ref 96–106)
Creatinine, Ser: 1.1 mg/dL (ref 0.76–1.27)
GFR calc Af Amer: 78 mL/min/{1.73_m2} (ref >59)
GFR calc non Af Amer: 68 mL/min/{1.73_m2} (ref >59)
Globulin, Total: 2.2 g/dL (ref 1.5–4.5)
Glucose: 102 mg/dL — ABNORMAL HIGH (ref 65–99)
Potassium: 4.4 mmol/L (ref 3.5–5.2)
Sodium: 142 mmol/L (ref 134–144)
Total Protein: 6.8 g/dL (ref 6.0–8.5)

## 2016-08-26 LAB — LIPID PANEL WITH LDL/HDL RATIO
Cholesterol, Total: 166 mg/dL (ref 100–199)
HDL: 50 mg/dL (ref >39)
LDL Calculated: 96 mg/dL (ref 0–99)
LDl/HDL Ratio: 1.9 ratio (ref 0.0–3.6)
Triglycerides: 98 mg/dL (ref 0–149)
VLDL Cholesterol Cal: 20 mg/dL (ref 5–40)

## 2016-08-26 LAB — TSH: TSH: 2.74 u[IU]/mL (ref 0.450–4.500)

## 2016-08-29 NOTE — Progress Notes (Signed)
Advised  ED 

## 2016-09-05 ENCOUNTER — Other Ambulatory Visit: Payer: Self-pay | Admitting: Emergency Medicine

## 2016-09-05 ENCOUNTER — Other Ambulatory Visit: Payer: Self-pay | Admitting: *Deleted

## 2016-09-05 DIAGNOSIS — M5441 Lumbago with sciatica, right side: Secondary | ICD-10-CM

## 2016-09-05 MED ORDER — TRAMADOL HCL 50 MG PO TABS: 50.0000 mg | ORAL_TABLET | ORAL | 1 refills | Status: DC | PRN

## 2016-09-05 MED ORDER — PREDNISONE 10 MG PO TABS: ORAL_TABLET | ORAL | 1 refills | Status: DC

## 2016-09-12 ENCOUNTER — Other Ambulatory Visit: Payer: Self-pay

## 2016-09-12 DIAGNOSIS — M5416 Radiculopathy, lumbar region: Secondary | ICD-10-CM

## 2016-09-13 ENCOUNTER — Ambulatory Visit: Payer: PPO

## 2016-09-14 ENCOUNTER — Ambulatory Visit
Admission: RE | Admit: 2016-09-14 | Discharge: 2016-09-14 | Disposition: A | Discharge status: Home / Self Care | Payer: PPO | Source: Ambulatory Visit | Attending: Family Medicine | Admitting: Family Medicine

## 2016-09-14 ENCOUNTER — Ambulatory Visit
Admission: EM | Admit: 2016-09-14 | Discharge: 2016-09-14 | Disposition: A | Discharge status: Home / Self Care | Payer: PPO | Attending: Family Medicine | Admitting: Family Medicine

## 2016-09-14 DIAGNOSIS — M5126 Other intervertebral disc displacement, lumbar region: Secondary | ICD-10-CM | POA: Diagnosis not present

## 2016-09-14 DIAGNOSIS — M5416 Radiculopathy, lumbar region: Secondary | ICD-10-CM

## 2016-09-14 DIAGNOSIS — M1288 Other specific arthropathies, not elsewhere classified, other specified site: Secondary | ICD-10-CM | POA: Insufficient documentation

## 2016-09-14 DIAGNOSIS — M5127 Other intervertebral disc displacement, lumbosacral region: Secondary | ICD-10-CM | POA: Insufficient documentation

## 2016-09-15 ENCOUNTER — Telehealth: Payer: Self-pay | Admitting: Emergency Medicine

## 2016-09-15 ENCOUNTER — Ambulatory Visit: Payer: PPO

## 2016-09-15 ENCOUNTER — Other Ambulatory Visit: Payer: Self-pay

## 2016-09-15 DIAGNOSIS — M5416 Radiculopathy, lumbar region: Secondary | ICD-10-CM | POA: Diagnosis not present

## 2016-09-15 DIAGNOSIS — M48061 Spinal stenosis, lumbar region without neurogenic claudication: Secondary | ICD-10-CM | POA: Diagnosis not present

## 2016-09-15 DIAGNOSIS — M5126 Other intervertebral disc displacement, lumbar region: Secondary | ICD-10-CM

## 2016-09-15 NOTE — Telephone Encounter (Signed)
Pt is requesting a call back. He says that he has spoken with you several times today. Thanks

## 2016-09-19 ENCOUNTER — Other Ambulatory Visit: Payer: Self-pay | Admitting: Neurosurgery

## 2016-09-19 DIAGNOSIS — M5416 Radiculopathy, lumbar region: Secondary | ICD-10-CM

## 2016-09-22 ENCOUNTER — Ambulatory Visit
Admission: RE | Admit: 2016-09-22 | Discharge: 2016-09-22 | Disposition: A | Discharge status: Home / Self Care | Payer: PPO | Source: Ambulatory Visit | Attending: Neurosurgery | Admitting: Neurosurgery

## 2016-09-22 DIAGNOSIS — M5416 Radiculopathy, lumbar region: Secondary | ICD-10-CM

## 2016-09-22 DIAGNOSIS — M5127 Other intervertebral disc displacement, lumbosacral region: Secondary | ICD-10-CM | POA: Diagnosis not present

## 2016-09-22 MED ORDER — METHYLPREDNISOLONE ACETATE 40 MG/ML INJ SUSP (RADIOLOG
120.0000 mg | Freq: Once | INTRAMUSCULAR | Status: AC
  Administered 2016-09-22: 120 mg via EPIDURAL

## 2016-09-22 MED ORDER — IOPAMIDOL (ISOVUE-M 200) INJECTION 41%
1.0000 mL | Freq: Once | INTRAMUSCULAR | Status: AC
  Administered 2016-09-22: 1 mL via EPIDURAL

## 2016-09-22 NOTE — Discharge Instructions (Signed)

## 2016-09-27 ENCOUNTER — Other Ambulatory Visit: Payer: PPO

## 2016-09-27 ENCOUNTER — Ambulatory Visit (INDEPENDENT_AMBULATORY_CARE_PROVIDER_SITE_OTHER): Payer: PPO

## 2016-09-27 VITALS — BP 146/86 | HR 72 | Temp 98.4°F | Ht 68.0 in | Wt 180.0 lb

## 2016-09-27 DIAGNOSIS — Z0000 Encounter for general adult medical examination without abnormal findings: Secondary | ICD-10-CM

## 2016-09-27 NOTE — Progress Notes (Signed)
Subjective:   Jeffery Davis is a 71 y.o. male who presents for Medicare Annual/Subsequent preventive examination.  Review of Systems:  N/A  Cardiac Risk Factors include: advanced age (>39men, >25 women);dyslipidemia;hypertension;male gender;sedentary lifestyle     Objective:    Vitals: BP (!) 146/86 (BP Location: Right Arm)   Pulse 72   Temp 98.4 F (36.9 C) (Oral)   Ht 5\' 8"  (1.727 m)   Wt 180 lb (81.6 kg)   BMI 27.37 kg/m   Body mass index is 27.37 kg/m.  Tobacco History  Smoking Status  . Never Smoker  Smokeless Tobacco  . Never Used     Counseling given: Not Answered   Past Medical History:  Diagnosis Date  . Actinic keratitis   . Allergy   . Arthritis    degenerative  . Benign fibroma of prostate   . Erectile dysfunction   . GERD (gastroesophageal reflux disease)   . Hyperlipidemia   . Lumbar herniated disc   . Primary localized osteoarthrosis of left shoulder 03/01/2016   Past Surgical History:  Procedure Laterality Date  . CARDIAC CATHETERIZATION  2005   [facility]  . CATARACT EXTRACTION  06/2002  . COLONOSCOPY    . ESOPHAGOGASTRODUODENOSCOPY (EGD) WITH PROPOFOL N/A 01/08/2016   Procedure: ESOPHAGOGASTRODUODENOSCOPY (EGD) WITH PROPOFOL;  Surgeon: Manya Silvas, MD;  Location[facility]COPY;  Service: Endoscopy;  Laterality: N/A;  . KNEE ARTHROSCOPY Right   . SHOULDER SURGERY Left 2010  . TOTAL SHOULDER ARTHROPLASTY Left 03/01/2016   Procedure: REVERSE TOTAL SHOULDER ARTHROPLASTY;  Surgeon: Marchia Bond, MD;  Location: [place];  Service: Orthopedics;  Laterality: Left;   Family History  Problem Relation Age of Onset  . Cancer Mother        breast and pancreatic  . CAD Father   . Hypertension Father   . Healthy Daughter   . Healthy Son    History  Sexual Activity  . Sexual activity: Not on file    Outpatient Encounter Prescriptions as of 09/27/2016  Medication Sig  . atorvastatin (LIPITOR) 10 MG tablet Take 1 tablet (10 mg  total) by mouth daily. (Patient taking differently: Take 10 mg by mouth every other day. )  . Cholecalciferol (VITAMIN D3) 5000 units TABS Take 5,000 Units by mouth daily.  . Coenzyme Q10 (CO Q 10) 10 MG CAPS Take 1 capsule by mouth 2 (two) times daily.   Marland Kitchen glucosamine-chondroitin 500-400 MG tablet Take 1 tablet by mouth 2 (two) times daily.   Marland Kitchen omeprazole (PRILOSEC) 20 MG capsule TAKE 1 CAPSULE BY MOUTH EVERY DAY (Patient taking differently: 1 capsule po QOD)  . sildenafil (REVATIO) 20 MG tablet Take 1 tablet (20 mg total) by mouth 3 (three) times daily as needed (ED).  . Ascorbic Acid (VITAMIN C) 1000 MG tablet Take 1,000 mg by mouth daily.   . [DISCONTINUED] celecoxib (CELEBREX) 200 MG capsule Take 1 capsule (200 mg total) by mouth daily. (Patient not taking: Reported on 08/22/2016)  . [DISCONTINUED] cyclobenzaprine (FLEXERIL) 10 MG tablet Take 1 tablet (10 mg total) by mouth at bedtime. (Patient not taking: Reported on 08/22/2016)  . [DISCONTINUED] predniSONE (DELTASONE) 10 MG tablet Take 6 tablets for 2 days, 5 tablets for 2 days, 4 tablets for 2 days, 3 tablets for 2 days, 2 tablets for 2 days, then 1 tablet for 2 days  . [DISCONTINUED] traMADol (ULTRAM) 50 MG tablet Take 1 tablet (50 mg total) by mouth every 4 (four) hours as needed.   No  facility-administered encounter medications on file as of 09/27/2016.     Activities of Daily Living In your present state of health, do you have any difficulty performing the following activities: 09/27/2016 02/19/2016  Hearing? Y N  Vision? N N  Difficulty concentrating or making decisions? N N  Walking or climbing stairs? N N  Dressing or bathing? N N  Doing errands, shopping? N N  Preparing Food and eating ? N -  Using the Toilet? N -  In the past six months, have you accidently leaked urine? N -  Do you have problems with loss of bowel control? N -  Managing your Medications? N -  Managing your Finances? N -  Housekeeping or managing your  Housekeeping? N -  Some recent data might be hidden    Patient Care Team: Jerrol Banana., MD as PCP - General (Family Medicine) Idelle Leech, [place] as Consulting Physician (Optometry) Meade Maw, MD as Consulting Physician (Neurosurgery) [place], MD as Consulting Physician (Radiology) Ralene Bathe, MD as Consulting Physician (Dermatology)   Assessment:     Exercise Activities and Dietary recommendations Current Exercise Habits: The patient does not participate in regular exercise at present, Exercise limited by: orthopedic condition(s) (back pain, previously active)  Goals    . Reduce salt intake to 2 grams per day or less          Reduce salt intake to 2 grams per day or less      Fall Risk Fall Risk  09/27/2016 09/29/2015 11/10/2014  Falls in the past year? No Yes No  Number falls in past yr: - 1 -  Injury with Fall? - Yes -  Risk for fall due to : - History of fall(s) -  Follow up - Falls evaluation completed -   Depression Screen PHQ 2/9 Scores 09/27/2016 09/27/2016 09/29/2015 11/10/2014  PHQ - 2 Score 0 0 0 0  PHQ- 9 Score 0 - - -    Cognitive Function     6CIT Screen 09/27/2016  What Year? 0 points  What month? 0 points  What time? 0 points  Count back from 20 0 points  Months in reverse 0 points  Repeat phrase 2 points  Total Score 2    Immunization History  Administered Date(s) Administered  . Influenza, High Dose Seasonal PF 12/08/2015  . Influenza,inj,Quad PF,36+ Mos 01/07/2014  . Pneumococcal Conjugate-13 11/10/2014  . Pneumococcal Polysaccharide-23 10/06/2011  . Td 12/22/2003  . Tdap 10/06/2011  . Zoster 03/14/2010   Screening Tests Health Maintenance  Topic Date Due  . INFLUENZA VACCINE  10/12/2016  . COLONOSCOPY  01/08/2019  . TETANUS/TDAP  10/05/2021  . Hepatitis C Screening  Completed  . PNA vac Low Risk Adult  Completed      Plan:  I have personally reviewed and addressed the Medicare Annual Wellness  questionnaire and have noted the following in the patient's chart:  A. Medical and social history B. Use of alcohol, tobacco or illicit drugs  C. Current medications and supplements D. Functional ability and status E.  Nutritional status F.  Physical activity G. Advance directives H. List of other physicians I.  Hospitalizations, surgeries, and ER visits in previous 12 months J.  [place] such as hearing and vision if needed, cognitive and depression L. Referrals and appointments - none  In addition, I have reviewed and discussed with patient certain preventive protocols, quality metrics, and best practice recommendations. A written personalized care plan for preventive  services as well as general preventive health recommendations were provided to patient.  See attached scanned questionnaire for additional information.   Signed,  Fabio Neighbors, LPN Nurse Health Advisor   MD Recommendations: None.

## 2016-09-27 NOTE — Patient Instructions (Signed)
Jeffery Davis , Thank you for taking time to come for your Medicare Wellness Visit. I appreciate your ongoing commitment to your health goals. Please review the following plan we discussed and let me know if I can assist you in the future.   Screening recommendations/referrals: Colonoscopy: completed 01/08/16 Recommended yearly ophthalmology/optometry visit for glaucoma screening and checkup Recommended yearly dental visit for hygiene and checkup  Vaccinations: Influenza vaccine: due 11/2016 Pneumococcal vaccine: completed series Tdap vaccine: completed 10/06/11, due 09/2021 Shingles vaccine: completed in 2012  Advanced directives: Already on file  Conditions/risks identified: Reduce salt intake to 2 grams per day or less  Next appointment: 10/03/16 @ 9:00 AM  Preventive Care 71 Years and Older, Male Preventive care refers to lifestyle choices and visits with your health care provider that can promote health and wellness. What does preventive care include?  A yearly physical exam. This is also called an annual well check.  Dental exams once or twice a year.  Routine eye exams. Ask your health care provider how often you should have your eyes checked.  Personal lifestyle choices, including:  Daily care of your teeth and gums.  Regular physical activity.  Eating a healthy diet.  Avoiding tobacco and drug use.  Limiting alcohol use.  Practicing safe sex.  Taking low doses of aspirin every day.  Taking vitamin and mineral supplements as recommended by your health care provider. What happens during an annual well check? The services and screenings done by your health care provider during your annual well check will depend on your age, overall health, lifestyle risk factors, and family history of disease. Counseling  Your health care provider may ask you questions about your:  Alcohol use.  Tobacco use.  Drug use.  Emotional well-being.  Home and relationship  well-being.  Sexual activity.  Eating habits.  History of falls.  Memory and ability to understand (cognition).  Work and work Statistician. Screening  You may have the following tests or measurements:  Height, weight, and BMI.  Blood pressure.  Lipid and cholesterol levels. These may be checked every 5 years, or more frequently if you are over 62 years old.  Skin check.  Lung cancer screening. You may have this screening every year starting at age 71 if you have a 30-pack-year history of smoking and currently smoke or have quit within the past 15 years.  Fecal occult blood test (FOBT) of the stool. You may have this test every year starting at age 71.  Flexible sigmoidoscopy or colonoscopy. You may have a sigmoidoscopy every 5 years or a colonoscopy every 10 years starting at age 71.  Prostate cancer screening. Recommendations will vary depending on your family history and other risks.  Hepatitis C blood test.  Hepatitis B blood test.  Sexually transmitted disease (STD) testing.  Diabetes screening. This is done by checking your blood sugar (glucose) after you have not eaten for a while (fasting). You may have this done every 1-3 years.  Abdominal aortic aneurysm (AAA) screening. You may need this if you are a current or former smoker.  Osteoporosis. You may be screened starting at age 71 if you are at high risk. Talk with your health care provider about your test results, treatment options, and if necessary, the need for more tests. Vaccines  Your health care provider may recommend certain vaccines, such as:  Influenza vaccine. This is recommended every year.  Tetanus, diphtheria, and acellular pertussis (Tdap, Td) vaccine. You may need a Td booster every  10 years.  Zoster vaccine. You may need this after age 70.  Pneumococcal 13-valent conjugate (PCV13) vaccine. One dose is recommended after age 71.  Pneumococcal polysaccharide (PPSV23) vaccine. One dose is  recommended after age 71. Talk to your health care provider about which screenings and vaccines you need and how often you need them. This information is not intended to replace advice given to you by your health care provider. Make sure you discuss any questions you have with your health care provider. Document Released: 03/27/2015 Document Revised: 11/18/2015 Document Reviewed: 12/30/2014 Elsevier Interactive Patient Education  2017 [place] Prevention in the Home Falls can cause injuries. They can happen to people of all ages. There are many things you can do to make your home safe and to help prevent falls. What can I do on the outside of my home?  Regularly fix the edges of walkways and driveways and fix any cracks.  Remove anything that might make you trip as you walk through a door, such as a raised step or threshold.  Trim any bushes or trees on the path to your home.  Use bright outdoor lighting.  Clear any walking paths of anything that might make someone trip, such as rocks or tools.  Regularly check to see if handrails are loose or broken. Make sure that both sides of any steps have handrails.  Any raised decks and porches should have guardrails on the edges.  Have any leaves, snow, or ice cleared regularly.  Use sand or salt on walking paths during winter.  Clean up any spills in your garage right away. This includes oil or grease spills. What can I do in the bathroom?  Use night lights.  Install grab bars by the toilet and in the tub and shower. Do not use towel bars as grab bars.  Use non-skid mats or decals in the tub or shower.  If you need to sit down in the shower, use a plastic, non-slip stool.  Keep the floor dry. Clean up any water that spills on the floor as soon as it happens.  Remove soap buildup in the tub or shower regularly.  Attach bath mats securely with double-sided non-slip rug tape.  Do not have throw rugs and other things on  the floor that can make you trip. What can I do in the bedroom?  Use night lights.  Make sure that you have a light by your bed that is easy to reach.  Do not use any sheets or blankets that are too big for your bed. They should not hang down onto the floor.  Have a firm chair that has side arms. You can use this for support while you get dressed.  Do not have throw rugs and other things on the floor that can make you trip. What can I do in the kitchen?  Clean up any spills right away.  Avoid walking on wet floors.  Keep items that you use a lot in easy-to-reach places.  If you need to reach something above you, use a strong step stool that has a grab bar.  Keep electrical cords out of the way.  Do not use floor polish or wax that makes floors slippery. If you must use wax, use non-skid floor wax.  Do not have throw rugs and other things on the floor that can make you trip. What can I do with my stairs?  Do not leave any items on the stairs.  Make sure  that there are handrails on both sides of the stairs and use them. Fix handrails that are broken or loose. Make sure that handrails are as long as the stairways.  Check any carpeting to make sure that it is firmly attached to the stairs. Fix any carpet that is loose or worn.  Avoid having throw rugs at the top or bottom of the stairs. If you do have throw rugs, attach them to the floor with carpet tape.  Make sure that you have a light switch at the top of the stairs and the bottom of the stairs. If you do not have them, ask someone to add them for you. What else can I do to help prevent falls?  Wear shoes that:  Do not have high heels.  Have rubber bottoms.  Are comfortable and fit you well.  Are closed at the toe. Do not wear sandals.  If you use a stepladder:  Make sure that it is fully opened. Do not climb a closed stepladder.  Make sure that both sides of the stepladder are locked into place.  Ask someone to  hold it for you, if possible.  Clearly mark and make sure that you can see:  Any grab bars or handrails.  First and last steps.  Where the edge of each step is.  Use tools that help you move around (mobility aids) if they are needed. These include:  Canes.  Walkers.  Scooters.  Crutches.  Turn on the lights when you go into a dark area. Replace any light bulbs as soon as they burn out.  Set up your furniture so you have a clear path. Avoid moving your furniture around.  If any of your floors are uneven, fix them.  If there are any pets around you, be aware of where they are.  Review your medicines with your doctor. Some medicines can make you feel dizzy. This can increase your chance of falling. Ask your doctor what other things that you can do to help prevent falls. This information is not intended to replace advice given to you by your health care provider. Make sure you discuss any questions you have with your health care provider. Document Released: 12/25/2008 Document Revised: 08/06/2015 Document Reviewed: 04/04/2014 Elsevier Interactive Patient Education  2017 [employer].

## 2016-10-03 ENCOUNTER — Ambulatory Visit (INDEPENDENT_AMBULATORY_CARE_PROVIDER_SITE_OTHER): Payer: PPO | Admitting: Family Medicine

## 2016-10-03 VITALS — BP 138/80 | HR 66 | Temp 97.8°F | Resp 16 | Ht 67.0 in | Wt 179.0 lb

## 2016-10-03 DIAGNOSIS — L570 Actinic keratosis: Secondary | ICD-10-CM | POA: Diagnosis not present

## 2016-10-03 DIAGNOSIS — M5116 Intervertebral disc disorders with radiculopathy, lumbar region: Secondary | ICD-10-CM | POA: Diagnosis not present

## 2016-10-03 DIAGNOSIS — L820 Inflamed seborrheic keratosis: Secondary | ICD-10-CM | POA: Diagnosis not present

## 2016-10-03 DIAGNOSIS — L565 Disseminated superficial actinic porokeratosis (DSAP): Secondary | ICD-10-CM | POA: Diagnosis not present

## 2016-10-03 DIAGNOSIS — Z0000 Encounter for general adult medical examination without abnormal findings: Secondary | ICD-10-CM

## 2016-10-03 DIAGNOSIS — Z1211 Encounter for screening for malignant neoplasm of colon: Secondary | ICD-10-CM

## 2016-10-03 DIAGNOSIS — M545 Low back pain: Secondary | ICD-10-CM | POA: Diagnosis not present

## 2016-10-03 DIAGNOSIS — L821 Other seborrheic keratosis: Secondary | ICD-10-CM | POA: Diagnosis not present

## 2016-10-03 DIAGNOSIS — M5416 Radiculopathy, lumbar region: Secondary | ICD-10-CM | POA: Diagnosis not present

## 2016-10-03 LAB — IFOBT (OCCULT BLOOD): IFOBT: NEGATIVE

## 2016-10-03 LAB — POCT URINALYSIS DIPSTICK
Bilirubin, UA: NEGATIVE
Blood, UA: NEGATIVE
Glucose, UA: NEGATIVE
Ketones, UA: NEGATIVE
Leukocytes, UA: NEGATIVE
Nitrite, UA: NEGATIVE
Protein, UA: NEGATIVE
Spec Grav, UA: 1.01 (ref 1.010–1.025)
Urobilinogen, UA: 0.2 E.U./dL
pH, UA: 6 (ref 5.0–8.0)

## 2016-10-03 NOTE — Progress Notes (Signed)
Patient: Jeffery Davis, Male    DOB: April 28, 1945, 70 y.o.   MRN: [id number] Visit Date: 10/03/2016  Today's Provider: [name], MD   Chief Complaint  Patient presents with  . Annual Exam   Subjective:   Jeffery Davis is a 71 y.o. male who presents today for his Subsequent Annual Wellness Visit. He feels well other than the back and shoulder. He reports exercising none at present but just now starting back, due to surgery and back. He reports he is sleeping well.  Immunization History  Administered Date(s) Administered  . Influenza, High Dose Seasonal PF 12/08/2015  . Influenza,inj,Quad PF,36+ Mos 01/07/2014  . Pneumococcal Conjugate-13 11/10/2014  . Pneumococcal Polysaccharide-23 10/06/2011  . Td 12/22/2003  . Tdap 10/06/2011  . Zoster 03/14/2010   02/24/12 Colonoscopy-Sessile serrated adenoma, diverticulosis and internal hemorrhoids.  Lab Results  Component Value Date   CHOL 166 08/25/2016   HDL 50 08/25/2016   LDLCALC 96 08/25/2016   TRIG 98 08/25/2016   CHOLHDL 2.8 09/29/2015   Lab Results  Component Value Date   CREATININE 1.10 08/25/2016   BUN 26 08/25/2016   NA 142 08/25/2016   K 4.4 08/25/2016   CL 104 08/25/2016   CO2 25 08/25/2016   CBC Latest Ref Rng & Units 08/25/2016 03/02/2016 02/19/2016  WBC 3.4 - 10.8 x10E3/uL 5.4 8.6 5.1  Hemoglobin 13.0 - 17.7 g/dL 14.4 11.4(L) 12.7(L)  Hematocrit 37.5 - 51.0 % 42.3 34.4(L) 38.3(L)  Platelets 150 - 379 x10E3/uL 221 223 301     Review of Systems  Constitutional: Negative.   HENT: Negative.   Eyes: Negative.   Respiratory: Negative.   Cardiovascular: Negative.   Gastrointestinal: Negative.   Endocrine: Negative.   Genitourinary: Negative.   Musculoskeletal: Positive for back pain.       Recovering from left shoulder surgery that was done in December. Also recently had Epidural injection for back pain.  Skin: Negative.   Allergic/Immunologic: Negative.   Neurological: Negative.   Hematological:  Negative.   Psychiatric/Behavioral: Negative.     Patient Active Problem List   Diagnosis Date Noted  . Patellofemoral stress syndrome 07/21/2016  . Acquired trigger finger 07/21/2016  . Acute deep vein thrombosis (DVT) of proximal vein of left lower extremity ([place]) 03/24/2016  . Primary localized osteoarthrosis of left shoulder 03/01/2016  . S/P shoulder replacement 03/01/2016  . Medicare annual wellness visit, subsequent 09/29/2015  . Shortness of breath 09/29/2015  . Chest tightness 09/29/2015  . Fall with injury 09/29/2015  . Actinic keratoses 08/19/2014  . Allergic rhinitis 08/19/2014  . Benign fibroma of prostate 08/19/2014  . Acid indigestion 08/19/2014  . Failure of erection 08/19/2014  . Acid reflux 08/19/2014  . Hypercholesteremia 08/19/2014  . Arthritis, degenerative 08/19/2014    Social History   Social History  . Marital status: Married    Spouse name: Coralyn Mark  . Number of children: 2  . Years of education: bachelors   Occupational History  . Retired      worked at [employer] 12 years.   .  [employer] Topics  . Smoking status: Never Smoker  . Smokeless tobacco: Never Used  . Alcohol use 3.0 - 4.2 oz/week    1 Standard drinks or equivalent, 4 - 6 Glasses of wine per week  . Drug use: No  . Sexual activity: Not on file   Other Topics Concern  . Not on file   Social  History Narrative  . No narrative on file    Past Surgical History:  Procedure Laterality Date  . CARDIAC CATHETERIZATION  2005   [facility]  . CATARACT EXTRACTION  06/2002  . COLONOSCOPY    . ESOPHAGOGASTRODUODENOSCOPY (EGD) WITH PROPOFOL N/A 01/08/2016   Procedure: ESOPHAGOGASTRODUODENOSCOPY (EGD) WITH PROPOFOL;  Surgeon: Manya Silvas, MD;  Location[facility] END[facility]COPY;  Service: Endoscopy;  Laterality: N/A;  . KNEE ARTHROSCOPY Right   . SHOULDER SURGERY Left 2010  . TOTAL SHOULDER ARTHROPLASTY Left 03/01/2016   Procedure:  REVERSE TOTAL SHOULDER ARTHROPLASTY;  Surgeon: Marchia Bond, MD;  Location: [facility];  Service: Orthopedics;  Laterality: Left;    His family history includes CAD in his father; Cancer in his mother; Healthy in his daughter and son; Hypertension in his father.     Outpatient Encounter Prescriptions as of 10/03/2016  Medication Sig  . Ascorbic Acid (VITAMIN C) 1000 MG tablet Take 1,000 mg by mouth daily.   Marland Kitchen atorvastatin (LIPITOR) 10 MG tablet Take 1 tablet (10 mg total) by mouth daily. (Patient taking differently: Take 10 mg by mouth every other day. )  . Cholecalciferol (VITAMIN D3) 5000 units TABS Take 5,000 Units by mouth daily.  . Coenzyme Q10 (CO Q 10) 10 MG CAPS Take 1 capsule by mouth 2 (two) times daily.   Marland Kitchen glucosamine-chondroitin 500-400 MG tablet Take 1 tablet by mouth 2 (two) times daily.   Marland Kitchen omeprazole (PRILOSEC) 20 MG capsule TAKE 1 CAPSULE BY MOUTH EVERY DAY (Patient taking differently: 1 capsule po QOD)  . sildenafil (REVATIO) 20 MG tablet Take 1 tablet (20 mg total) by mouth 3 (three) times daily as needed (ED).   No facility-administered encounter medications on file as of 10/03/2016.     No Known Allergies  Patient Care Team: Jerrol Banana., MD as PCP - General (Family Medicine) Idelle Leech, OD as Consulting Physician (Optometry) Meade Maw, MD as Consulting Physician (Neurosurgery) San Morelle, MD as Consulting Physician (Radiology) Ralene Bathe, MD as Consulting Physician (Dermatology)   Objective:   Vitals:  Vitals:   10/03/16 0924  BP: 138/80  Pulse: 66  Resp: 16  Temp: 97.8 F (36.6 C)  TempSrc: Oral  Weight: 179 lb (81.2 kg)  Height: 5\' 7"  (1.702 m)    Physical Exam  Constitutional: He is oriented to person, place, and time. He appears well-developed and well-nourished.  HENT:  Head: Normocephalic and atraumatic.  Right Ear: External ear normal.  Left Ear: External ear normal.  Nose: Nose normal.  Mouth/Throat:  Oropharynx is clear and moist.  Eyes: Conjunctivae and EOM are normal.  Neck: Normal range of motion. Neck supple.  Cardiovascular: Normal rate, regular rhythm, normal heart sounds and intact distal pulses.   Pulmonary/Chest: Effort normal and breath sounds normal.  Abdominal: Soft. Bowel sounds are normal.  Genitourinary: Rectum normal, prostate normal and penis normal.  Musculoskeletal: Normal range of motion.  Neurological: He is alert and oriented to person, place, and time.  Chronic anisocoria--Right 80mm    Left 31mm.  Skin: Skin is warm and dry.  Psychiatric: He has a normal mood and affect. His behavior is normal. Judgment and thought content normal.    Activities of Daily Living In your present state of health, do you have any difficulty performing the following activities: 09/27/2016 02/19/2016  Hearing? Y N  Vision? N N  Difficulty concentrating or making decisions? N N  Walking or climbing stairs? N N  Dressing or bathing? N  N  Doing errands, shopping? N N  Preparing Food and eating ? N -  Using the Toilet? N -  In the past six months, have you accidently leaked urine? N -  Do you have problems with loss of bowel control? N -  Managing your Medications? N -  Managing your Finances? N -  Housekeeping or managing your Housekeeping? N -  Some recent data might be hidden    Fall Risk Assessment Fall Risk  09/27/2016 09/29/2015 11/10/2014  Falls in the past year? No Yes No  Number falls in past yr: - 1 -  Injury with Fall? - Yes -  Risk for fall due to : - History of fall(s) -  Follow up - Falls evaluation completed -     Depression Screen PHQ 2/9 Scores 09/27/2016 09/27/2016 09/29/2015 11/10/2014  PHQ - 2 Score 0 0 0 0  PHQ- 9 Score 0 - - -   6CIT Screen 09/27/2016  What Year? 0 points  What month? 0 points  What time? 0 points  Count back from 20 0 points  Months in reverse 0 points  Repeat phrase 2 points  Total Score 2    Assessment & Plan:     Annual  Wellness Visit  Reviewed patient's Family Medical History Reviewed and updated list of patient's medical providers Assessment of cognitive impairment was done Assessed patient's functional ability Established a written schedule for health screening [place] Completed and Reviewed  Exercise Activities and Dietary recommendations Goals    . Reduce salt intake to 2 grams per day or less          Reduce salt intake to 2 grams per day or less       Immunization History  Administered Date(s) Administered  . Influenza, High Dose Seasonal PF 12/08/2015  . Influenza,inj,Quad PF,36+ Mos 01/07/2014  . Pneumococcal Conjugate-13 11/10/2014  . Pneumococcal Polysaccharide-23 10/06/2011  . Td 12/22/2003  . Tdap 10/06/2011  . Zoster 03/14/2010    Health Maintenance  Topic Date Due  . INFLUENZA VACCINE  10/12/2016  . COLONOSCOPY  01/08/2019  . TETANUS/TDAP  10/05/2021  . Hepatitis C Screening  Completed  . PNA vac Low Risk Adult  Completed     Discussed health benefits of physical activity, and encouraged him to engage in regular exercise appropriate for his age and condition.   1. Annual physical exam  - POCT Urinalysis Dipstick  2. Colon cancer screening Sessile serrated adenoma 02/2012. - IFOBT POC (occult bld, rslt in office)    HPI, Exam and A&P Transcribed under the direction and in the presence of [name]., MD. Electronically Signed: Althea Charon, RMA I have done the exam and reviewed the chart and it is accurate to the best of my knowledge. Development worker, community has been used and  any errors in dictation or transcription are unintentional. Miguel Aschoff M.D. [facility]

## 2016-10-06 DIAGNOSIS — M5416 Radiculopathy, lumbar region: Secondary | ICD-10-CM | POA: Diagnosis not present

## 2016-10-06 DIAGNOSIS — M5116 Intervertebral disc disorders with radiculopathy, lumbar region: Secondary | ICD-10-CM | POA: Diagnosis not present

## 2016-10-06 DIAGNOSIS — M545 Low back pain: Secondary | ICD-10-CM | POA: Diagnosis not present

## 2016-10-18 DIAGNOSIS — M5116 Intervertebral disc disorders with radiculopathy, lumbar region: Secondary | ICD-10-CM | POA: Diagnosis not present

## 2016-10-18 DIAGNOSIS — M5416 Radiculopathy, lumbar region: Secondary | ICD-10-CM | POA: Diagnosis not present

## 2016-10-18 DIAGNOSIS — M545 Low back pain: Secondary | ICD-10-CM | POA: Diagnosis not present

## 2016-10-20 DIAGNOSIS — M5416 Radiculopathy, lumbar region: Secondary | ICD-10-CM | POA: Diagnosis not present

## 2016-10-20 DIAGNOSIS — M545 Low back pain: Secondary | ICD-10-CM | POA: Diagnosis not present

## 2016-10-20 DIAGNOSIS — M5116 Intervertebral disc disorders with radiculopathy, lumbar region: Secondary | ICD-10-CM | POA: Diagnosis not present

## 2016-10-24 DIAGNOSIS — M5416 Radiculopathy, lumbar region: Secondary | ICD-10-CM | POA: Diagnosis not present

## 2016-10-24 DIAGNOSIS — M545 Low back pain: Secondary | ICD-10-CM | POA: Diagnosis not present

## 2016-10-24 DIAGNOSIS — M5116 Intervertebral disc disorders with radiculopathy, lumbar region: Secondary | ICD-10-CM | POA: Diagnosis not present

## 2016-11-01 ENCOUNTER — Ambulatory Visit (INDEPENDENT_AMBULATORY_CARE_PROVIDER_SITE_OTHER): Payer: PPO | Admitting: Physician Assistant

## 2016-11-01 ENCOUNTER — Encounter: Payer: Self-pay | Admitting: Physician Assistant

## 2016-11-01 VITALS — BP 122/70 | HR 56 | Temp 98.1°F | Resp 16 | Wt 177.0 lb

## 2016-11-01 DIAGNOSIS — K469 Unspecified abdominal hernia without obstruction or gangrene: Secondary | ICD-10-CM | POA: Diagnosis not present

## 2016-11-01 NOTE — Patient Instructions (Signed)
Hernia A hernia happens when an organ or tissue inside your body pushes out through a weak spot in the belly (abdomen). Follow these instructions at home:  Avoid stretching or overusing (straining) the muscles near the hernia.  Do not lift anything heavier than 10 lb (4.5 kg).  Use the muscles in your leg when you lift something up. Do not use the muscles in your back.  When you cough, try to cough gently.  Eat a diet that has a lot of fiber. Eat lots of fruits and vegetables.  Drink enough fluids to keep your pee (urine) clear or pale yellow. Try to drink 6-8 glasses of water a day.  Take medicines to make your poop soft (stool softeners) as told by your doctor.  Lose weight, if you are overweight.  Do not use any tobacco products, including cigarettes, chewing tobacco, or electronic cigarettes. If you need help quitting, ask your doctor.  Keep all follow-up visits as told by your doctor. This is important. Contact a doctor if:  The skin by the hernia gets puffy (swollen) or red.  The hernia is painful. Get help right away if:  You have a fever.  You have belly pain that is getting worse.  You feel sick to your stomach (nauseous) or you throw up (vomit).  You cannot push the hernia back in place by gently pressing on it while you are lying down.  The hernia: ? Changes in shape or size. ? Is stuck outside your belly. ? Changes color. ? Feels hard or tender. This information is not intended to replace advice given to you by your health care provider. Make sure you discuss any questions you have with your health care provider. Document Released: 08/18/2009 Document Revised: 08/06/2015 Document Reviewed: 01/08/2014 Elsevier Interactive Patient Education  2018 [employer] Inc[employer]  

## 2016-11-01 NOTE — Progress Notes (Signed)
Patient: Jeffery Davis Male    DOB: Apr 16, 1945   71 y.o.   MRN: [id number] Visit Date: 11/02/2016  Today's Provider: Trinna Post, PA-C   Chief Complaint  Patient presents with  . Abdominal Pain   Subjective:    HPI  Jeffery Davis is a 71 y/o man with history of herniated lumbar disc undergoing physical therapy who comes in today complaining for a "Lump" in his upper abdomen. He says it has been there for three weeks. It comes and goes. It mostly comes when he is trying to raise up from a supine position. He has been doing a lot of core strengthening as part of his physical therapy for his lumbar disc herniation. He denies diarrhea, constipation, nausea, vomiting.     No Known Allergies   Current Outpatient Prescriptions:  .  Ascorbic Acid (VITAMIN C) 1000 MG tablet, Take 1,000 mg by mouth daily. , Disp: , Rfl:  .  atorvastatin (LIPITOR) 10 MG tablet, Take 1 tablet (10 mg total) by mouth daily. (Patient taking differently: Take 10 mg by mouth every other day. ), Disp: 90 tablet, Rfl: 3 .  Cholecalciferol (VITAMIN D3) 5000 units TABS, Take 5,000 Units by mouth daily., Disp: , Rfl:  .  Coenzyme Q10 (CO Q 10) 10 MG CAPS, Take 1 capsule by mouth 2 (two) times daily. , Disp: , Rfl:  .  glucosamine-chondroitin 500-400 MG tablet, Take 1 tablet by mouth 2 (two) times daily. , Disp: , Rfl:  .  omeprazole (PRILOSEC) 20 MG capsule, TAKE 1 CAPSULE BY MOUTH EVERY DAY (Patient taking differently: 1 capsule po QOD), Disp: 30 capsule, Rfl: 11 .  sildenafil (REVATIO) 20 MG tablet, Take 1 tablet (20 mg total) by mouth 3 (three) times daily as needed (ED)., Disp: 90 tablet, Rfl: 3  Review of Systems  Constitutional: Negative.   Gastrointestinal: Positive for abdominal distention. Negative for abdominal pain, anal bleeding, blood in stool, constipation, diarrhea, nausea, rectal pain and vomiting.  Neurological: Negative for dizziness, light-headedness and headaches.    Social History    Substance Use Topics  . Smoking status: Never Smoker  . Smokeless tobacco: Never Used  . Alcohol use 3.0 - 4.2 oz/week    1 Standard drinks or equivalent, 4 - 6 Glasses of wine per week   Objective:   BP 122/70 (BP Location: Left Arm, Patient Position: Sitting, Cuff Size: Normal)   Pulse (!) 56   Temp 98.1 F (36.7 C) (Oral)   Resp 16   Wt 177 lb (80.3 kg)   BMI 27.72 kg/m  Vitals:   11/01/16 1636  BP: 122/70  Pulse: (!) 56  Resp: 16  Temp: 98.1 F (36.7 C)  TempSrc: Oral  Weight: 177 lb (80.3 kg)     Physical Exam  Constitutional: He is oriented to person, place, and time. He appears well-developed and well-nourished.  Cardiovascular: Normal rate.   Pulmonary/Chest: Effort normal and breath sounds normal.  Abdominal: Soft. Bowel sounds are normal. He exhibits mass. He exhibits no distension. There is no tenderness. There is no rebound and no guarding. A hernia is present. Hernia confirmed positive in the ventral area.  Palpable mass along midline epigastric region that grows in size when patient performs crunch and reduces when patient is supine.  Neurological: He is alert and oriented to person, place, and time.  Skin: Skin is warm and dry.  Psychiatric: He has a normal mood and affect. His behavior is  normal.        Assessment & Plan:     1. Non-recurrent abdominal hernia without obstruction or gangrene, unspecified hernia type  Recommend daily fiber supplement like Metamucil to keep stool soft. Recommend holding physical therapy that does active core work and also heavy lifting until he sees surgery. Counseled on s/sx of strangulation and seeking emergent care.  - Ambulatory referral to General Surgery  Return if symptoms worsen or fail to improve.  The entirety of the information documented in the History of Present Illness, Review of Systems and Physical Exam were personally obtained by me. Portions of this information were initially documented by Ashley Royalty,  CMA and reviewed by me for thoroughness and accuracy.        Trinna Post, PA-C  [facility]

## 2016-11-02 ENCOUNTER — Encounter: Payer: Self-pay | Admitting: General Surgery

## 2016-11-09 ENCOUNTER — Encounter: Payer: Self-pay | Admitting: *Deleted

## 2016-11-11 ENCOUNTER — Other Ambulatory Visit: Payer: Self-pay | Admitting: Neurological Surgery

## 2016-11-11 ENCOUNTER — Other Ambulatory Visit: Payer: Self-pay | Admitting: Neurosurgery

## 2016-11-11 DIAGNOSIS — M5416 Radiculopathy, lumbar region: Secondary | ICD-10-CM

## 2016-11-16 ENCOUNTER — Ambulatory Visit (INDEPENDENT_AMBULATORY_CARE_PROVIDER_SITE_OTHER): Payer: PPO | Admitting: General Surgery

## 2016-11-16 ENCOUNTER — Encounter: Payer: Self-pay | Admitting: General Surgery

## 2016-11-16 VITALS — BP 130/80 | HR 70 | Resp 12 | Ht 68.0 in | Wt 177.0 lb

## 2016-11-16 DIAGNOSIS — M6208 Separation of muscle (nontraumatic), other site: Secondary | ICD-10-CM | POA: Diagnosis not present

## 2016-11-16 NOTE — Progress Notes (Signed)
Patient ID: Jeffery Davis, male   DOB: 18-Jun-1945, 71 y.o.   MRN: [id number]  Chief Complaint  Patient presents with  . Other    HPI Jeffery Davis is a 71 y.o. male here today for a evaluation of a hernia. Patient states he noticed a bugle above his umbilical area about a month ago. No change in size and no pain.  HPI  Past Medical History:  Diagnosis Date  . Actinic keratitis   . Allergy   . Arthritis    degenerative  . Benign fibroma of prostate   . Erectile dysfunction   . GERD (gastroesophageal reflux disease)   . Hyperlipidemia   . Lumbar herniated disc   . Primary localized osteoarthrosis of left shoulder 03/01/2016    Past Surgical History:  Procedure Laterality Date  . CARDIAC CATHETERIZATION  2005   [facility]  . CATARACT EXTRACTION  06/2002  . COLONOSCOPY    . ESOPHAGOGASTRODUODENOSCOPY (EGD) WITH PROPOFOL N/A 01/08/2016   Procedure: ESOPHAGOGASTRODUODENOSCOPY (EGD) WITH PROPOFOL;  Surgeon: Manya Silvas, MD;  Location: [facility] ENDOSCOPY;  Service: Endoscopy;  Laterality: N/A;  . KNEE ARTHROSCOPY Right   . SHOULDER SURGERY Left 2010  . TOTAL SHOULDER ARTHROPLASTY Left 03/01/2016   Procedure: REVERSE TOTAL SHOULDER ARTHROPLASTY;  Surgeon: Marchia Bond, MD;  Location: [facility];  Service: Orthopedics;  Laterality: Left;    Family History  Problem Relation Age of Onset  . Cancer Mother        breast and pancreatic  . CAD Father   . Hypertension Father   . Healthy Daughter   . Healthy Son     Social History Social History  Substance Use Topics  . Smoking status: Never Smoker  . Smokeless tobacco: Never Used  . Alcohol use 3.0 - 4.2 oz/week    1 Standard drinks or equivalent, 4 - 6 Glasses of wine per week    No Known Allergies  Current Outpatient Prescriptions  Medication Sig Dispense Refill  . Ascorbic Acid (VITAMIN C) 1000 MG tablet Take 1,000 mg by mouth daily.     Marland Kitchen atorvastatin (LIPITOR) 10 MG tablet Take 1 tablet (10 mg total) by mouth  daily. (Patient taking differently: Take 10 mg by mouth every other day. ) 90 tablet 3  . Cholecalciferol (VITAMIN D3) 5000 units TABS Take 5,000 Units by mouth daily.    . Coenzyme Q10 (CO Q 10) 10 MG CAPS Take 1 capsule by mouth 2 (two) times daily.     Marland Kitchen glucosamine-chondroitin 500-400 MG tablet Take 1 tablet by mouth 2 (two) times daily.     Marland Kitchen omeprazole (PRILOSEC) 20 MG capsule TAKE 1 CAPSULE BY MOUTH EVERY DAY (Patient taking differently: 1 capsule po QOD) 30 capsule 11  . sildenafil (REVATIO) 20 MG tablet Take 1 tablet (20 mg total) by mouth 3 (three) times daily as needed (ED). 90 tablet 3   No current facility-administered medications for this visit.     Review of Systems Review of Systems  Constitutional: Negative.   Respiratory: Negative.   Cardiovascular: Negative.     Blood pressure 130/80, pulse 70, resp. rate 12, height 5\' 8"  (1.727 m), weight 177 lb (80.3 kg).  Physical Exam Physical Exam  Constitutional: He is oriented to person, place, and time. He appears well-developed and well-nourished.  Cardiovascular: Normal rate, regular rhythm and normal heart sounds.   Pulmonary/Chest: Effort normal and breath sounds normal.  Abdominal: Soft. Normal appearance and bowel sounds are normal. There is no tenderness.  Neurological: He is alert and oriented to person, place, and time.  Skin: Skin is warm and dry.      Assessment    Possible epigastric hernia, definite diastases recti. Asymptomatic.    Plan    Observation of present. No activity restrictions. Reassess in 3 months, earlier if he becomes symptomatic.     Patient to return in  December 2018. The patient is aware to call back for any questions or concerns.   HPI, Physical Exam, Assessment and Plan have been scribed under the direction and in the presence of Hervey Ard, MD.  Gaspar Cola, CMA   Robert Bellow 11/17/2016, 6:44 AM

## 2016-11-16 NOTE — Patient Instructions (Addendum)
Patient to return in  December 2018. The patient is aware to call back for any questions or concerns.

## 2016-11-17 ENCOUNTER — Ambulatory Visit
Admission: RE | Admit: 2016-11-17 | Discharge: 2016-11-17 | Disposition: A | Discharge status: Home / Self Care | Payer: PPO | Source: Ambulatory Visit | Attending: Neurosurgery | Admitting: Neurosurgery

## 2016-11-17 ENCOUNTER — Other Ambulatory Visit: Payer: Self-pay | Admitting: Neurosurgery

## 2016-11-17 DIAGNOSIS — M47817 Spondylosis without myelopathy or radiculopathy, lumbosacral region: Secondary | ICD-10-CM | POA: Diagnosis not present

## 2016-11-17 DIAGNOSIS — M6208 Separation of muscle (nontraumatic), other site: Secondary | ICD-10-CM | POA: Insufficient documentation

## 2016-11-17 DIAGNOSIS — M5416 Radiculopathy, lumbar region: Secondary | ICD-10-CM

## 2016-11-17 MED ORDER — IOPAMIDOL (ISOVUE-M 200) INJECTION 41%
1.0000 mL | Freq: Once | INTRAMUSCULAR | Status: AC
Start: 2016-11-17 — End: 2016-11-17
  Administered 2016-11-17: 1 mL via EPIDURAL

## 2016-11-17 MED ORDER — METHYLPREDNISOLONE ACETATE 40 MG/ML INJ SUSP (RADIOLOG
120.0000 mg | Freq: Once | INTRAMUSCULAR | Status: AC
  Administered 2016-11-17: 120 mg via EPIDURAL

## 2016-11-17 NOTE — Discharge Instructions (Signed)

## 2016-11-23 ENCOUNTER — Other Ambulatory Visit: Payer: PPO

## 2016-11-29 DIAGNOSIS — Z6826 Body mass index (BMI) 26.0-26.9, adult: Secondary | ICD-10-CM | POA: Diagnosis not present

## 2016-11-29 DIAGNOSIS — M5417 Radiculopathy, lumbosacral region: Secondary | ICD-10-CM | POA: Diagnosis not present

## 2016-11-29 DIAGNOSIS — R030 Elevated blood-pressure reading, without diagnosis of hypertension: Secondary | ICD-10-CM | POA: Diagnosis not present

## 2016-11-29 DIAGNOSIS — M5127 Other intervertebral disc displacement, lumbosacral region: Secondary | ICD-10-CM | POA: Diagnosis not present

## 2016-12-12 ENCOUNTER — Other Ambulatory Visit: Payer: Self-pay | Admitting: Neurosurgery

## 2016-12-12 DIAGNOSIS — M5127 Other intervertebral disc displacement, lumbosacral region: Secondary | ICD-10-CM

## 2016-12-27 ENCOUNTER — Ambulatory Visit
Admission: RE | Admit: 2016-12-27 | Discharge: 2016-12-27 | Disposition: A | Discharge status: Home / Self Care | Payer: PPO | Source: Ambulatory Visit | Attending: Neurosurgery | Admitting: Neurosurgery

## 2016-12-27 DIAGNOSIS — M5127 Other intervertebral disc displacement, lumbosacral region: Secondary | ICD-10-CM

## 2016-12-27 DIAGNOSIS — M5126 Other intervertebral disc displacement, lumbar region: Secondary | ICD-10-CM | POA: Diagnosis not present

## 2016-12-27 MED ORDER — DIAZEPAM 5 MG PO TABS
5.0000 mg | ORAL_TABLET | Freq: Once | ORAL | Status: AC
  Administered 2016-12-27: 5 mg via ORAL

## 2016-12-27 MED ORDER — IOPAMIDOL (ISOVUE-M 200) INJECTION 41%
15.0000 mL | Freq: Once | INTRAMUSCULAR | Status: AC
  Administered 2016-12-27: 15 mL via INTRATHECAL

## 2016-12-27 NOTE — Discharge Instructions (Signed)

## 2016-12-29 DIAGNOSIS — R030 Elevated blood-pressure reading, without diagnosis of hypertension: Secondary | ICD-10-CM | POA: Diagnosis not present

## 2016-12-29 DIAGNOSIS — M48062 Spinal stenosis, lumbar region with neurogenic claudication: Secondary | ICD-10-CM | POA: Diagnosis not present

## 2016-12-29 DIAGNOSIS — Z6826 Body mass index (BMI) 26.0-26.9, adult: Secondary | ICD-10-CM | POA: Diagnosis not present

## 2016-12-30 ENCOUNTER — Other Ambulatory Visit: Payer: Self-pay | Admitting: Neurosurgery

## 2017-01-03 ENCOUNTER — Encounter (HOSPITAL_COMMUNITY): Payer: Self-pay

## 2017-01-03 ENCOUNTER — Encounter (HOSPITAL_COMMUNITY)
Admission: RE | Admit: 2017-01-03 | Discharge: 2017-01-03 | Disposition: A | Discharge status: Home / Self Care | Payer: PPO | Source: Ambulatory Visit | Attending: Neurosurgery | Admitting: Neurosurgery

## 2017-01-03 DIAGNOSIS — M48062 Spinal stenosis, lumbar region with neurogenic claudication: Secondary | ICD-10-CM | POA: Insufficient documentation

## 2017-01-03 DIAGNOSIS — Z01812 Encounter for preprocedural laboratory examination: Secondary | ICD-10-CM | POA: Diagnosis not present

## 2017-01-03 HISTORY — DX: Spinal stenosis, lumbar region with neurogenic claudication: M48.062

## 2017-01-03 HISTORY — DX: Carpal tunnel syndrome, right upper limb: G56.01

## 2017-01-03 HISTORY — DX: Presence of external hearing-aid: Z97.4

## 2017-01-03 LAB — COMPREHENSIVE METABOLIC PANEL
ALT: 20 U/L (ref 17–63)
AST: 30 U/L (ref 15–41)
Albumin: 4.1 g/dL (ref 3.5–5.0)
Alkaline Phosphatase: 75 U/L (ref 38–126)
Anion gap: 7 (ref 5–15)
BUN: 14 mg/dL (ref 6–20)
CO2: 25 mmol/L (ref 22–32)
Calcium: 9 mg/dL (ref 8.9–10.3)
Chloride: 107 mmol/L (ref 101–111)
Creatinine, Ser: 0.86 mg/dL (ref 0.61–1.24)
GFR calc Af Amer: >60 mL/min (ref >60)
GFR calc non Af Amer: >60 mL/min (ref >60)
Glucose, Bld: 100 mg/dL — ABNORMAL HIGH (ref 65–99)
Potassium: 4.3 mmol/L (ref 3.5–5.1)
Sodium: 139 mmol/L (ref 135–145)
Total Bilirubin: 0.9 mg/dL (ref 0.3–1.2)
Total Protein: 6.4 g/dL — ABNORMAL LOW (ref 6.5–8.1)

## 2017-01-03 LAB — CBC
HCT: 39.9 % (ref 39.0–52.0)
Hemoglobin: 13 g/dL (ref 13.0–17.0)
MCH: 30.8 pg (ref 26.0–34.0)
MCHC: 32.6 g/dL (ref 30.0–36.0)
MCV: 94.5 fL (ref 78.0–100.0)
Platelets: 276 10*3/uL (ref 150–400)
RBC: 4.22 MIL/uL (ref 4.22–5.81)
RDW: 13.7 % (ref 11.5–15.5)
WBC: 4 10*3/uL (ref 4.0–10.5)

## 2017-01-03 LAB — SURGICAL PCR SCREEN
MRSA, PCR: NEGATIVE
Staphylococcus aureus: NEGATIVE

## 2017-01-03 NOTE — Pre-Procedure Instructions (Signed)
    Jeffery Davis  01/03/2017      [facility] - [place], [place] [address] Phone: [phone] Fax: [phone]   Your procedure is scheduled on Monday, January 09, 2017.  Report to [facility] Admitting at 5:30 A.M.  Call this number if you have problems the morning of surgery:  [phone]   Remember:  Do not eat food or drink liquids after midnight Sunday, January 08, 2017  Take these medicines the morning of surgery with A SIP OF WATER :omeprazole (PRILOSEC) Stop taking Aspirin, vitamins, fish oil, Coenzyme Q10, glucosamine-chondroitin and herbal medications. Do not take any NSAIDs ie: Ibuprofen, Advil, Naproxen ( Aleve), Motrin, EXCEDRIN MIGRAINE,  BC and Goody Powder or any medication containing Aspirin; stop now.  Do not wear jewelry, make-up or nail polish.  Do not wear lotions, powders, or perfumes, or deoderant.  Do not shave 48 hours prior to surgery.  Men may shave face and neck.  Do not bring valuables to the hospital.  [facility] is not responsible for any belongings or valuables.  Contacts, dentures or bridgework may not be worn into surgery.  Leave your suitcase in the car.  After surgery it may be brought to your room. For patients admitted to the hospital, discharge time will be determined by your treatment team. Patients discharged the day of surgery will not be allowed to drive home.  Special instructions: Shower the night before surgery and the morning of surgery with CHG. Please read over the following fact sheets that you were given. Pain Booklet, Coughing and Deep Breathing, MRSA Information and Surgical Site Infection Prevention

## 2017-01-03 NOTE — Progress Notes (Signed)
Pt denies SOB, chest pain, and being under the care of a cardiologist. Pt denies having an echo. Pt denies having a chest x ray within the last year. Pt denies recent labs. Pt chart forwarded to anesthesia to review EKG.

## 2017-01-08 NOTE — Anesthesia Preprocedure Evaluation (Addendum)
Anesthesia Evaluation  Patient identified by MRN, date of birth, ID band Patient awake    Reviewed: Allergy & Precautions, NPO status , Patient's Chart, lab work & pertinent test results  Airway Mallampati: II  TM Distance: >3 FB Neck ROM: Full    Dental  (+) Dental Advisory Given   Pulmonary neg pulmonary ROS,    breath sounds clear to auscultation       Cardiovascular + Peripheral Vascular Disease  negative cardio ROS   Rhythm:Regular Rate:Normal     Neuro/Psych negative neurological ROS     GI/Hepatic Neg liver ROS, GERD  ,  Endo/Other  negative endocrine ROS  Renal/GU negative Renal ROS     Musculoskeletal  (+) Arthritis ,   Abdominal   Peds  Hematology negative hematology ROS (+)   Anesthesia Other Findings   Reproductive/Obstetrics                             Lab Results  Component Value Date   WBC 4.0 01/03/2017   HGB 13.0 01/03/2017   HCT 39.9 01/03/2017   MCV 94.5 01/03/2017   PLT 276 01/03/2017   Lab Results  Component Value Date   CREATININE 0.86 01/03/2017   BUN 14 01/03/2017   NA 139 01/03/2017   K 4.3 01/03/2017   CL 107 01/03/2017   CO2 25 01/03/2017    Anesthesia Physical  Anesthesia Plan  ASA: III  Anesthesia Plan: General   Post-op Pain Management:    Induction: Intravenous  PONV Risk Score and Plan: 2 and Ondansetron, Dexamethasone, Treatment may vary due to age or medical condition and Midazolam  Airway Management Planned: Oral ETT  Additional Equipment:   Intra-op Plan:   Post-operative Plan: Extubation in OR  Informed Consent: I have reviewed the patients History and Physical, chart, labs and discussed the procedure including the risks, benefits and alternatives for the proposed anesthesia with the patient or authorized representative who has indicated his/her understanding and acceptance.   Dental advisory given  Plan Discussed  with: CRNA  Anesthesia Plan Comments: (  )        Anesthesia Quick Evaluation

## 2017-01-09 ENCOUNTER — Encounter (HOSPITAL_COMMUNITY): Payer: Self-pay | Admitting: Critical Care Medicine

## 2017-01-09 ENCOUNTER — Ambulatory Visit (HOSPITAL_COMMUNITY): Payer: PPO | Admitting: Emergency Medicine

## 2017-01-09 ENCOUNTER — Ambulatory Visit (HOSPITAL_COMMUNITY)
Admission: RE | Admit: 2017-01-09 | Discharge: 2017-01-09 | Disposition: A | Discharge status: Home / Self Care | Payer: PPO | Source: Ambulatory Visit | Attending: Neurosurgery | Admitting: Neurosurgery

## 2017-01-09 ENCOUNTER — Ambulatory Visit (HOSPITAL_COMMUNITY): Payer: PPO

## 2017-01-09 ENCOUNTER — Encounter (HOSPITAL_COMMUNITY)
Admission: RE | Disposition: A | Discharge status: Home / Self Care | Payer: Self-pay | Source: Ambulatory Visit | Attending: Neurosurgery

## 2017-01-09 ENCOUNTER — Ambulatory Visit (HOSPITAL_COMMUNITY): Payer: PPO | Admitting: Anesthesiology

## 2017-01-09 DIAGNOSIS — E785 Hyperlipidemia, unspecified: Secondary | ICD-10-CM | POA: Diagnosis not present

## 2017-01-09 DIAGNOSIS — Z79899 Other long term (current) drug therapy: Secondary | ICD-10-CM | POA: Diagnosis not present

## 2017-01-09 DIAGNOSIS — K219 Gastro-esophageal reflux disease without esophagitis: Secondary | ICD-10-CM | POA: Diagnosis not present

## 2017-01-09 DIAGNOSIS — E7800 Pure hypercholesterolemia, unspecified: Secondary | ICD-10-CM | POA: Diagnosis not present

## 2017-01-09 DIAGNOSIS — M1990 Unspecified osteoarthritis, unspecified site: Secondary | ICD-10-CM | POA: Insufficient documentation

## 2017-01-09 DIAGNOSIS — I739 Peripheral vascular disease, unspecified: Secondary | ICD-10-CM | POA: Diagnosis not present

## 2017-01-09 DIAGNOSIS — M4807 Spinal stenosis, lumbosacral region: Secondary | ICD-10-CM | POA: Diagnosis not present

## 2017-01-09 DIAGNOSIS — M5117 Intervertebral disc disorders with radiculopathy, lumbosacral region: Secondary | ICD-10-CM | POA: Diagnosis not present

## 2017-01-09 DIAGNOSIS — Z96612 Presence of left artificial shoulder joint: Secondary | ICD-10-CM | POA: Insufficient documentation

## 2017-01-09 DIAGNOSIS — M545 Low back pain: Secondary | ICD-10-CM | POA: Diagnosis not present

## 2017-01-09 DIAGNOSIS — Z8249 Family history of ischemic heart disease and other diseases of the circulatory system: Secondary | ICD-10-CM | POA: Diagnosis not present

## 2017-01-09 DIAGNOSIS — N529 Male erectile dysfunction, unspecified: Secondary | ICD-10-CM | POA: Insufficient documentation

## 2017-01-09 DIAGNOSIS — Z419 Encounter for procedure for purposes other than remedying health state, unspecified: Secondary | ICD-10-CM

## 2017-01-09 DIAGNOSIS — M48062 Spinal stenosis, lumbar region with neurogenic claudication: Secondary | ICD-10-CM | POA: Diagnosis present

## 2017-01-09 DIAGNOSIS — Z981 Arthrodesis status: Secondary | ICD-10-CM | POA: Diagnosis not present

## 2017-01-09 HISTORY — PX: LUMBAR LAMINECTOMY/DECOMPRESSION MICRODISCECTOMY: SHX5026

## 2017-01-09 SURGERY — LUMBAR LAMINECTOMY/DECOMPRESSION MICRODISCECTOMY 3 LEVELS
Anesthesia: General | Site: Back

## 2017-01-09 MED ORDER — SODIUM CHLORIDE 0.9 % IR SOLN
Status: DC | PRN
  Administered 2017-01-09: 500 mL

## 2017-01-09 MED ORDER — CEFAZOLIN SODIUM-DEXTROSE 2-4 GM/100ML-% IV SOLN
INTRAVENOUS | Status: AC
  Filled 2017-01-09: qty 100

## 2017-01-09 MED ORDER — ONDANSETRON HCL 4 MG/2ML IJ SOLN
INTRAMUSCULAR | Status: AC
  Filled 2017-01-09: qty 2

## 2017-01-09 MED ORDER — FENTANYL CITRATE (PF) 250 MCG/5ML IJ SOLN
INTRAMUSCULAR | Status: DC | PRN
  Administered 2017-01-09 (×2): 25 ug via INTRAVENOUS
  Administered 2017-01-09: 100 ug via INTRAVENOUS
  Administered 2017-01-09: 50 ug via INTRAVENOUS

## 2017-01-09 MED ORDER — CYCLOBENZAPRINE HCL 10 MG PO TABS
10.0000 mg | ORAL_TABLET | Freq: Three times a day (TID) | ORAL | Status: DC | PRN
  Administered 2017-01-09: 10 mg via ORAL

## 2017-01-09 MED ORDER — PANTOPRAZOLE SODIUM 40 MG PO TBEC: 40.0000 mg | DELAYED_RELEASE_TABLET | Freq: Every day | ORAL | Status: DC

## 2017-01-09 MED ORDER — BUPIVACAINE HCL (PF) 0.5 % IJ SOLN
INTRAMUSCULAR | Status: AC
  Filled 2017-01-09: qty 30

## 2017-01-09 MED ORDER — LIDOCAINE 2% (20 MG/ML) 5 ML SYRINGE
INTRAMUSCULAR | Status: DC | PRN
  Administered 2017-01-09: 60 mg via INTRAVENOUS

## 2017-01-09 MED ORDER — ONDANSETRON HCL 4 MG/2ML IJ SOLN
INTRAMUSCULAR | Status: DC | PRN
  Administered 2017-01-09: 4 mg via INTRAVENOUS

## 2017-01-09 MED ORDER — BISACODYL 10 MG RE SUPP: 10.0000 mg | Freq: Every day | RECTAL | Status: DC | PRN

## 2017-01-09 MED ORDER — DOCUSATE SODIUM 100 MG PO CAPS: 100.0000 mg | ORAL_CAPSULE | Freq: Two times a day (BID) | ORAL | Status: DC

## 2017-01-09 MED ORDER — PROPOFOL 10 MG/ML IV BOLUS
INTRAVENOUS | Status: AC
  Filled 2017-01-09: qty 20

## 2017-01-09 MED ORDER — MEPERIDINE HCL 25 MG/ML IJ SOLN: 6.2500 mg | INTRAMUSCULAR | Status: DC | PRN

## 2017-01-09 MED ORDER — ROCURONIUM BROMIDE 10 MG/ML (PF) SYRINGE
PREFILLED_SYRINGE | INTRAVENOUS | Status: AC
  Filled 2017-01-09: qty 10

## 2017-01-09 MED ORDER — ACETAMINOPHEN 325 MG PO TABS: 650.0000 mg | ORAL_TABLET | ORAL | Status: DC | PRN

## 2017-01-09 MED ORDER — EPHEDRINE SULFATE-NACL 50-0.9 MG/10ML-% IV SOSY
PREFILLED_SYRINGE | INTRAVENOUS | Status: DC | PRN
  Administered 2017-01-09 (×4): 5 mg via INTRAVENOUS

## 2017-01-09 MED ORDER — SODIUM CHLORIDE 0.9 % IV SOLN: 250.0000 mL | INTRAVENOUS | Status: DC

## 2017-01-09 MED ORDER — FENTANYL CITRATE (PF) 250 MCG/5ML IJ SOLN
INTRAMUSCULAR | Status: AC
  Filled 2017-01-09: qty 5

## 2017-01-09 MED ORDER — CHLORHEXIDINE GLUCONATE CLOTH 2 % EX PADS: 6.0000 | MEDICATED_PAD | Freq: Once | CUTANEOUS | Status: DC

## 2017-01-09 MED ORDER — PHENOL 1.4 % MT LIQD: 1.0000 | OROMUCOSAL | Status: DC | PRN

## 2017-01-09 MED ORDER — DEXAMETHASONE SODIUM PHOSPHATE 10 MG/ML IJ SOLN
INTRAMUSCULAR | Status: AC
  Filled 2017-01-09: qty 1

## 2017-01-09 MED ORDER — BUPIVACAINE-EPINEPHRINE (PF) 0.5% -1:200000 IJ SOLN
INTRAMUSCULAR | Status: DC | PRN
  Administered 2017-01-09 (×2): 10 mL

## 2017-01-09 MED ORDER — OXYCODONE HCL 5 MG PO TABS
10.0000 mg | ORAL_TABLET | ORAL | Status: DC | PRN
  Administered 2017-01-09: 10 mg via ORAL

## 2017-01-09 MED ORDER — FENTANYL CITRATE (PF) 100 MCG/2ML IJ SOLN
INTRAMUSCULAR | Status: AC
  Administered 2017-01-09: 50 ug via INTRAVENOUS
  Filled 2017-01-09: qty 2

## 2017-01-09 MED ORDER — BACITRACIN ZINC 500 UNIT/GM EX OINT
TOPICAL_OINTMENT | CUTANEOUS | Status: DC | PRN
  Administered 2017-01-09: 1 via TOPICAL

## 2017-01-09 MED ORDER — ACETAMINOPHEN 650 MG RE SUPP: 650.0000 mg | RECTAL | Status: DC | PRN

## 2017-01-09 MED ORDER — MIDAZOLAM HCL 2 MG/2ML IJ SOLN
INTRAMUSCULAR | Status: AC
  Filled 2017-01-09: qty 2

## 2017-01-09 MED ORDER — SUGAMMADEX SODIUM 200 MG/2ML IV SOLN
INTRAVENOUS | Status: AC
  Filled 2017-01-09: qty 2

## 2017-01-09 MED ORDER — THROMBIN (RECOMBINANT) 5000 UNITS EX SOLR
CUTANEOUS | Status: AC
Start: 2017-01-09 — End: 2017-01-09
  Filled 2017-01-09: qty 15000

## 2017-01-09 MED ORDER — CYCLOBENZAPRINE HCL 10 MG PO TABS
ORAL_TABLET | ORAL | Status: AC
  Administered 2017-01-09: 10 mg via ORAL
  Filled 2017-01-09: qty 1

## 2017-01-09 MED ORDER — MIDAZOLAM HCL 5 MG/5ML IJ SOLN
INTRAMUSCULAR | Status: DC | PRN
  Administered 2017-01-09: 2 mg via INTRAVENOUS

## 2017-01-09 MED ORDER — CEFAZOLIN SODIUM-DEXTROSE 2-4 GM/100ML-% IV SOLN
2.0000 g | Freq: Three times a day (TID) | INTRAVENOUS | Status: DC
  Administered 2017-01-09: 2 g via INTRAVENOUS
  Filled 2017-01-09: qty 100

## 2017-01-09 MED ORDER — LIDOCAINE 2% (20 MG/ML) 5 ML SYRINGE
INTRAMUSCULAR | Status: AC
  Filled 2017-01-09: qty 10

## 2017-01-09 MED ORDER — OXYCODONE HCL 5 MG PO TABS: 5.0000 mg | ORAL_TABLET | ORAL | Status: DC | PRN

## 2017-01-09 MED ORDER — LACTATED RINGERS IV SOLN
INTRAVENOUS | Status: DC | PRN
  Administered 2017-01-09 (×2): via INTRAVENOUS

## 2017-01-09 MED ORDER — DOCUSATE SODIUM 100 MG PO CAPS: 100.0000 mg | ORAL_CAPSULE | Freq: Two times a day (BID) | ORAL | 0 refills | Status: DC

## 2017-01-09 MED ORDER — FENTANYL CITRATE (PF) 100 MCG/2ML IJ SOLN
25.0000 ug | INTRAMUSCULAR | Status: DC | PRN
  Administered 2017-01-09 (×2): 50 ug via INTRAVENOUS

## 2017-01-09 MED ORDER — MENTHOL 3 MG MT LOZG: 1.0000 | LOZENGE | OROMUCOSAL | Status: DC | PRN

## 2017-01-09 MED ORDER — CYCLOBENZAPRINE HCL 10 MG PO TABS: 10.0000 mg | ORAL_TABLET | Freq: Three times a day (TID) | ORAL | 1 refills | Status: DC | PRN

## 2017-01-09 MED ORDER — EPHEDRINE 5 MG/ML INJ
INTRAVENOUS | Status: AC
  Filled 2017-01-09: qty 10

## 2017-01-09 MED ORDER — CEFAZOLIN SODIUM-DEXTROSE 2-4 GM/100ML-% IV SOLN
2.0000 g | INTRAVENOUS | Status: AC
  Administered 2017-01-09: 2 g via INTRAVENOUS

## 2017-01-09 MED ORDER — ROCURONIUM BROMIDE 10 MG/ML (PF) SYRINGE
PREFILLED_SYRINGE | INTRAVENOUS | Status: DC | PRN
  Administered 2017-01-09: 20 mg via INTRAVENOUS
  Administered 2017-01-09: 50 mg via INTRAVENOUS
  Administered 2017-01-09: 20 mg via INTRAVENOUS

## 2017-01-09 MED ORDER — MORPHINE SULFATE (PF) 4 MG/ML IV SOLN: 4.0000 mg | INTRAVENOUS | Status: DC | PRN

## 2017-01-09 MED ORDER — OXYCODONE HCL 5 MG PO TABS
ORAL_TABLET | ORAL | Status: AC
  Filled 2017-01-09: qty 2

## 2017-01-09 MED ORDER — SODIUM CHLORIDE 0.9% FLUSH: 3.0000 mL | Freq: Two times a day (BID) | INTRAVENOUS | Status: DC

## 2017-01-09 MED ORDER — BACITRACIN ZINC 500 UNIT/GM EX OINT
TOPICAL_OINTMENT | CUTANEOUS | Status: AC
  Filled 2017-01-09: qty 28.35

## 2017-01-09 MED ORDER — OXYCODONE HCL 5 MG PO TABS: 5.0000 mg | ORAL_TABLET | ORAL | 0 refills | Status: DC | PRN

## 2017-01-09 MED ORDER — ONDANSETRON HCL 4 MG PO TABS: 4.0000 mg | ORAL_TABLET | Freq: Four times a day (QID) | ORAL | Status: DC | PRN

## 2017-01-09 MED ORDER — PROPOFOL 10 MG/ML IV BOLUS
INTRAVENOUS | Status: DC | PRN
  Administered 2017-01-09: 140 mg via INTRAVENOUS

## 2017-01-09 MED ORDER — ONDANSETRON HCL 4 MG/2ML IJ SOLN: 4.0000 mg | Freq: Four times a day (QID) | INTRAMUSCULAR | Status: DC | PRN

## 2017-01-09 MED ORDER — SUGAMMADEX SODIUM 200 MG/2ML IV SOLN
INTRAVENOUS | Status: DC | PRN
  Administered 2017-01-09: 160 mg via INTRAVENOUS

## 2017-01-09 MED ORDER — ATORVASTATIN CALCIUM 20 MG PO TABS
10.0000 mg | ORAL_TABLET | Freq: Every day | ORAL | Status: DC
  Administered 2017-01-09: 10 mg via ORAL
  Filled 2017-01-09: qty 1

## 2017-01-09 MED ORDER — GELATIN ABSORBABLE MT POWD
OROMUCOSAL | Status: DC | PRN
  Administered 2017-01-09: 5 mL via TOPICAL

## 2017-01-09 MED ORDER — SODIUM CHLORIDE 0.9% FLUSH: 3.0000 mL | INTRAVENOUS | Status: DC | PRN

## 2017-01-09 SURGICAL SUPPLY — 48 items
BAG DECANTER FOR FLEXI CONT (MISCELLANEOUS) ×2 IMPLANT
BENZOIN TINCTURE PRP APPL 2/3 (GAUZE/BANDAGES/DRESSINGS) ×2 IMPLANT
BLADE CLIPPER SURG (BLADE) IMPLANT
BUR MATCHSTICK NEURO 3.0 LAGG (BURR) ×2 IMPLANT
BUR PRECISION FLUTE 6.0 (BURR) ×2 IMPLANT
CANISTER SUCT 3000ML PPV (MISCELLANEOUS) ×2 IMPLANT
CARTRIDGE OIL MAESTRO DRILL (MISCELLANEOUS) ×1 IMPLANT
DIFFUSER DRILL AIR PNEUMATIC (MISCELLANEOUS) ×2 IMPLANT
DRAPE LAPAROTOMY 100X72X124 (DRAPES) ×2 IMPLANT
DRAPE MICROSCOPE LEICA (MISCELLANEOUS) ×2 IMPLANT
DRAPE POUCH INSTRU U-SHP 10X18 (DRAPES) ×2 IMPLANT
DRAPE SURG 17X23 STRL (DRAPES) ×8 IMPLANT
ELECT BLADE 4.0 EZ CLEAN MEGAD (MISCELLANEOUS) ×2
ELECT REM PT RETURN 9FT ADLT (ELECTROSURGICAL) ×2
ELECTRODE BLDE 4.0 EZ CLN MEGD (MISCELLANEOUS) ×1 IMPLANT
ELECTRODE REM PT RTRN 9FT ADLT (ELECTROSURGICAL) ×1 IMPLANT
GAUZE SPONGE 4X4 12PLY STRL (GAUZE/BANDAGES/DRESSINGS) ×2 IMPLANT
GAUZE SPONGE 4X4 16PLY XRAY LF (GAUZE/BANDAGES/DRESSINGS) IMPLANT
GLOVE BIO SURGEON STRL SZ8 (GLOVE) ×2 IMPLANT
GLOVE BIO SURGEON STRL SZ8.5 (GLOVE) ×2 IMPLANT
GLOVE EXAM NITRILE LRG STRL (GLOVE) IMPLANT
GLOVE EXAM NITRILE XL STR (GLOVE) IMPLANT
GLOVE EXAM NITRILE XS STR PU (GLOVE) IMPLANT
GOWN STRL REUS W/ TWL LRG LVL3 (GOWN DISPOSABLE) ×2 IMPLANT
GOWN STRL REUS W/ TWL XL LVL3 (GOWN DISPOSABLE) ×1 IMPLANT
GOWN STRL REUS W/TWL 2XL LVL3 (GOWN DISPOSABLE) IMPLANT
GOWN STRL REUS W/TWL LRG LVL3 (GOWN DISPOSABLE) ×2
GOWN STRL REUS W/TWL XL LVL3 (GOWN DISPOSABLE) ×1
HEMOSTAT POWDER KIT SURGIFOAM (HEMOSTASIS) ×2 IMPLANT
KIT BASIN OR (CUSTOM PROCEDURE TRAY) ×2 IMPLANT
KIT ROOM TURNOVER OR (KITS) ×2 IMPLANT
NEEDLE HYPO 21X1.5 SAFETY (NEEDLE) IMPLANT
NEEDLE HYPO 22GX1.5 SAFETY (NEEDLE) ×2 IMPLANT
NS IRRIG 1000ML POUR BTL (IV SOLUTION) ×2 IMPLANT
OIL CARTRIDGE MAESTRO DRILL (MISCELLANEOUS) ×2
PACK LAMINECTOMY NEURO (CUSTOM PROCEDURE TRAY) ×2 IMPLANT
PAD ARMBOARD 7.5X6 YLW CONV (MISCELLANEOUS) ×6 IMPLANT
PATTIES SURGICAL .5 X1 (DISPOSABLE) IMPLANT
RUBBERBAND STERILE (MISCELLANEOUS) ×4 IMPLANT
SPONGE SURGIFOAM ABS GEL SZ50 (HEMOSTASIS) IMPLANT
STRIP CLOSURE SKIN 1/2X4 (GAUZE/BANDAGES/DRESSINGS) ×2 IMPLANT
SUT VIC AB 1 CT1 18XBRD ANBCTR (SUTURE) ×2 IMPLANT
SUT VIC AB 1 CT1 8-18 (SUTURE) ×2
SUT VIC AB 2-0 CP2 18 (SUTURE) ×4 IMPLANT
TAPE CLOTH SURG 4X10 WHT LF (GAUZE/BANDAGES/DRESSINGS) ×2 IMPLANT
TOWEL GREEN STERILE (TOWEL DISPOSABLE) ×2 IMPLANT
TOWEL GREEN STERILE FF (TOWEL DISPOSABLE) ×2 IMPLANT
WATER STERILE IRR 1000ML POUR (IV SOLUTION) ×2 IMPLANT

## 2017-01-09 NOTE — Discharge Instructions (Signed)

## 2017-01-09 NOTE — Anesthesia Postprocedure Evaluation (Signed)
Anesthesia Post Note  Patient: Jeffery Davis  Procedure(s) Performed: LAMINECTOMY AND FORAMINOTOMY LUMBAR THREE-FOUR LUMBAR FOUR-FIVE  LUMBAR FIVE SACRAL ONE (N/A Back)     Patient location during evaluation: PACU Anesthesia Type: General Level of consciousness: awake and alert Pain management: pain level controlled Vital Signs Assessment: post-procedure vital signs reviewed and stable Respiratory status: spontaneous breathing, nonlabored ventilation, respiratory function stable and patient connected to nasal cannula oxygen Cardiovascular status: blood pressure returned to baseline and stable Postop Assessment: no apparent nausea or vomiting Anesthetic complications: no    Last Vitals:  Vitals:   01/09/17 0619  BP: (!) 174/94  Pulse: 64  Resp: 20  Temp: 36.4 C  SpO2: 95%    Last Pain:  Vitals:   01/09/17 0619  TempSrc: Oral                 Coron Rossano

## 2017-01-09 NOTE — Anesthesia Procedure Notes (Signed)
Procedure Name: Intubation Date/Time: 01/09/2017 7:33 AM Performed by: Merrilyn Puma B Pre-anesthesia Checklist: Patient identified, Emergency Drugs available, Suction available, Patient being monitored and Timeout performed Patient Re-evaluated:Patient Re-evaluated prior to induction Oxygen Delivery Method: Circle system utilized Preoxygenation: Pre-oxygenation with 100% oxygen Induction Type: IV induction Ventilation: Mask ventilation without difficulty Grade View: Grade I Tube size: 7.5 mm Number of attempts: 1 Airway Equipment and Method: Stylet and Video-laryngoscopy Placement Confirmation: ETT inserted through vocal cords under direct vision,  positive ETCO2,  CO2 detector and breath sounds checked- equal and bilateral Secured at: 22 cm Tube secured with: Tape Dental Injury: Teeth and Oropharynx as per pre-operative assessment

## 2017-01-09 NOTE — H&P (Signed)
Subjective:  The patient is a 71 year old white male who has complained of back and right leg pain. He has failed medical management and was worked up with a lumbar myelo CT. This demonstrated stenosis at L3-4, L4-5 and L5-S1 on the right. I discussed the various treatment options with him. He has decided to proceed with surgery.  Past Medical History:  Diagnosis Date  . Actinic keratitis   . Allergy   . Arthritis    degenerative  . Benign fibroma of prostate   . Carpal tunnel syndrome, right   . Erectile dysfunction   . GERD (gastroesophageal reflux disease)   . Headache   . Hyperlipidemia   . Lumbar herniated disc   . Lumbar stenosis with neurogenic claudication   . Primary localized osteoarthrosis of left shoulder 03/01/2016  . Wears glasses   . Wears hearing aid in both ears     Past Surgical History:  Procedure Laterality Date  . CARDIAC CATHETERIZATION  2005   [facility]  . CATARACT EXTRACTION  06/2002  . COLONOSCOPY    . ESOPHAGOGASTRODUODENOSCOPY (EGD) WITH PROPOFOL N/A 01/08/2016   Procedure: ESOPHAGOGASTRODUODENOSCOPY (EGD) WITH PROPOFOL;  Surgeon: Manya Silvas, MD;  Location: [facility] END[facility]COPY;  Service: Endoscopy;  Laterality: N/A;  . KNEE ARTHROSCOPY Right   . SHOULDER SURGERY Left 2010  . TOTAL SHOULDER ARTHROPLASTY Left 03/01/2016   Procedure: REVERSE TOTAL SHOULDER ARTHROPLASTY;  Surgeon: Marchia Bond, MD;  Location: [facility];  Service: Orthopedics;  Laterality: Left;    No Known Allergies  Social History  Substance Use Topics  . Smoking status: Never Smoker  . Smokeless tobacco: Never Used  . Alcohol use 3.0 - 4.2 oz/week    4 - 6 Glasses of wine, 1 Standard drinks or equivalent per week     Comment: 1-2 glasses of wine daily    Family History  Problem Relation Age of Onset  . Cancer Mother        breast and pancreatic  . CAD Father   . Hypertension Father   . Healthy Daughter   . Healthy Son    Prior to Admission medications   Medication  Sig Start Date End Date Taking? Authorizing Provider  aspirin-acetaminophen-caffeine (EXCEDRIN MIGRAINE) [phone] MG tablet Take 2 tablets by mouth daily as needed for headache.   Yes [provider]  atorvastatin (LIPITOR) 10 MG tablet Take 1 tablet (10 mg total) by mouth daily. Patient taking differently: Take 10 mg by mouth every other day.  02/09/16  Yes Jerrol Banana., MD  calcium carbonate (TUMS - DOSED IN MG ELEMENTAL CALCIUM) 500 MG chewable tablet Chew 2 tablets by mouth daily as needed for indigestion or heartburn.   Yes [provider]  Cholecalciferol (VITAMIN D3) 5000 units TABS Take 5,000 Units by mouth daily.   Yes [provider]  Coenzyme Q10 (CO Q 10) 100 MG CAPS Take 100 mg by mouth daily.   Yes [provider]  glucosamine-chondroitin 500-400 MG tablet Take 1 tablet by mouth 2 (two) times daily.    Yes [provider]  GLUCOSAMINE-CHONDROITIN PO Take 1 tablet by mouth 2 (two) times daily. 1500-1200 mg   Yes [provider]  magnesium oxide (MAG-OX) 400 MG tablet Take 400 mg by mouth 2 (two) times daily.   Yes [provider]  omeprazole (PRILOSEC) 20 MG capsule TAKE 1 CAPSULE BY MOUTH EVERY DAY Patient taking differently: Take 20 mgs by mouth every other day 05/05/16  Yes Eulas Post  Brooke Bonito., MD  sildenafil (REVATIO) 20 MG tablet Take 1 tablet (20 mg total) by mouth 3 (three) times daily as needed (ED). 06/20/16  Yes Jerrol Banana., MD  vitamin C (ASCORBIC ACID) 500 MG tablet Take 500 mg by mouth 2 (two) times daily.   Yes [provider]     Review of Systems  Positive ROS: As above  All other systems have been reviewed and were otherwise negative with the exception of those mentioned in the HPI and as above.  Objective: Vital signs in last 24 hours: Temp:  [97.6 F (36.4 C)] 97.6 F (36.4 C) (10/29 0619) Pulse Rate:  [64] 64 (10/29 0619) Resp:  [20] 20 (10/29 0619) BP:  (174)/(94) 174/94 (10/29 0619) SpO2:  [95 %] 95 % (10/29 0619)  General Appearance: Alert Head: Normocephalic, without obvious abnormality, atraumatic Eyes: PERRL, conjunctiva/corneas clear, EOM's intact,    Ears: Normal  Throat: Normal  Neck: Supple, Back: unremarkable Lungs: Clear to auscultation bilaterally, respirations unlabored Heart: Regular rate and rhythm, no murmur, rub or gallop Abdomen: Soft, non-tender Extremities: Extremities normal, atraumatic, no cyanosis or edema Skin: unremarkable  NEUROLOGIC:   Mental status: alert and oriented,Motor Exam - grossly normal Sensory Exam - grossly normal Reflexes:  Coordination - grossly normal Gait - grossly normal Balance - grossly normal Cranial Nerves: I: smell Not tested  II: visual acuity  OS: Normal  OD: Normal   II: visual fields Full to confrontation  II: pupils Equal, round, reactive to light  III,VII: ptosis None  III,IV,VI: extraocular muscles  Full ROM  V: mastication Normal  V: facial light touch sensation  Normal  V,VII: corneal reflex  Present  VII: facial muscle function - upper  Normal  VII: facial muscle function - lower Normal  VIII: hearing Not tested  IX: soft palate elevation  Normal  IX,X: gag reflex Present  XI: trapezius strength  5/5  XI: sternocleidomastoid strength 5/5  XI: neck flexion strength  5/5  XII: tongue strength  Normal    Data Review Lab Results  Component Value Date   WBC 4.0 01/03/2017   HGB 13.0 01/03/2017   HCT 39.9 01/03/2017   MCV 94.5 01/03/2017   PLT 276 01/03/2017   Lab Results  Component Value Date   NA 139 01/03/2017   K 4.3 01/03/2017   CL 107 01/03/2017   CO2 25 01/03/2017   BUN 14 01/03/2017   CREATININE 0.86 01/03/2017   GLUCOSE 100 (H) 01/03/2017   No results found for: INR, PROTIME  Assessment/Plan: Right L3-4, L4-5 and L5-S1 stenosis, lumbago, lumbar radiculopathy: I have discussed the situation with the patient. I have reviewed his imaging  studies with him and pointed out the abnormalities. We have discussed the various treatment options including surgery. I have described the surgical treatment option of a right L3-4, L4-5 and L5-S1 laminectomy/laminotomy/foraminotomy. I have shown him surgical models. I have given him a surgical pamphlet. We have discussed the risks, benefits, alternatives, likelihood of achieving our goals, and the expected postoperative course. I have answered all his questions. He has decided to proceed with surgery.   Kathey Simer D 01/09/2017 7:24 AM

## 2017-01-09 NOTE — Op Note (Signed)
Brief history: The patient is a 71 year old white male who has complained of back and right leg pain consistent with a lumbar radiculopathy. He has failed medical management and was worked up with a lumbar MRI and a lumbar myelo CT. This demonstrated stenosis and herniated disc at L3-4, L4-5 and L5-S1. I discussed the situation with the patient and his wife. We discussed the various treatment options. He has decided to proceed with surgery.  Preoperative diagnosis: Right L3-4, L4-5 and L5-S1 stenosis, herniated disc, lumbago, lumbar radiculopathy, neurogenic claudication  Postoperative diagnosis: The same  Procedure: Right L3-4, L4-5 and L5-S1 laminectomy/laminotomy/foraminotomy, right L5-S1 discectomy using micro-dissection  Surgeon: Dr. Earle Gell  Asst.: Dr. Cyndy Freeze  Anesthesia: Gen. endotracheal  Estimated blood loss: 100 mL  Drains: None  Complications: None  Description of procedure: The patient was brought to the operating room by the anesthesia team. General endotracheal anesthesia was induced. The patient was turned to the prone position on the Wilson frame. The patient's lumbosacral region was then prepared with Betadine scrub and Betadine solution. Sterile drapes were applied.  I then injected the area to be incised with Marcaine with epinephrine solution. I then used a scalpel to make a linear midline incision over the L3-4, L4-5 and L5-S1 intervertebral disc space. I then used electrocautery to perform a right sided subperiosteal dissection exposing the spinous process and lamina of L3, L4, L5 and the upper sacrum. We obtained intraoperative radiograph to confirm our location. I then inserted the [facility] retractor for exposure.  We then brought the operative microscope into the field. Under its magnification and illumination we completed the microdissection. I used a high-speed drill to perform a laminotomy at L3-4, L4-5 and L5-S1 on the right. I then used a Kerrison punches  to widen the laminotomy and removed the ligamentum flavum at L3-4, L4-5, and L5-S1 and to complete the right L5 hemilaminectomy. We then used microdissection to free up the thecal sac and the right L4, L5 and S1 nerve root from the epidural tissue. I then used a Kerrison punch to perform a foraminotomy at about the right L4, L5 and S1 nerve root. We then using the nerve root retractor to gently retract the thecal sac and the right S1 nerve root medially. This exposed a disc herniation. We identified the ruptured disc and remove it with the pituitary forceps. We inspected the intervertebral discs at L3-4, L4-5 and L5-S1. We did not seen a large holes in the annulus nor impending herniations and therefore did not perform an intervertebral discectomy.  I then palpated along the ventral surface of the thecal sac and along exit route of the right L4, L5 and S1 nerve root and noted that the neural structures were well decompressed. This completed the decompression.  We then obtained hemostasis using bipolar electrocautery. We irrigated the wound out with bacitracin solution. We then removed the retractor. We then reapproximated the patient's thoracolumbar fascia with interrupted #1 Vicryl suture. We then reapproximated the patient's subcutaneous tissue with interrupted 2-0 Vicryl suture. We then reapproximated patient's skin with Steri-Strips and benzoin. The was then coated with bacitracin ointment. The drapes were removed. The patient was subsequently returned to the supine position where they were extubated by the anesthesia team. The patient was then transported to the postanesthesia care unit in stable condition. All sponge instrument and needle counts were reportedly correct at the end of this case.

## 2017-01-09 NOTE — Progress Notes (Signed)
Pt doing well. Pt and wife given D/C instructions with Rx's, verbal understanding was provided. Pt's IV was removed prior to D/C. Pt's incision is clean and dry with no sign of infection. Pt D/C'd home via wheelchair @ 1745 per MD order. Pt is stable @ D/C and has no other needs at this time. Holli Humbles, RN

## 2017-01-09 NOTE — Progress Notes (Signed)
Patient ID: Jeffery Davis, male   DOB: [birth year], 71 y.o.   MRN: [id number] Subjective:  The patient is alert and pleasant. He looks well.  Objective: Vital signs in last 24 hours: Temp:  [97.6 F (36.4 C)-98 F (36.7 C)] 98 F (36.7 C) (10/29 0945) Pulse Rate:  [63-68] 68 (10/29 1045) Resp:  [16-20] 20 (10/29 1045) BP: (127-174)/(67-94) 128/68 (10/29 1030) SpO2:  [95 %-98 %] 96 % (10/29 1045)  Intake/Output from previous day: No intake/output data recorded. Intake/Output this shift: Total I/O In: 1400 [I.V.:1400] Out: 100 [Blood:100]  Physical exam the patient is alert and pleasant. His strength is grossly normal his lower extremities.  Lab Results: No results for input(s): WBC, HGB, HCT, PLT in the last 72 hours. BMET No results for input(s): NA, K, CL, CO2, GLUCOSE, BUN, CREATININE, CALCIUM in the last 72 hours.  Studies/Results: Dg Lumbar Spine 1 View  Result Date: 01/09/2017 CLINICAL DATA:  L3-4, L4-5, and L5-S1 laminectomy/ foraminotomy. Surgery, elective. EXAM: LUMBAR SPINE - 1 VIEW COMPARISON:  Lumbar myelogram 12/27/2016. FINDINGS: A single intraoperative view of the lumbar spine demonstrates a surgical probe directed at the L4-5 level. A surgical sponge is noted just below this. IMPRESSION: Intraoperative localization of the L4-5 level. Electronically Signed   By: San Morelle M.D.   On: 01/09/2017 10:33    Assessment/Plan: The patient is doing well. I spoke with his wife.  LOS: 0 days     Sylina Henion D 01/09/2017, 10:58 AM

## 2017-01-09 NOTE — Evaluation (Signed)
Physical Therapy Evaluation Patient Details Name: FARAZ PONCIANO MRN: [id number] DOB: 1945/06/16 Today's Date: 01/09/2017   History of Present Illness  Patient is a 71 y/o male admitted with SS L3-4, L4-5, L5-S1 now s/p Right L3-4, L4-5 and L5-S1 laminectomy/laminotomy/foraminotomy, right L5-S1 discectomy using micro-dissection.  PMH positive for GERD, HLD, L TSA 12/17.  Clinical Impression  Patient presents with mobility close to baseline.  Educated on back precautions with mobility and with ADL's.  No further skilled PT needs at this time.  Will sign off and encouraged to walk with nursing.     Follow Up Recommendations No PT follow up    Equipment Recommendations  None recommended by PT    Recommendations for Other Services       Precautions / Restrictions Precautions Precautions: Back Precaution Booklet Issued: Yes (comment) Restrictions Weight Bearing Restrictions: No      Mobility  Bed Mobility Overal bed mobility: Needs Assistance Bed Mobility: Sidelying to Sit   Sidelying to sit: Supervision       General bed mobility comments: performed with HOB flat and no rail cues for technique  Transfers Overall transfer level: Modified independent                  Ambulation/Gait Ambulation/Gait assistance: Independent Ambulation Distance (Feet): 300 Feet Assistive device: None Gait Pattern/deviations: Step-through pattern     General Gait Details: slight veering from straight path during ambulation  Stairs Stairs: Yes Stairs assistance: Supervision Stair Management: One rail Right;Alternating pattern;Forwards Number of Stairs: 10 General stair comments: min cues for technique  Wheelchair Mobility    Modified Rankin (Stroke Patients Only)       Balance Overall balance assessment: Needs assistance   Sitting balance-Leahy Scale: Normal       Standing balance-Leahy Scale: Good                               Pertinent Vitals/Pain  Pain Assessment: Faces Faces Pain Scale: Hurts little more Pain Location: back at incision Pain Descriptors / Indicators: Discomfort;Sore Pain Intervention(s): Monitored during session;Repositioned    Home Living Family/patient expects to be discharged to:: Private residence Living Arrangements: Spouse/significant other Available Help at Discharge: Family Type of Home: House Home Access: Stairs to enter   Technical brewer of Steps: 2 Home Layout: Multi-level Home Equipment: None      Prior Function Level of Independence: Independent               Hand Dominance   Dominant Hand: Right    Extremity/Trunk Assessment   Upper Extremity Assessment Upper Extremity Assessment: Defer to OT evaluation    Lower Extremity Assessment Lower Extremity Assessment: Overall WFL for tasks assessed       Communication   Communication: No difficulties  Cognition Arousal/Alertness: Awake/alert Behavior During Therapy: WFL for tasks assessed/performed Overall Cognitive Status: Within Functional Limits for tasks assessed                                        General Comments General comments (skin integrity, edema, etc.): wife present, educated in car transfers, basic ADL and sleeping positions with back precautions with handout given, pt also demonstrated squat position near railing in stairs with S    Exercises     Assessment/Plan    PT Assessment Patent does not need any further  PT services  PT Problem List         PT Treatment Interventions      PT Goals (Current goals can be found in the Care Plan section)  Acute Rehab PT Goals Patient Stated Goal: To go home PT Goal Formulation: All assessment and education complete, DC therapy    Frequency     Barriers to discharge        Co-evaluation               AM-PAC PT "6 Clicks" Daily Activity  Outcome Measure Difficulty turning over in bed (including adjusting bedclothes, sheets and  blankets)?: None Difficulty moving from lying on back to sitting on the side of the bed? : None Difficulty sitting down on and standing up from a chair with arms (e.g., wheelchair, bedside commode, etc,.)?: None Help needed moving to and from a bed to chair (including a wheelchair)?: None Help needed walking in hospital room?: None Help needed climbing 3-5 steps with a railing? : A Little 6 Click Score: 23    End of Session   Activity Tolerance: Patient tolerated treatment well Patient left: in bed;with call bell/phone within reach;with family/visitor present   PT Visit Diagnosis: Other abnormalities of gait and mobility (R26.89)    Time: 1430-1440 PT Time Calculation (min) (ACUTE ONLY): 10 min   Charges:   PT Evaluation $PT Eval Low Complexity: 1 Low     PT G Codes:   PT G-Codes **NOT FOR INPATIENT CLASS** Functional Assessment Tool Used: AM-PAC 6 Clicks Basic Mobility Functional Limitation: Mobility: Walking and moving around Mobility: Walking and Moving Around Current Status (U2025): At least 1 percent but less than 20 percent impaired, limited or restricted Mobility: Walking and Moving Around Goal Status [phone]): At least 1 percent but less than 20 percent impaired, limited or restricted Mobility: Walking and Moving Around Discharge Status [phone]): At least 1 percent but less than 20 percent impaired, limited or restricted    [place], [place] 01/09/2017   Reginia Naas 01/09/2017, 2:50 PM

## 2017-01-09 NOTE — Discharge Summary (Signed)
Physician Discharge Summary  Patient ID: Jeffery Davis MRN: [id number] DOB/AGE: Mar 23, 1945 71 y.o.  Admit date: 01/09/2017 Discharge date: 01/09/2017  Admission Diagnoses: Right L3-4, L4-5 and L5-S1 spinal stenosis, lumbago, lumbar radiculopathy  Discharge Diagnoses: The same and right L5-S1 herniated disc. Active Problems:   Lumbar stenosis with neurogenic claudication   Discharged Condition: good  Hospital Course: I performed a right L3-4, L4-5 and L5-S1 laminectomy/laminotomy/foraminotomy and right L5-S1 discectomy on 01/09/2017. The surgery went well.  The patient's postoperative course was unremarkable. On the day of surgery he requested discharge home. The patient, and his wife, were given written and oral discharge instructions. All their questions were answered.  Consults: Physical therapy Significant Diagnostic Studies: None Treatments: Right L3-4, L4-5 and L5-S1 laminectomy/laminotomy/foraminotomy, right L5-S1 discectomy using microdissection. Discharge Exam: Blood pressure 115/63, pulse 71, temperature 98.1 F (36.7 C), resp. rate 18, SpO2 96 %. The patient is alert and pleasant. He looks well. His strength is normal in his lower extremities.  Disposition: Home  Discharge Instructions     Remove dressing in 72 hours    Complete by:  As directed    Call MD for:  difficulty breathing, headache or visual disturbances    Complete by:  As directed    Call MD for:  extreme fatigue    Complete by:  As directed    Call MD for:  hives    Complete by:  As directed    Call MD for:  persistant dizziness or light-headedness    Complete by:  As directed    Call MD for:  persistant nausea and vomiting    Complete by:  As directed    Call MD for:  redness, tenderness, or signs of infection (pain, swelling, redness, odor or green/yellow discharge around incision site)    Complete by:  As directed    Call MD for:  severe uncontrolled pain    Complete by:  As directed    Call  MD for:  temperature >100.4    Complete by:  As directed    Diet - low sodium heart healthy    Complete by:  As directed    Discharge instructions    Complete by:  As directed    Call [phone] for a followup appointment. Take a stool softener while you are using pain medications.   Driving Restrictions    Complete by:  As directed    Do not drive for 2 weeks.   Increase activity slowly    Complete by:  As directed    Lifting restrictions    Complete by:  As directed    Do not lift more than 5 pounds. No excessive bending or twisting.   May shower / Bathe    Complete by:  As directed    He may shower after the pain she is removed 3 days after surgery. Leave the incision alone.     Allergies as of 01/09/2017   No Known Allergies     Medication List    TAKE these medications   aspirin-acetaminophen-caffeine 250-250-65 MG tablet Commonly known as:  EXCEDRIN MIGRAINE Take 2 tablets by mouth daily as needed for headache.   atorvastatin 10 MG tablet Commonly known as:  LIPITOR Take 1 tablet (10 mg total) by mouth daily. What changed:  when to take this   calcium carbonate 500 MG chewable tablet Commonly known as:  TUMS - dosed in mg elemental calcium Chew 2 tablets by mouth daily as needed for indigestion or  heartburn.   Co Q 10 100 MG Caps Take 100 mg by mouth daily.   cyclobenzaprine 10 MG tablet Commonly known as:  FLEXERIL Take 1 tablet (10 mg total) by mouth 3 (three) times daily as needed for muscle spasms.   docusate sodium 100 MG capsule Commonly known as:  COLACE Take 1 capsule (100 mg total) by mouth 2 (two) times daily.   glucosamine-chondroitin 500-400 MG tablet Take 1 tablet by mouth 2 (two) times daily.   GLUCOSAMINE-CHONDROITIN PO Take 1 tablet by mouth 2 (two) times daily. 1500-1200 mg   magnesium oxide 400 MG tablet Commonly known as:  MAG-OX Take 400 mg by mouth 2 (two) times daily.   omeprazole 20 MG capsule Commonly known as:   PRILOSEC TAKE 1 CAPSULE BY MOUTH EVERY DAY What changed:  See the new instructions.   oxyCODONE 5 MG immediate release tablet Commonly known as:  Oxy IR/ROXICODONE Take 1 tablet (5 mg total) by mouth every 4 (four) hours as needed for moderate pain ((score 4 to 6)).   sildenafil 20 MG tablet Commonly known as:  REVATIO Take 1 tablet (20 mg total) by mouth 3 (three) times daily as needed (ED).   vitamin C 500 MG tablet Commonly known as:  ASCORBIC ACID Take 500 mg by mouth 2 (two) times daily.   Vitamin D3 5000 units Tabs Take 5,000 Units by mouth daily.        SignedNewman Pies D 01/09/2017, 5:01 PM

## 2017-01-09 NOTE — Transfer of Care (Signed)
Immediate Anesthesia Transfer of Care Note  Patient: [name]  Procedure(s) Performed: LAMINECTOMY AND FORAMINOTOMY LUMBAR THREE-FOUR LUMBAR FOUR-FIVE  LUMBAR FIVE SACRAL ONE (N/A Back)  Patient Location: PACU  Anesthesia Type:General  Level of Consciousness: awake, alert  and oriented  Airway & Oxygen Therapy: Patient Spontanous Breathing and Patient connected to nasal cannula oxygen  Post-op Assessment: Report given to RN and Post -op Vital signs reviewed and stable  Post vital signs: Reviewed and stable  Last Vitals:  Vitals:   01/09/17 0619  BP: (!) 174/94  Pulse: 64  Resp: 20  Temp: 36.4 C  SpO2: 95%    Last Pain:  Vitals:   01/09/17 0619  TempSrc: Oral      Patients Stated Pain Goal: 2 (50/27/74 1287)  Complications: No apparent anesthesia complications

## 2017-01-10 ENCOUNTER — Encounter (HOSPITAL_COMMUNITY): Payer: Self-pay | Admitting: Neurosurgery

## 2017-02-16 ENCOUNTER — Ambulatory Visit: Payer: PPO | Admitting: General Surgery

## 2017-02-22 DIAGNOSIS — M19012 Primary osteoarthritis, left shoulder: Secondary | ICD-10-CM | POA: Diagnosis not present

## 2017-03-27 ENCOUNTER — Ambulatory Visit (INDEPENDENT_AMBULATORY_CARE_PROVIDER_SITE_OTHER): Payer: PPO | Admitting: Family Medicine

## 2017-03-27 ENCOUNTER — Encounter: Payer: Self-pay | Admitting: Family Medicine

## 2017-03-27 VITALS — BP 140/80 | HR 66 | Temp 97.7°F | Resp 14 | Wt 182.0 lb

## 2017-03-27 DIAGNOSIS — E7800 Pure hypercholesterolemia, unspecified: Secondary | ICD-10-CM | POA: Diagnosis not present

## 2017-03-27 DIAGNOSIS — K219 Gastro-esophageal reflux disease without esophagitis: Secondary | ICD-10-CM

## 2017-03-27 DIAGNOSIS — I824Y2 Acute embolism and thrombosis of unspecified deep veins of left proximal lower extremity: Secondary | ICD-10-CM

## 2017-03-27 NOTE — Progress Notes (Signed)
Patient: Jeffery Davis Adult    DOB: 11-27-1945   72 y.o.   MRN: [id number] Visit Date: 03/27/2017  Today's Provider: [name], MD   Chief Complaint  Patient presents with  . Hyperlipidemia  . Gastroesophageal Reflux   Subjective:    HPI  Lipid/Cholesterol, Follow-up:   Last seen for this6 months ago.  Management changes since that visit include none. . Last Lipid Panel:    Component Value Date/Time   CHOL 166 08/25/2016 0945   CHOL 173 09/30/2011 0740   TRIG 98 08/25/2016 0945   TRIG 375 (H) 09/30/2011 0740   HDL 50 08/25/2016 0945   HDL 41 09/30/2011 0740   CHOLHDL 2.8 09/29/2015 1119   VLDL 75 (H) 09/30/2011 0740   LDLCALC 96 08/25/2016 0945   LDLCALC 57 09/30/2011 0740    Risk factors for vascular disease include hypercholesterolemia  He reports good compliance with treatment. He is not having side effects.  Current exercise: walking  Wt Readings from Last 3 Encounters:  03/27/17 182 lb (82.6 kg)  01/03/17 177 lb 1.6 oz (80.3 kg)  11/16/16 177 lb (80.3 kg)    -------------------------------------------------------------------    No Known Allergies   Current Outpatient Medications:  .  aspirin-acetaminophen-caffeine (EXCEDRIN MIGRAINE) 250-250-65 MG tablet, Take 2 tablets by mouth daily as needed for headache., Disp: , Rfl:  .  atorvastatin (LIPITOR) 10 MG tablet, Take 1 tablet (10 mg total) by mouth daily. (Patient taking differently: Take 10 mg by mouth every other day. ), Disp: 90 tablet, Rfl: 3 .  calcium carbonate (TUMS - DOSED IN MG ELEMENTAL CALCIUM) 500 MG chewable tablet, Chew 2 tablets by mouth daily as needed for indigestion or heartburn., Disp: , Rfl:  .  Cholecalciferol (VITAMIN D3) 5000 units TABS, Take 5,000 Units by mouth daily., Disp: , Rfl:  .  Coenzyme Q10 (CO Q 10) 100 MG CAPS, Take 100 mg by mouth daily., Disp: , Rfl:  .  glucosamine-chondroitin 500-400 MG tablet, Take 1 tablet by mouth 2 (two) times daily. , Disp: ,  Rfl:  .  magnesium oxide (MAG-OX) 400 MG tablet, Take 400 mg by mouth 2 (two) times daily., Disp: , Rfl:  .  omeprazole (PRILOSEC) 20 MG capsule, TAKE 1 CAPSULE BY MOUTH EVERY DAY (Patient taking differently: Take 20 mgs by mouth every other day), Disp: 30 capsule, Rfl: 11 .  vitamin C (ASCORBIC ACID) 500 MG tablet, Take 500 mg by mouth 2 (two) times daily., Disp: , Rfl:  .  sildenafil (REVATIO) 20 MG tablet, Take 1 tablet (20 mg total) by mouth 3 (three) times daily as needed (ED)., Disp: 90 tablet, Rfl: 3  Review of Systems  Constitutional: Negative.   HENT: Negative.   Eyes: Negative.   Respiratory: Negative.   Cardiovascular: Negative.   Gastrointestinal: Negative.   Endocrine: Negative.   Genitourinary: Negative.   Musculoskeletal: Negative.   Skin: Negative.   Allergic/Immunologic: Negative.   Neurological: Negative.   Hematological: Negative.   Psychiatric/Behavioral: Negative.     Social History   Tobacco Use  . Smoking status: Never Smoker  . Smokeless tobacco: Never Used  Substance Use Topics  . Alcohol use: Yes    Alcohol/week: 3.0 - 4.2 oz    Types: 4 - 6 Glasses of wine, 1 Standard drinks or equivalent per week    Comment: 1-2 glasses of wine daily   Objective:   BP 140/80 (BP Location: Right Arm, Patient Position: Sitting, Cuff  Size: Normal)   Pulse 66   Temp 97.7 F (36.5 C)   Resp 14   Wt 182 lb (82.6 kg)   BMI 26.88 kg/m  Vitals:   03/27/17 0840  BP: 140/80  Pulse: 66  Resp: 14  Temp: 97.7 F (36.5 C)  Weight: 182 lb (82.6 kg)     Physical Exam  Constitutional: He is oriented to person, place, and time. He appears well-developed.  HENT:  Head: Normocephalic and atraumatic.  Eyes: Conjunctivae are normal. No scleral icterus.  Neck: No thyromegaly present.  Cardiovascular: Normal rate, regular rhythm and normal heart sounds.  Pulmonary/Chest: Effort normal and breath sounds normal.  Abdominal: Soft.  Neurological: He is alert and oriented  to person, place, and time.  Skin: Skin is warm and dry.  Psychiatric: He has a normal mood and affect. His behavior is normal. Judgment and thought content normal.        Assessment & Plan:     HLD GERD DDD        I have done the exam and reviewed the above chart and it is accurate to the best of my knowledge. Development worker, community has been used in this note in any air is in the dictation or transcription are unintentional.  [name], MD  [place]

## 2017-04-24 DIAGNOSIS — B351 Tinea unguium: Secondary | ICD-10-CM | POA: Diagnosis not present

## 2017-04-24 DIAGNOSIS — L565 Disseminated superficial actinic porokeratosis (DSAP): Secondary | ICD-10-CM | POA: Diagnosis not present

## 2017-04-24 DIAGNOSIS — Z1283 Encounter for screening for malignant neoplasm of skin: Secondary | ICD-10-CM | POA: Diagnosis not present

## 2017-04-24 DIAGNOSIS — D225 Melanocytic nevi of trunk: Secondary | ICD-10-CM | POA: Diagnosis not present

## 2017-04-24 DIAGNOSIS — B353 Tinea pedis: Secondary | ICD-10-CM | POA: Diagnosis not present

## 2017-04-24 DIAGNOSIS — L821 Other seborrheic keratosis: Secondary | ICD-10-CM | POA: Diagnosis not present

## 2017-04-24 DIAGNOSIS — L578 Other skin changes due to chronic exposure to nonionizing radiation: Secondary | ICD-10-CM | POA: Diagnosis not present

## 2017-04-24 DIAGNOSIS — L570 Actinic keratosis: Secondary | ICD-10-CM | POA: Diagnosis not present

## 2017-04-26 DIAGNOSIS — Z6827 Body mass index (BMI) 27.0-27.9, adult: Secondary | ICD-10-CM | POA: Diagnosis not present

## 2017-04-26 DIAGNOSIS — M545 Low back pain: Secondary | ICD-10-CM | POA: Diagnosis not present

## 2017-04-26 DIAGNOSIS — R030 Elevated blood-pressure reading, without diagnosis of hypertension: Secondary | ICD-10-CM | POA: Diagnosis not present

## 2017-04-26 DIAGNOSIS — M5416 Radiculopathy, lumbar region: Secondary | ICD-10-CM | POA: Diagnosis not present

## 2017-04-28 ENCOUNTER — Other Ambulatory Visit: Payer: Self-pay | Admitting: Neurosurgery

## 2017-04-28 DIAGNOSIS — M5416 Radiculopathy, lumbar region: Secondary | ICD-10-CM

## 2017-05-02 ENCOUNTER — Encounter: Payer: Self-pay | Admitting: *Deleted

## 2017-05-03 ENCOUNTER — Ambulatory Visit: Payer: PPO | Admitting: Anesthesiology

## 2017-05-03 ENCOUNTER — Encounter
Admission: RE | Disposition: A | Discharge status: Home / Self Care | Payer: Self-pay | Source: Ambulatory Visit | Attending: Unknown Physician Specialty

## 2017-05-03 ENCOUNTER — Encounter: Payer: Self-pay | Admitting: *Deleted

## 2017-05-03 ENCOUNTER — Other Ambulatory Visit: Payer: Self-pay

## 2017-05-03 ENCOUNTER — Ambulatory Visit
Admission: RE | Admit: 2017-05-03 | Discharge: 2017-05-03 | Disposition: A | Discharge status: Home / Self Care | Payer: PPO | Source: Ambulatory Visit | Attending: Unknown Physician Specialty | Admitting: Unknown Physician Specialty

## 2017-05-03 DIAGNOSIS — K5790 Diverticulosis of intestine, part unspecified, without perforation or abscess without bleeding: Secondary | ICD-10-CM | POA: Diagnosis not present

## 2017-05-03 DIAGNOSIS — K219 Gastro-esophageal reflux disease without esophagitis: Secondary | ICD-10-CM | POA: Insufficient documentation

## 2017-05-03 DIAGNOSIS — Z86010 Personal history of colonic polyps: Secondary | ICD-10-CM | POA: Diagnosis not present

## 2017-05-03 DIAGNOSIS — Z7982 Long term (current) use of aspirin: Secondary | ICD-10-CM | POA: Diagnosis not present

## 2017-05-03 DIAGNOSIS — K5730 Diverticulosis of large intestine without perforation or abscess without bleeding: Secondary | ICD-10-CM | POA: Insufficient documentation

## 2017-05-03 DIAGNOSIS — Z1211 Encounter for screening for malignant neoplasm of colon: Secondary | ICD-10-CM | POA: Diagnosis not present

## 2017-05-03 DIAGNOSIS — Z79899 Other long term (current) drug therapy: Secondary | ICD-10-CM | POA: Insufficient documentation

## 2017-05-03 DIAGNOSIS — K640 First degree hemorrhoids: Secondary | ICD-10-CM | POA: Diagnosis not present

## 2017-05-03 DIAGNOSIS — K648 Other hemorrhoids: Secondary | ICD-10-CM | POA: Diagnosis not present

## 2017-05-03 HISTORY — PX: COLONOSCOPY WITH PROPOFOL: SHX5780

## 2017-05-03 SURGERY — COLONOSCOPY WITH PROPOFOL
Anesthesia: General

## 2017-05-03 MED ORDER — MIDAZOLAM HCL 2 MG/2ML IJ SOLN
INTRAMUSCULAR | Status: DC | PRN
  Administered 2017-05-03: 1 mg via INTRAVENOUS

## 2017-05-03 MED ORDER — PROPOFOL 10 MG/ML IV BOLUS
INTRAVENOUS | Status: AC
  Filled 2017-05-03: qty 20

## 2017-05-03 MED ORDER — PROPOFOL 10 MG/ML IV BOLUS
INTRAVENOUS | Status: DC | PRN
  Administered 2017-05-03: 45 mg via INTRAVENOUS

## 2017-05-03 MED ORDER — MIDAZOLAM HCL 2 MG/2ML IJ SOLN
INTRAMUSCULAR | Status: AC
  Filled 2017-05-03: qty 2

## 2017-05-03 MED ORDER — FENTANYL CITRATE (PF) 100 MCG/2ML IJ SOLN
INTRAMUSCULAR | Status: DC | PRN
  Administered 2017-05-03: 50 ug via INTRAVENOUS

## 2017-05-03 MED ORDER — PIPERACILLIN-TAZOBACTAM 3.375 G IVPB: 3.3750 g | Freq: Once | INTRAVENOUS | Status: DC

## 2017-05-03 MED ORDER — EPHEDRINE SULFATE 50 MG/ML IJ SOLN
INTRAMUSCULAR | Status: AC
  Filled 2017-05-03: qty 1

## 2017-05-03 MED ORDER — PIPERACILLIN-TAZOBACTAM 3.375 G IVPB
INTRAVENOUS | Status: AC
  Filled 2017-05-03: qty 50

## 2017-05-03 MED ORDER — PROPOFOL 500 MG/50ML IV EMUL
INTRAVENOUS | Status: DC | PRN
  Administered 2017-05-03: 150 ug/kg/min via INTRAVENOUS

## 2017-05-03 MED ORDER — FENTANYL CITRATE (PF) 100 MCG/2ML IJ SOLN
INTRAMUSCULAR | Status: AC
  Filled 2017-05-03: qty 2

## 2017-05-03 MED ORDER — SODIUM CHLORIDE 0.9 % IV SOLN: INTRAVENOUS | Status: DC

## 2017-05-03 MED ORDER — SODIUM CHLORIDE 0.9 % IV SOLN
INTRAVENOUS | Status: DC
  Administered 2017-05-03 (×2): via INTRAVENOUS

## 2017-05-03 MED ORDER — PHENYLEPHRINE HCL 10 MG/ML IJ SOLN
INTRAMUSCULAR | Status: AC
  Filled 2017-05-03: qty 1

## 2017-05-03 NOTE — Anesthesia Preprocedure Evaluation (Signed)
Anesthesia Evaluation  Patient identified by MRN, date of birth, ID band Patient awake    Reviewed: Allergy & Precautions, H&P , NPO status , reviewed documented beta blocker date and time   Airway Mallampati: II       Dental  (+) Missing, Caps   Pulmonary    Pulmonary exam normal        Cardiovascular Normal cardiovascular exam     Neuro/Psych    GI/Hepatic GERD  Controlled,  Endo/Other    Renal/GU      Musculoskeletal   Abdominal   Peds  Hematology   Anesthesia Other Findings   Reproductive/Obstetrics                             Anesthesia Physical Anesthesia Plan  ASA: II  Anesthesia Plan: General   Post-op Pain Management:    Induction:   PONV Risk Score and Plan: 2 and Propofol infusion  Airway Management Planned:   Additional Equipment:   Intra-op Plan:   Post-operative Plan:   Informed Consent: I have reviewed the patients History and Physical, chart, labs and discussed the procedure including the risks, benefits and alternatives for the proposed anesthesia with the patient or authorized representative who has indicated his/her understanding and acceptance.   Dental Advisory Given  Plan Discussed with: CRNA  Anesthesia Plan Comments:         Anesthesia Quick Evaluation

## 2017-05-03 NOTE — Transfer of Care (Signed)
Immediate Anesthesia Transfer of Care Note  Patient: Jeffery Davis  Procedure(s) Performed: COLONOSCOPY WITH PROPOFOL (N/A )  Patient Location: PACU  Anesthesia Type:General  Level of Consciousness: sedated  Airway & Oxygen Therapy: Patient Spontanous Breathing and Patient connected to nasal cannula oxygen  Post-op Assessment: Report given to RN and Post -op Vital signs reviewed and stable  Post vital signs: Reviewed and stable  Last Vitals:  Vitals:   05/03/17 0705 05/03/17 0806  BP: (!) 152/80   Pulse: 62   Resp: 20   Temp: (!) 35.5 C (!) 35.8 C  SpO2: 99%     Last Pain:  Vitals:   05/03/17 0806  TempSrc: Tympanic  PainSc: Asleep         Complications: No apparent anesthesia complications

## 2017-05-03 NOTE — H&P (Signed)
Primary Care Physician:  Jerrol Banana., MD Primary Gastroenterologist:  Dr. Vira Agar  Pre-Procedure History & Physical: HPI:  Jeffery Davis is a 72 y.o. adult is here for an colonoscopy for personal history of colon polyps.   Past Medical History:  Diagnosis Date  . Actinic keratitis   . Allergy   . Arthritis    degenerative  . Benign fibroma of prostate   . Carpal tunnel syndrome, right   . Erectile dysfunction   . GERD (gastroesophageal reflux disease)   . Headache   . Hyperlipidemia   . Lumbar herniated disc   . Lumbar stenosis with neurogenic claudication   . Primary localized osteoarthrosis of left shoulder 03/01/2016  . Wears glasses   . Wears hearing aid in both ears     Past Surgical History:  Procedure Laterality Date  . CARDIAC CATHETERIZATION  2005   [facility]  . CATARACT EXTRACTION  06/2002  . COLONOSCOPY    . ESOPHAGOGASTRODUODENOSCOPY (EGD) WITH PROPOFOL N/A 01/08/2016   Procedure: ESOPHAGOGASTRODUODENOSCOPY (EGD) WITH PROPOFOL;  Surgeon: Manya Silvas, MD;  Location: [facility] ENDOSCOPY;  Service: Endoscopy;  Laterality: N/A;  . KNEE ARTHROSCOPY Right   . LUMBAR LAMINECTOMY/DECOMPRESSION MICRODISCECTOMY N/A 01/09/2017   Procedure: LAMINECTOMY AND FORAMINOTOMY LUMBAR THREE-FOUR LUMBAR FOUR-FIVE  LUMBAR FIVE SACRAL ONE;  Surgeon: Newman Pies, MD;  Location: [place];  Service: Neurosurgery;  Laterality: N/A;  . SHOULDER SURGERY Left 2010  . TOTAL SHOULDER ARTHROPLASTY Left 03/01/2016   Procedure: REVERSE TOTAL SHOULDER ARTHROPLASTY;  Surgeon: Marchia Bond, MD;  Location: [place];  Service: Orthopedics;  Laterality: Left;    Prior to Admission medications   Medication Sig Start Date End Date Taking? Authorizing Provider  aspirin-acetaminophen-caffeine (EXCEDRIN MIGRAINE) [phone] MG tablet Take 2 tablets by mouth daily as needed for headache.   Yes [provider]  atorvastatin (LIPITOR) 10 MG tablet Take 1 tablet (10 mg total) by  mouth daily. Patient taking differently: Take 10 mg by mouth every other day.  02/09/16  Yes Jerrol Banana., MD  calcium carbonate (TUMS - DOSED IN MG ELEMENTAL CALCIUM) 500 MG chewable tablet Chew 2 tablets by mouth daily as needed for indigestion or heartburn.   Yes [provider]  Cholecalciferol (VITAMIN D3) 5000 units TABS Take 5,000 Units by mouth daily.   Yes [provider]  Coenzyme Q10 (CO Q 10) 100 MG CAPS Take 100 mg by mouth daily.   Yes [provider]  glucosamine-chondroitin 500-400 MG tablet Take 1 tablet by mouth 2 (two) times daily.    Yes [provider]  omeprazole (PRILOSEC) 20 MG capsule TAKE 1 CAPSULE BY MOUTH EVERY DAY Patient taking differently: Take 20 mgs by mouth every other day 05/05/16  Yes Jerrol Banana., MD  vitamin C (ASCORBIC ACID) 500 MG tablet Take 500 mg by mouth 2 (two) times daily.   Yes [provider]  magnesium oxide (MAG-OX) 400 MG tablet Take 400 mg by mouth 2 (two) times daily.    [provider]  sildenafil (REVATIO) 20 MG tablet Take 1 tablet (20 mg total) by mouth 3 (three) times daily as needed (ED). 06/20/16   Jerrol Banana., MD    Allergies as of 02/27/2017  . (No Known Allergies)    Family History  Problem Relation Age of Onset  . Cancer Mother        breast and pancreatic  . CAD Father   . Hypertension Father   .  Healthy Daughter   . Healthy Son     Social History   Socioeconomic History  . Marital status: Married    Spouse name: Coralyn Mark  . Number of children: 2  . Years of education: bachelors  . Highest education level: Not on file  Social Needs  . Financial resource strain: Not on file  . Food insecurity - worry: Not on file  . Food insecurity - inability: Not on file  . Transportation needs - medical: Not on file  . Transportation needs - non-medical: Not on file  Occupational History  . Occupation: Retired     Comment: worked at [employer]  [employer] 12 years.     Employer: [employer]  Tobacco Use  . Smoking status: Never Smoker  . Smokeless tobacco: Never Used  Substance and Sexual Activity  . Alcohol use: Yes    Alcohol/week: 3.0 - 4.2 oz    Types: 4 - 6 Glasses of wine, 1 Standard drinks or equivalent per week    Comment: 1-2 glasses of wine daily  . Drug use: No  . Sexual activity: Not on file  Other Topics Concern  . Not on file  Social History Narrative  . Not on file    Review of Systems: See HPI, otherwise negative ROS  Physical Exam: BP (!) 152/80   Pulse 62   Temp (!) 95.9 F (35.5 C) (Tympanic)   Resp 20   Ht 5\' 8"  (1.727 m)   Wt 77.1 kg (170 lb)   SpO2 99%   BMI 25.85 kg/m  General:   Alert,  pleasant and cooperative in NAD Head:  Normocephalic and atraumatic. Neck:  Supple; no masses or thyromegaly. Lungs:  Clear throughout to auscultation.    Heart:  Regular rate and rhythm. Abdomen:  Soft, nontender and nondistended. Normal bowel sounds, without guarding, and without rebound.   Neurologic:  Alert and  oriented x4;  grossly normal neurologically.  Impression/Plan: Jeffery Davis is here for an colonoscopy to be performed for Personal history of colon polyps.  Risks, benefits, limitations, and alternatives regarding  colonoscopy have been reviewed with the patient.  Questions have been answered.  All parties agreeable.   Gaylyn Cheers, MD  05/03/2017, 7:40 AM

## 2017-05-03 NOTE — Anesthesia Post-op Follow-up Note (Signed)
Anesthesia QCDR form completed.        

## 2017-05-03 NOTE — Anesthesia Procedure Notes (Signed)
Date/Time: 05/03/2017 7:40 AM Performed by: Allean Found, CRNA Pre-anesthesia Checklist: Patient identified, Emergency Drugs available, Suction available, Patient being monitored and Timeout performed Patient Re-evaluated:Patient Re-evaluated prior to induction Oxygen Delivery Method: Nasal cannula Induction Type: IV induction Placement Confirmation: positive ETCO2

## 2017-05-03 NOTE — Anesthesia Postprocedure Evaluation (Signed)
Anesthesia Post Note  Patient: Jeffery Davis  Procedure(s) Performed: COLONOSCOPY WITH PROPOFOL (N/A )  Patient location during evaluation: Endoscopy Anesthesia Type: General Level of consciousness: awake and alert Pain management: pain level controlled Vital Signs Assessment: post-procedure vital signs reviewed and stable Respiratory status: spontaneous breathing, nonlabored ventilation, respiratory function stable and patient connected to nasal cannula oxygen Cardiovascular status: blood pressure returned to baseline and stable Postop Assessment: no apparent nausea or vomiting Anesthetic complications: no     Last Vitals:  Vitals:   05/03/17 0826 05/03/17 0836  BP: (!) 154/84 (!) 154/81  Pulse:    Resp:    Temp:    SpO2:      Last Pain:  Vitals:   05/03/17 0806  TempSrc: Tympanic  PainSc: Asleep                 Alphonsus Sias

## 2017-05-03 NOTE — Op Note (Signed)
[facility] Gastroenterology Patient Name: Jeffery Davis Procedure Date: 05/03/2017 7:30 AM MRN: [id number] Account #: [id number] Date of Birth: Jul 17, 1945 Admit Type: Outpatient Age: 72 Room: [facility] ENDO ROOM 4 Gender: Male Note Status: Finalized Procedure:            Colonoscopy Indications:          High risk colon cancer surveillance: Personal history                        of colonic polyps Providers:            Manya Silvas, MD Referring MD:         Janine Ores. Rosanna Randy, MD (Referring MD) Medicines:            Propofol per Anesthesia Complications:        No immediate complications. Procedure:            Pre-Anesthesia Assessment:                       - After reviewing the risks and benefits, the patient                        was deemed in satisfactory condition to undergo the                        procedure.                       After obtaining informed consent, the colonoscope was                        passed under direct vision. Throughout the procedure,                        the patient's blood pressure, pulse, and oxygen                        saturations were monitored continuously. The                        Colonoscope was introduced through the anus and                        advanced to the the cecum, identified by appendiceal                        orifice and ileocecal valve. The colonoscopy was                        performed without difficulty. The patient tolerated the                        procedure well. The quality of the bowel preparation                        was excellent. Findings:      Prep was excellent.      Many small-mouthed diverticula were found in the sigmoid colon and       descending colon.      Internal hemorrhoids were found during endoscopy. The hemorrhoids were       small and  Grade I (internal hemorrhoids that do not prolapse).      The exam was otherwise without abnormality. Impression:           -  Diverticulosis in the sigmoid colon and in the                        descending colon.                       - Internal hemorrhoids.                       - The examination was otherwise normal.                       - No specimens collected. Recommendation:       - Repeat colonoscopy in 5 years for surveillance. Manya Silvas, MD 05/03/2017 8:04:33 AM This report has been signed electronically. Number of Addenda: 0 Note Initiated On: 05/03/2017 7:30 AM Scope Withdrawal Time: 0 hours 7 minutes 58 seconds  Total Procedure Duration: 0 hours 13 minutes 16 seconds       [facility]

## 2017-05-04 ENCOUNTER — Encounter: Payer: Self-pay | Admitting: Unknown Physician Specialty

## 2017-05-05 ENCOUNTER — Ambulatory Visit
Admission: RE | Admit: 2017-05-05 | Discharge: 2017-05-05 | Disposition: A | Discharge status: Home / Self Care | Payer: PPO | Source: Ambulatory Visit | Attending: Neurosurgery | Admitting: Neurosurgery

## 2017-05-05 ENCOUNTER — Other Ambulatory Visit
Admission: RE | Admit: 2017-05-05 | Discharge: 2017-05-05 | Disposition: A | Discharge status: Home / Self Care | Payer: PPO | Source: Ambulatory Visit | Attending: Neurosurgery | Admitting: Neurosurgery

## 2017-05-05 DIAGNOSIS — M48061 Spinal stenosis, lumbar region without neurogenic claudication: Secondary | ICD-10-CM | POA: Insufficient documentation

## 2017-05-05 DIAGNOSIS — M5116 Intervertebral disc disorders with radiculopathy, lumbar region: Secondary | ICD-10-CM | POA: Insufficient documentation

## 2017-05-05 DIAGNOSIS — M5127 Other intervertebral disc displacement, lumbosacral region: Secondary | ICD-10-CM | POA: Diagnosis not present

## 2017-05-05 DIAGNOSIS — M5416 Radiculopathy, lumbar region: Secondary | ICD-10-CM

## 2017-05-05 LAB — CREATININE, SERUM
Creatinine, Ser: 0.89 mg/dL (ref 0.61–1.24)
GFR calc Af Amer: >60 mL/min (ref >60)
GFR calc non Af Amer: >60 mL/min (ref >60)

## 2017-05-05 LAB — BUN: BUN: 26 mg/dL — ABNORMAL HIGH (ref 6–20)

## 2017-05-05 MED ORDER — GADOBENATE DIMEGLUMINE 529 MG/ML IV SOLN
16.0000 mL | Freq: Once | INTRAVENOUS | Status: AC | PRN
Start: 2017-05-05 — End: 2017-05-05
  Administered 2017-05-05: 16 mL via INTRAVENOUS

## 2017-05-09 DIAGNOSIS — M5416 Radiculopathy, lumbar region: Secondary | ICD-10-CM | POA: Diagnosis not present

## 2017-05-09 DIAGNOSIS — Z6827 Body mass index (BMI) 27.0-27.9, adult: Secondary | ICD-10-CM | POA: Diagnosis not present

## 2017-05-09 DIAGNOSIS — M9983 Other biomechanical lesions of lumbar region: Secondary | ICD-10-CM | POA: Diagnosis not present

## 2017-05-09 DIAGNOSIS — R030 Elevated blood-pressure reading, without diagnosis of hypertension: Secondary | ICD-10-CM | POA: Diagnosis not present

## 2017-05-11 ENCOUNTER — Other Ambulatory Visit: Payer: Self-pay | Admitting: Neurosurgery

## 2017-05-31 ENCOUNTER — Other Ambulatory Visit: Payer: Self-pay | Admitting: Family Medicine

## 2017-05-31 DIAGNOSIS — K219 Gastro-esophageal reflux disease without esophagitis: Secondary | ICD-10-CM

## 2017-05-31 MED ORDER — OMEPRAZOLE 20 MG PO CPDR: DELAYED_RELEASE_CAPSULE | ORAL | 3 refills | Status: DC

## 2017-05-31 NOTE — Telephone Encounter (Signed)
Med sent. Pt informed

## 2017-05-31 NOTE — Telephone Encounter (Signed)
Pt requesting refill of Omperazole 20 MG sent to [facility]

## 2017-06-02 NOTE — Pre-Procedure Instructions (Signed)
Jeffery Davis  06/02/2017      [facility] - [place], [address] [place] [address] Phone: [phone] Fax: [phone]    Your procedure is scheduled on June 14, 2017.  Report to [facility] Admitting at 845 AM.  Call this number if you have problems the morning of surgery:  [phone]   Remember:  Do not eat food or drink liquids after midnight.  Take these medicines the morning of surgery with A SIP OF WATER hydrocodone-acetaminophen (norco)-if needed for pain, omeprazole (prilosec), eye drops -if needed.  7 days prior to surgery STOP taking any excedrin migraine, Aspirin (unless otherwise instructed by your surgeon), Aleve, Naproxen, Ibuprofen, Motrin, Advil, Goody's, BC's, all herbal medications, fish oil, and all vitamins  Continue all other medications as instructed by your physician except follow the above medication instructions before surgery   Do not wear jewelry.  Do not wear lotions, powders, or colognes, or deodorant.  Men may shave face and neck.  Do not bring valuables to the hospital.  [facility] is not responsible for any belongings or valuables.  Contacts, dentures or bridgework may not be worn into surgery.  Leave your suitcase in the car.  After surgery it may be brought to your room.  For patients admitted to the hospital, discharge time will be determined by your treatment team.  Patients discharged the day of surgery will not be allowed to drive home.   Jeffery Davis- Preparing For Surgery  Before surgery, you can play an important role. Because skin is not sterile, your skin needs to be as free of germs as possible. You can reduce the number of germs on your skin by washing with CHG (chlorahexidine gluconate) Soap before surgery.  CHG is an antiseptic cleaner which kills germs and bonds with the skin to continue killing germs even after washing.  Please do not use if you have an allergy to CHG  or antibacterial soaps. If your skin becomes reddened/irritated stop using the CHG.  Do not shave (including legs and underarms) for at least 48 hours prior to first CHG shower. It is OK to shave your face.  Please follow these instructions carefully.   1. Shower the NIGHT BEFORE SURGERY and the MORNING OF SURGERY with CHG.   2. If you chose to wash your hair, wash your hair first as usual with your normal shampoo.  3. After you shampoo, rinse your hair and body thoroughly to remove the shampoo.  4. Use CHG as you would any other liquid soap. You can apply CHG directly to the skin and wash gently with a scrungie or a clean washcloth.   5. Apply the CHG Soap to your body ONLY FROM THE NECK DOWN.  Do not use on open wounds or open sores. Avoid contact with your eyes, ears, mouth and genitals (private parts). Wash Face and genitals (private parts)  with your normal soap.  6. Wash thoroughly, paying special attention to the area where your surgery will be performed.  7. Thoroughly rinse your body with warm water from the neck down.  8. DO NOT shower/wash with your normal soap after using and rinsing off the CHG Soap.  9. Pat yourself dry with a CLEAN TOWEL.  10. Wear CLEAN PAJAMAS to bed the night before surgery, wear comfortable clothes the morning of surgery  11. Place CLEAN SHEETS on your bed the night of your first shower and  DO NOT SLEEP WITH PETS.  Day of Surgery: Do not apply any deodorants/lotions. Please wear clean clothes to the hospital/surgery center.    Please read over the following fact sheets that you were given. Pain Booklet, Coughing and Deep Breathing, MRSA Information and Surgical Site Infection Prevention

## 2017-06-05 ENCOUNTER — Encounter (HOSPITAL_COMMUNITY)
Admission: RE | Admit: 2017-06-05 | Discharge: 2017-06-05 | Disposition: A | Discharge status: Home / Self Care | Payer: PPO | Source: Ambulatory Visit | Attending: Neurosurgery | Admitting: Neurosurgery

## 2017-06-05 ENCOUNTER — Encounter (HOSPITAL_COMMUNITY): Payer: Self-pay | Admitting: Emergency Medicine

## 2017-06-05 ENCOUNTER — Encounter (HOSPITAL_COMMUNITY): Payer: Self-pay

## 2017-06-05 DIAGNOSIS — M48061 Spinal stenosis, lumbar region without neurogenic claudication: Secondary | ICD-10-CM | POA: Insufficient documentation

## 2017-06-05 DIAGNOSIS — Z01810 Encounter for preprocedural cardiovascular examination: Secondary | ICD-10-CM | POA: Insufficient documentation

## 2017-06-05 DIAGNOSIS — Z01812 Encounter for preprocedural laboratory examination: Secondary | ICD-10-CM | POA: Diagnosis not present

## 2017-06-05 LAB — BASIC METABOLIC PANEL
Anion gap: 11 (ref 5–15)
BUN: 24 mg/dL — ABNORMAL HIGH (ref 6–20)
CO2: 24 mmol/L (ref 22–32)
Calcium: 9.5 mg/dL (ref 8.9–10.3)
Chloride: 103 mmol/L (ref 101–111)
Creatinine, Ser: 0.89 mg/dL (ref 0.61–1.24)
GFR calc Af Amer: >60 mL/min (ref >60)
GFR calc non Af Amer: >60 mL/min (ref >60)
Glucose, Bld: 99 mg/dL (ref 65–99)
Potassium: 3.9 mmol/L (ref 3.5–5.1)
Sodium: 138 mmol/L (ref 135–145)

## 2017-06-05 LAB — TYPE AND SCREEN
ABO/RH(D): A POS
Antibody Screen: NEGATIVE

## 2017-06-05 LAB — SURGICAL PCR SCREEN
MRSA, PCR: NEGATIVE
Staphylococcus aureus: NEGATIVE

## 2017-06-05 LAB — CBC
HCT: 40.3 % (ref 39.0–52.0)
Hemoglobin: 13.2 g/dL (ref 13.0–17.0)
MCH: 28.2 pg (ref 26.0–34.0)
MCHC: 32.8 g/dL (ref 30.0–36.0)
MCV: 86.1 fL (ref 78.0–100.0)
Platelets: 277 10*3/uL (ref 150–400)
RBC: 4.68 MIL/uL (ref 4.22–5.81)
RDW: 15.2 % (ref 11.5–15.5)
WBC: 6.1 10*3/uL (ref 4.0–10.5)

## 2017-06-05 LAB — ABO/RH: ABO/RH(D): A POS

## 2017-06-05 MED ORDER — CHLORHEXIDINE GLUCONATE CLOTH 2 % EX PADS: 6.0000 | MEDICATED_PAD | Freq: Once | CUTANEOUS | Status: DC

## 2017-06-05 NOTE — Pre-Procedure Instructions (Signed)
    Marlan Steward Heathman  06/05/2017      [facility] - [place], [place] - [address] [place] [address] Phone: [phone] Fax: [phone]    Your procedure is scheduled on 06/14/17.  Report to [facility] Admitting at 845 A.M.  Call this number if you have problems the morning of surgery:  [phone]   Remember:  Do not eat food or drink liquids after midnight.  Take these medicines the morning of surgery with A SIP OF WATER --hydrocodone,prilosec   Do not wear jewelry, make-up or nail polish.  Do not wear lotions, powders, or perfumes, or deodorant.  Do not shave 48 hours prior to surgery.  Men may shave face and neck.  Do not bring valuables to the hospital.  [facility] is not responsible for any belongings or valuables.  Contacts, dentures or bridgework may not be worn into surgery.  Leave your suitcase in the car.  After surgery it may be brought to your room.  For patients admitted to the hospital, discharge time will be determined by your treatment team.  Patients discharged the day of surgery will not be allowed to drive home.   Name and phone number of your driver:   Special instructions:    Please read over the following fact sheets that you were given. MRSA Information

## 2017-06-12 ENCOUNTER — Other Ambulatory Visit: Payer: Self-pay

## 2017-06-12 ENCOUNTER — Ambulatory Visit (INDEPENDENT_AMBULATORY_CARE_PROVIDER_SITE_OTHER): Payer: PPO | Admitting: Family Medicine

## 2017-06-12 ENCOUNTER — Other Ambulatory Visit: Payer: Self-pay | Admitting: Cardiology

## 2017-06-12 VITALS — BP 160/100 | HR 84 | Temp 98.0°F | Resp 18 | Wt 177.0 lb

## 2017-06-12 DIAGNOSIS — I824Y2 Acute embolism and thrombosis of unspecified deep veins of left proximal lower extremity: Secondary | ICD-10-CM | POA: Diagnosis not present

## 2017-06-12 DIAGNOSIS — R42 Dizziness and giddiness: Secondary | ICD-10-CM | POA: Diagnosis not present

## 2017-06-12 DIAGNOSIS — R030 Elevated blood-pressure reading, without diagnosis of hypertension: Secondary | ICD-10-CM

## 2017-06-12 DIAGNOSIS — I4891 Unspecified atrial fibrillation: Secondary | ICD-10-CM | POA: Diagnosis not present

## 2017-06-12 DIAGNOSIS — R270 Ataxia, unspecified: Secondary | ICD-10-CM | POA: Diagnosis not present

## 2017-06-12 DIAGNOSIS — E7800 Pure hypercholesterolemia, unspecified: Secondary | ICD-10-CM | POA: Diagnosis not present

## 2017-06-12 NOTE — Progress Notes (Signed)
AH BOTT  MRN: [id number] DOB: 01-22-1946  Subjective:  HPI   The patient is a 72 year old male who presents today for evaluation of high blood pressure and dizziness.  He states it started about 4 days ago.  He is scheduled for surgery in 2 days.  He had his pre-op testing done 1 week ago today and states that it was all good.  He had EKG at that time and it revealed Sinus rhythm with 1st degree AV block.  Patient has continued with some elevated blood pressure since then.   3 days ago the patient did not feel well and felt dizzy and his blood pressure was elevated.  He also felt tired.  I advised him to go to the emergency room and he declined.  He feels better now and the dizziness is almost resolved.  He still does not quite feel himself.  No chest pain chest tightness or palpitation.  Patient Active Problem List   Diagnosis Date Noted  . Lumbar stenosis with neurogenic claudication 01/09/2017  . Diastasis recti 11/17/2016  . Patellofemoral stress syndrome 07/21/2016  . Acquired trigger finger 07/21/2016  . Acute deep vein thrombosis (DVT) of proximal vein of left lower extremity ([place]) 03/24/2016  . Primary localized osteoarthrosis of left shoulder 03/01/2016  . S/P shoulder replacement 03/01/2016  . Medicare annual wellness visit, subsequent 09/29/2015  . Chest tightness 09/29/2015  . Fall with injury 09/29/2015  . Actinic keratoses 08/19/2014  . Allergic rhinitis 08/19/2014  . Benign fibroma of prostate 08/19/2014  . Acid indigestion 08/19/2014  . Failure of erection 08/19/2014  . Acid reflux 08/19/2014  . Hypercholesteremia 08/19/2014  . Arthritis, degenerative 08/19/2014    Past Medical History:  Diagnosis Date  . Actinic keratitis   . Allergy   . Arthritis    degenerative  . Benign fibroma of prostate   . Carpal tunnel syndrome, right   . Erectile dysfunction   . GERD (gastroesophageal reflux disease)   . Headache   . Hyperlipidemia   . Lumbar herniated  disc   . Lumbar stenosis with neurogenic claudication   . Primary localized osteoarthrosis of left shoulder 03/01/2016  . Wears glasses   . Wears hearing aid in both ears     Social History   Socioeconomic History  . Marital status: Married    Spouse name: Coralyn Mark  . Number of children: 2  . Years of education: bachelors  . Highest education level: Not on file  Occupational History  . Occupation: Retired     Comment: worked at [employer] 12 years.     Employer: [employer]  Social Needs  . Financial resource strain: Not on file  . Food insecurity:    Worry: Not on file    Inability: Not on file  . Transportation needs:    Medical: Not on file    Non-medical: Not on file  Tobacco Use  . Smoking status: Never Smoker  . Smokeless tobacco: Never Used  Substance and Sexual Activity  . Alcohol use: Yes    Alcohol/week: 3.0 - 4.2 oz    Types: 4 - 6 Glasses of wine, 1 Standard drinks or equivalent per week    Comment: 1-2 glasses of wine daily  . Drug use: No  . Sexual activity: Not on file  Lifestyle  . Physical activity:    Days per week: Not on file    Minutes per session: Not on file  .  Stress: Not on file  Relationships  . Social connections:    Talks on phone: Not on file    Gets together: Not on file    Attends religious service: Not on file    Active member of club or organization: Not on file    Attends meetings of clubs or organizations: Not on file    Relationship status: Not on file  . Intimate partner violence:    Fear of current or ex partner: Not on file    Emotionally abused: Not on file    Physically abused: Not on file    Forced sexual activity: Not on file  Other Topics Concern  . Not on file  Social History Narrative  . Not on file    Outpatient Encounter Medications as of 06/12/2017  Medication Sig Note  . aspirin-acetaminophen-caffeine (EXCEDRIN MIGRAINE) 250-250-65 MG tablet Take 2 tablets by mouth daily as needed for  headache.   Marland Kitchen atorvastatin (LIPITOR) 10 MG tablet Take 1 tablet (10 mg total) by mouth daily. (Patient taking differently: Take 10 mg by mouth every other day. )   . HYDROcodone-acetaminophen (NORCO/VICODIN) 5-325 MG tablet Take 1 tablet by mouth 2 (two) times daily as needed for moderate pain.   Marland Kitchen ketoconazole (NIZORAL) 2 % cream Apply 1 application topically every other day.   Marland Kitchen omeprazole (PRILOSEC) 20 MG capsule TAKE 1 CAPSULE BY MOUTH EVERY DAY   . sildenafil (REVATIO) 20 MG tablet Take 1 tablet (20 mg total) by mouth 3 (three) times daily as needed (ED).   . Tetrahydrozoline HCl (VISINE OP) Place 1 drop into both eyes daily as needed (dry eyes).   . vitamin C (ASCORBIC ACID) 500 MG tablet Take 500 mg by mouth 2 (two) times daily. 05/30/2017: During winter months   . Cholecalciferol (VITAMIN D3) 5000 units TABS Take 5,000 Units by mouth daily.   . Coenzyme Q10 (CO Q 10) 100 MG CAPS Take 100 mg by mouth 2 (two) times daily.    . Doxylamine-Phenylephrine-APAP (VICKS SINEX DAYQUIL/NYQUIL PO) Take 1 Dose by mouth daily as needed (cold symptoms).   . Misc Natural Products (GLUCOSAMINE CHONDROITIN TRIPLE) TABS Take 1 tablet by mouth 2 (two) times daily.    No facility-administered encounter medications on file as of 06/12/2017.     No Known Allergies  Review of Systems  Constitutional: Negative for fever and malaise/fatigue.  Eyes: Negative.   Respiratory: Negative for cough, shortness of breath and wheezing.   Cardiovascular: Negative for chest pain, palpitations, orthopnea, claudication and leg swelling.  Gastrointestinal: Negative.   Skin: Negative.   Neurological: Positive for dizziness and weakness. Negative for headaches.  Endo/Heme/Allergies: Negative.   Psychiatric/Behavioral: Negative.     Objective:  BP (!) 160/100 (BP Location: Right Arm, Patient Position: Sitting, Cuff Size: Normal)   Pulse 84   Temp 98 F (36.7 C) (Oral)   Resp 18   Wt 177 lb (80.3 kg)   BMI 26.91 kg/m     Second reading of 160/90  Physical Exam  Constitutional: He is oriented to person, place, and time and well-developed, well-nourished, and in no distress.  HENT:  Head: Normocephalic and atraumatic.  Right Ear: External ear normal.  Left Ear: External ear normal.  Nose: Nose normal.  Mouth/Throat: Oropharynx is clear and moist.  Eyes: Conjunctivae are normal. No scleral icterus.  Right pupil constricted and nonreactive. Left pupil normal and reactive.  Neck: No thyromegaly present.  Cardiovascular: Normal rate, regular rhythm and normal heart sounds.  Pulmonary/Chest: Effort normal.  Abdominal: Soft.  Neurological: He is alert and oriented to person, place, and time. Gait normal. GCS score is 15.  Non focal exam except for chronic pupil findings.  Skin: Skin is warm and dry.  Psychiatric: Mood, memory, affect and judgment normal.    Assessment and Plan :  Dizziness/Vertigo Benign vs TIA Fluids,rest. New AFib/flutter Dr Ubaldo Glassing to see pt now Will possibly have to postopone surgery. New HTN Beta blocker vs CCB HLD   I have done the exam and reviewed the chart and it is accurate to the best of my knowledge. Development worker, community has been used and  any errors in dictation or transcription are unintentional. Miguel Aschoff M.D. [facility]

## 2017-06-13 ENCOUNTER — Ambulatory Visit
Admission: RE | Admit: 2017-06-13 | Discharge: 2017-06-13 | Disposition: A | Discharge status: Home / Self Care | Payer: PPO | Source: Ambulatory Visit | Attending: Cardiology | Admitting: Cardiology

## 2017-06-13 DIAGNOSIS — I4891 Unspecified atrial fibrillation: Secondary | ICD-10-CM | POA: Diagnosis not present

## 2017-06-13 DIAGNOSIS — R42 Dizziness and giddiness: Secondary | ICD-10-CM | POA: Insufficient documentation

## 2017-06-13 DIAGNOSIS — G319 Degenerative disease of nervous system, unspecified: Secondary | ICD-10-CM | POA: Insufficient documentation

## 2017-06-13 DIAGNOSIS — R270 Ataxia, unspecified: Secondary | ICD-10-CM | POA: Insufficient documentation

## 2017-06-14 ENCOUNTER — Inpatient Hospital Stay (HOSPITAL_COMMUNITY): Admission: RE | Admit: 2017-06-14 | Payer: PPO | Source: Ambulatory Visit | Admitting: Neurosurgery

## 2017-06-14 ENCOUNTER — Encounter (HOSPITAL_COMMUNITY): Admission: RE | Payer: Self-pay | Source: Ambulatory Visit

## 2017-06-14 SURGERY — POSTERIOR LUMBAR FUSION 1 LEVEL
Anesthesia: General

## 2017-06-19 DIAGNOSIS — I4891 Unspecified atrial fibrillation: Secondary | ICD-10-CM | POA: Diagnosis not present

## 2017-06-29 DIAGNOSIS — I428 Other cardiomyopathies: Secondary | ICD-10-CM | POA: Diagnosis not present

## 2017-06-29 DIAGNOSIS — I481 Persistent atrial fibrillation: Secondary | ICD-10-CM | POA: Diagnosis not present

## 2017-07-03 DIAGNOSIS — I429 Cardiomyopathy, unspecified: Secondary | ICD-10-CM | POA: Insufficient documentation

## 2017-07-05 DIAGNOSIS — I428 Other cardiomyopathies: Secondary | ICD-10-CM | POA: Diagnosis not present

## 2017-07-17 ENCOUNTER — Other Ambulatory Visit: Payer: Self-pay | Admitting: Cardiology

## 2017-07-17 DIAGNOSIS — I481 Persistent atrial fibrillation: Secondary | ICD-10-CM | POA: Diagnosis not present

## 2017-07-18 ENCOUNTER — Encounter: Payer: Self-pay | Admitting: *Deleted

## 2017-07-18 ENCOUNTER — Ambulatory Visit
Admission: RE | Admit: 2017-07-18 | Discharge: 2017-07-18 | Disposition: A | Discharge status: Home / Self Care | Payer: PPO | Source: Ambulatory Visit | Attending: Cardiology | Admitting: Cardiology

## 2017-07-18 ENCOUNTER — Other Ambulatory Visit: Payer: Self-pay | Admitting: Neurosurgery

## 2017-07-18 ENCOUNTER — Ambulatory Visit: Payer: PPO | Admitting: Registered Nurse

## 2017-07-18 ENCOUNTER — Encounter
Admission: RE | Disposition: A | Discharge status: Home / Self Care | Payer: Self-pay | Source: Ambulatory Visit | Attending: Cardiology

## 2017-07-18 DIAGNOSIS — Z7901 Long term (current) use of anticoagulants: Secondary | ICD-10-CM | POA: Diagnosis not present

## 2017-07-18 DIAGNOSIS — Z79899 Other long term (current) drug therapy: Secondary | ICD-10-CM | POA: Diagnosis not present

## 2017-07-18 DIAGNOSIS — Z79891 Long term (current) use of opiate analgesic: Secondary | ICD-10-CM | POA: Diagnosis not present

## 2017-07-18 DIAGNOSIS — I4891 Unspecified atrial fibrillation: Secondary | ICD-10-CM | POA: Diagnosis not present

## 2017-07-18 DIAGNOSIS — R270 Ataxia, unspecified: Secondary | ICD-10-CM | POA: Diagnosis not present

## 2017-07-18 DIAGNOSIS — N529 Male erectile dysfunction, unspecified: Secondary | ICD-10-CM | POA: Diagnosis not present

## 2017-07-18 DIAGNOSIS — K219 Gastro-esophageal reflux disease without esophagitis: Secondary | ICD-10-CM | POA: Diagnosis not present

## 2017-07-18 HISTORY — PX: CARDIOVERSION: EP1203

## 2017-07-18 HISTORY — DX: Actinic keratosis: L57.0

## 2017-07-18 SURGERY — CARDIOVERSION (CATH LAB)
Anesthesia: General

## 2017-07-18 MED ORDER — SODIUM CHLORIDE 0.9% FLUSH: 3.0000 mL | INTRAVENOUS | Status: DC | PRN

## 2017-07-18 MED ORDER — PROPOFOL 10 MG/ML IV BOLUS
INTRAVENOUS | Status: DC | PRN
  Administered 2017-07-18: 30 mg via INTRAVENOUS
  Administered 2017-07-18: 80 mg via INTRAVENOUS

## 2017-07-18 MED ORDER — SODIUM CHLORIDE 0.9% FLUSH: 3.0000 mL | Freq: Two times a day (BID) | INTRAVENOUS | Status: DC

## 2017-07-18 MED ORDER — SODIUM CHLORIDE 0.9 % IV SOLN
250.0000 mL | INTRAVENOUS | Status: DC
  Administered 2017-07-18: 1000 mL via INTRAVENOUS

## 2017-07-18 MED ORDER — PROPOFOL 10 MG/ML IV BOLUS
INTRAVENOUS | Status: AC
  Filled 2017-07-18: qty 20

## 2017-07-18 MED ORDER — SODIUM CHLORIDE 0.9 % IV SOLN: 250.0000 mL | INTRAVENOUS | Status: DC

## 2017-07-18 MED ORDER — HYDROCORTISONE 1 % EX CREA
1.0000 "application " | TOPICAL_CREAM | Freq: Three times a day (TID) | CUTANEOUS | Status: DC | PRN
  Filled 2017-07-18: qty 28

## 2017-07-18 NOTE — Op Note (Signed)
Pt underwent cardioversion to nsr with no complications.  Workup included echo showing mild to moderately reduced lv function with ef of 35-40% globally. He underwent lexiscan myoview with also showed reduced lv function with no ischemia.  Would proceed with surgery with no further cardiac workup at present. Pt will be on xarelto until 3 days prior to surgery. He is optimized for surgery.

## 2017-07-18 NOTE — Procedures (Signed)
Electrical Cardioversion Procedure Note RYNELL CIOTTI [id number] [birth year]  Procedure: Electrical Cardioversion Indications:  Atrial Fibrillation  Procedure Details Consent: Risks of procedure as well as the alternatives and risks of each were explained to the (patient/caregiver).  Consent for procedure obtained. Time Out: Verified patient identification, verified procedure, site/side was marked, verified correct patient position, special equipment/implants available, medications/allergies/relevent history reviewed, required imaging and test results available.  Performed  Patient placed on cardiac monitor, pulse oximetry, supplemental oxygen as necessary.  Sedation given: propofol Pacer pads placed anterior and posterior chest.  Cardioverted 1 time(s).  Cardioverted at 120J.  Evaluation Findings: Post procedure EKG shows: NSR Complications: None Patient did tolerate procedure well.   Teodoro Spray 07/18/2017, 7:55 AM

## 2017-07-18 NOTE — H&P (Signed)
Chief Complaint: Chief Complaint  Patient presents with  . Follow-up  had a myoview woould like the results and a game plan  . Fatigue  Im still tired  . Shortness of Breath  I do have some and its the same  Date of Service: 07/17/2017 Date of Birth: [birth year] PCP: Dwaine Gale, MD  History of Present Illness: [name] is a 72 y.o.male patient who presents for follow-up visit. Has some dyspnea and fatigue. Recently underwent a functional study which revealed an EF of 38% with no reversible ischemia. He remains in A. fib. Has been patient has no prior cardiac history of atrial fibrillation. He had an electrocardiogram done 1 week prior which showed sinus rhythm. He had transient dizziness. Brain MRI showed no CVA. Electrocardiogram revealed atrial fibrillation with a ventricular response in the 80s. Patient has remained in A. fib. No reversible ischemia on functional study. No chest pain. We will proceed with cardioversion followed by back surgery. We will continue Xarelto until surgery.  Past Medical and Surgical History  Past Medical History Past Medical History:  Diagnosis Date  . Atrial fibrillation , unspecified (CMS-HCC)  . Family history of high cholesterol   Past Surgical History He has a past surgical history that includes egd (02/23/2012); Colonoscopy (01/29/2004); Colonoscopy (02/23/2012); egd (01/08/2016); left shoulder reverse replacement (03/01/2016); and Colonoscopy (05/03/2017).   Medications and Allergies  Current Medications  Current Outpatient Medications  Medication Sig Dispense Refill  . atorvastatin (LIPITOR) 10 MG tablet Take by mouth.  Marland Kitchen HYDROcodone-acetaminophen (NORCO) 5-325 mg tablet  . omeprazole (PRILOSEC) 20 MG DR capsule TAKE 1 CAPSULE BY MOUTH EVERY DAY  . rivaroxaban (XARELTO) 20 mg tablet Take 1 tablet (20 mg total) by mouth once daily 30 tablet 11  . sildenafil, antihypertensive, (REVATIO) 20 mg tablet Take by mouth.   No current  facility-administered medications for this visit.   Allergies: Patient has no known allergies.  Social and Family History  Social History reports that he has never smoked. He has never used smokeless tobacco. He reports that he drinks alcohol.  Family History History reviewed. No pertinent family history.  Review of Systems  Review of Systems  Constitutional: Negative for chills, diaphoresis, fever, malaise/fatigue and weight loss.  HENT: Negative for congestion, ear discharge, hearing loss and tinnitus.  Eyes: Positive for blurred vision.  Respiratory: Negative for cough, hemoptysis, sputum production, shortness of breath and wheezing.  Cardiovascular: Negative for chest pain, palpitations, orthopnea, claudication, leg swelling and PND.  Gastrointestinal: Negative for abdominal pain, blood in stool, constipation, diarrhea, heartburn, melena, nausea and vomiting.  Genitourinary: Negative for dysuria, frequency, hematuria and urgency.  Musculoskeletal: Negative for back pain, falls, joint pain and myalgias.  Skin: Negative for itching and rash.  Neurological: Positive for dizziness. Negative for tingling, focal weakness, loss of consciousness, weakness and headaches.  Ataxia  Endo/Heme/Allergies: Negative for polydipsia. Does not bruise/bleed easily.  Psychiatric/Behavioral: Negative for depression, memory loss and substance abuse. The patient is not nervous/anxious.   Physical Examination   Vitals:BP 132/80  Pulse 76  Resp 12  Ht 172.7 cm (5\' 8" )  Wt 79.4 kg (175 lb)  BMI 26.61 kg/m  Ht:172.7 cm (5\' 8" ) Wt:79.4 kg (175 lb) IHK:VQQV surface area is 1.95 meters squared. Body mass index is 26.61 kg/m.  Wt Readings from Last 3 Encounters:  07/17/17 79.4 kg (175 lb)  06/29/17 80.6 kg (177 lb 12.8 oz)  06/12/17 79.4 kg (175 lb)   BP Readings from Last 3 Encounters:  07/17/17 132/80  06/29/17 150/84  06/12/17 140/82   General appearance appears in no acute distress  Head  Mouth and Eye exam Normocephalic, without obvious abnormality, atraumatic Dentition is good Eyes appear anicteric   Neck exam Thyroid: normal  Nodes: no obvious adenopathy  LUNGS Breath Sounds: Normal Percussion: Normal  CARDIOVASCULAR JVP CV wave: no HJR: no Elevation at 90 degrees: None Carotid Pulse: normal pulsation bilaterally Bruit: None Apex: apical impulse normal  Auscultation Rhythm: atrial fibrillation S1: normal S2: normal Clicks: no Rub: no Murmurs: no murmurs  Gallop: None ABDOMEN Liver enlargement: no Pulsatile aorta: no Ascites: no Bruits: no  EXTREMITIES Clubbing: no Edema: trace to 1+ bilateral pedal edema Pulses: peripheral pulses symmetrical Femoral Bruits: no Amputation: no SKIN Rash: no Cyanosis: no Embolic phemonenon: no Bruising: no NEURO Alert and Oriented to person, place and time: yes Non focal: yes  PSYCH: Pt appears to have normal affect  LABS REVIEWED Last 3 CBC results: No results found for: WBC No results found for: HGB No results found for: HCT  No results found for: PLT  No results found for: CREATININE, BUN, NA, K, CL, CO2  No results found for: HGBA1C  No results found for: HDL No results found for: LDLCALC No results found for: TRIG  No results found for: ALT, AST, GGT, ALKPHOS, BILITOT  No results found for: TSH  Diagnostic Studies Reviewed:  EKG EKG demonstrated atrial fibrillation, rate 82.  Assessment and Plan   72 y.o. male with  ICD-10-CM ICD-9-CM  1. Ataxia etiology unclear. Certainly consistent with possible TIA versus CVA. Patient had no evidence of TIA or CVA on it. He is now on Xarelto and tolerating this well with no further neurologic deficits. R27.0 781.3  2. Atrial fibrillation, unspecified type (CMS-HCC)-2 showed persistent atrial fibrillation with good rate control. Echo showed reduced LV function. Functional study showed no reversible ischemia with an EF of 38%. We will proceed  with cardioversion followed by proceeding to surgery as he appears to be at low risk from a cardiac standpoint. Will stop Xarelto 2 days prior to surgery. Further recommendations after cardioversion. I48.91 427.31      Return in about 1 month (around 08/14/2017).  These notes generated with voice recognition software. I apologize for typographical errors.  Sydnee Levans, MD     Pt seen and examined. No change from above.

## 2017-07-18 NOTE — Anesthesia Preprocedure Evaluation (Signed)
Anesthesia Evaluation  Patient identified by MRN, date of birth, ID band Patient awake    Reviewed: Allergy & Precautions, H&P , NPO status , Patient's Chart, lab work & pertinent test results, reviewed documented beta blocker date and time   History of Anesthesia Complications Negative for: history of anesthetic complications  Airway Mallampati: III  TM Distance: >3 FB Neck ROM: full    Dental  (+) Dental Advidsory Given, Teeth Intact Permanent bridge on the bottom:   Pulmonary neg pulmonary ROS,    Pulmonary exam normal        Cardiovascular Exercise Tolerance: Good (-) hypertension(-) angina+ DVT  (-) CAD, (-) Past MI, (-) Cardiac Stents and (-) CABG + dysrhythmias Atrial Fibrillation (-) Valvular Problems/Murmurs Rhythm:irregular Rate:Abnormal     Neuro/Psych negative neurological ROS  negative psych ROS   GI/Hepatic Neg liver ROS, GERD  ,  Endo/Other  negative endocrine ROS  Renal/GU negative Renal ROS  negative genitourinary   Musculoskeletal   Abdominal   Peds  Hematology negative hematology ROS (+)   Anesthesia Other Findings Past Medical History: No date: Actinic keratosis No date: Allergy No date: Arthritis     Comment:  degenerative No date: Benign fibroma of prostate No date: Carpal tunnel syndrome, right No date: Erectile dysfunction No date: GERD (gastroesophageal reflux disease) No date: Lumbar herniated disc No date: Lumbar stenosis with neurogenic claudication 03/01/2016: Primary localized osteoarthrosis of left shoulder No date: Wears hearing aid in both ears   Reproductive/Obstetrics negative OB ROS                             Anesthesia Physical Anesthesia Plan  ASA: II  Anesthesia Plan: General   Post-op Pain Management:    Induction: Intravenous  PONV Risk Score and Plan: 2 and Propofol infusion  Airway Management Planned: Nasal  Cannula  Additional Equipment:   Intra-op Plan:   Post-operative Plan:   Informed Consent: I have reviewed the patients History and Physical, chart, labs and discussed the procedure including the risks, benefits and alternatives for the proposed anesthesia with the patient or authorized representative who has indicated his/her understanding and acceptance.   Dental Advisory Given  Plan Discussed with: Anesthesiologist, CRNA and Surgeon  Anesthesia Plan Comments:         Anesthesia Quick Evaluation

## 2017-07-18 NOTE — Anesthesia Postprocedure Evaluation (Signed)
Anesthesia Post Note  Patient: Jeffery Davis  Procedure(s) Performed: CARDIOVERSION (N/A )  Patient location during evaluation: Cath Lab Anesthesia Type: General Level of consciousness: awake and alert Pain management: pain level controlled Vital Signs Assessment: post-procedure vital signs reviewed and stable Respiratory status: spontaneous breathing, nonlabored ventilation, respiratory function stable and patient connected to nasal cannula oxygen Cardiovascular status: blood pressure returned to baseline and stable Postop Assessment: no apparent nausea or vomiting Anesthetic complications: no     Last Vitals:  Vitals:   07/18/17 0800 07/18/17 0815  BP: 126/78 (!) 148/91  Pulse: 71 68  Resp: (!) 21 18  Temp:    SpO2: 96% 98%    Last Pain:  Vitals:   07/18/17 0815  TempSrc:   PainSc: 4                  Martha Clan

## 2017-07-18 NOTE — Transfer of Care (Signed)
Immediate Anesthesia Transfer of Care Note  Patient: Jeffery Davis  Procedure(s) Performed: Procedure(s): CARDIOVERSION (N/A)  Patient Location: PACU and Short Stay  Anesthesia Type:General  Level of Consciousness: awake, alert  and oriented  Airway & Oxygen Therapy: Patient Spontanous Breathing and Patient connected to nasal cannula oxygen  Post-op Assessment: Report given to RN and Post -op Vital signs reviewed and stable  Post vital signs: Reviewed and stable  Last Vitals:  Vitals:   07/18/17 0740 07/18/17 0742  BP:  (!) 153/88  Pulse: 72 65  Resp: (!) 23 20  Temp:    SpO2: 016% 01%    Complications: No apparent anesthesia complications

## 2017-07-18 NOTE — Anesthesia Post-op Follow-up Note (Signed)
Anesthesia QCDR form completed.        

## 2017-07-18 NOTE — Anesthesia Procedure Notes (Signed)
Date/Time: 07/18/2017 7:30 AM Performed by: Doreen Salvage, CRNA Pre-anesthesia Checklist: Patient identified, Emergency Drugs available, Suction available and Patient being monitored Patient Re-evaluated:Patient Re-evaluated prior to induction Oxygen Delivery Method: Nasal cannula Induction Type: IV induction Dental Injury: Teeth and Oropharynx as per pre-operative assessment  Comments: Nasal cannula with etCO2 monitoring

## 2017-07-25 DIAGNOSIS — I481 Persistent atrial fibrillation: Secondary | ICD-10-CM | POA: Diagnosis not present

## 2017-07-31 NOTE — Pre-Procedure Instructions (Signed)
Selig Wampole Meding  07/31/2017      [facility] - [place], [place] - [address] [place] [address] Phone: [phone] Fax: [phone]    Your procedure is scheduled on Thursday, May 23rd             Report to [facility] Admitting at 9:45 AM             (posted surgery time 11:45a - 3:34p)   Call this number if you have problems the morning of surgery:  [phone]   Remember:   Do not eat any food or drink any liquids after midnight Wednesday.   Take these medicines the morning of surgery with A SIP OF WATER : Hydrocodone.              Last dose of Xarelto ____________________                        4-5 days prior to surgery, STOP TAKING any Vitamins, Herbal Supplements, Anti-inflammatories.    Do not wear jewelry - no rings or watches.  Do not wear lotions, colognes or deodorant.             Men may shave face and neck.  Do not bring valuables to the hospital.  [facility] is not responsible for any belongings or valuables.  Contacts, dentures or bridgework may not be worn into surgery.  Leave your suitcase in the car.  After surgery it may be brought to your room.  For patients admitted to the hospital, discharge time will be determined by your treatment team.  Please read over the following fact sheets that you were given. Pain Booklet, MRSA Information and Surgical Site Infection Prevention      - Preparing For Surgery  Before surgery, you can play an important role. Because skin is not sterile, your skin needs to be as free of germs as possible. You can reduce the number of germs on your skin by washing with CHG (chlorahexidine gluconate) Soap before surgery.  CHG is an antiseptic cleaner which kills germs and bonds with the skin to continue killing germs even after washing.    Oral Hygiene is also important to reduce your risk of infection.  Remember - BRUSH YOUR TEETH THE MORNING OF SURGERY WITH YOUR  REGULAR TOOTHPASTE  Please do not use if you have an allergy to CHG or antibacterial soaps. If your skin becomes reddened/irritated stop using the CHG.  Do not shave (including legs and underarms) for at least 48 hours prior to first CHG shower. It is OK to shave your face.  Please follow these instructions carefully.   1. Shower the NIGHT BEFORE SURGERY and the MORNING OF SURGERY with CHG.   2. If you chose to wash your hair, wash your hair first as usual with your normal shampoo.  3. After you shampoo, rinse your hair and body thoroughly to remove the shampoo.  4. Use CHG as you would any other liquid soap. You can apply CHG directly to the skin and wash gently with a scrungie or a clean washcloth.   5. Apply the CHG Soap to your body ONLY FROM THE NECK DOWN.  Do not use on open wounds or open sores. Avoid contact with your eyes, ears, mouth and genitals (private parts). Wash Face and genitals (private parts)  with your normal soap.  6. Wash thoroughly, paying special attention  to the area where your surgery will be performed.  7. Thoroughly rinse your body with warm water from the neck down.  8. DO NOT shower/wash with your normal soap after using and rinsing off the CHG Soap.  9. Pat yourself dry with a CLEAN TOWEL.  10. Wear CLEAN PAJAMAS to bed the night before surgery, wear comfortable clothes the morning of surgery  11. Place CLEAN SHEETS on your bed the night of your first shower and DO NOT SLEEP WITH PETS.    Day of Surgery:  Do not apply any deodorants/lotions.  Please wear clean clothes to the hospital/surgery center.   Remember to brush your teeth WITH YOUR REGULAR TOOTHPASTE.

## 2017-08-01 ENCOUNTER — Other Ambulatory Visit: Payer: Self-pay

## 2017-08-01 ENCOUNTER — Encounter (HOSPITAL_COMMUNITY): Payer: Self-pay

## 2017-08-01 ENCOUNTER — Encounter (HOSPITAL_COMMUNITY)
Admission: RE | Admit: 2017-08-01 | Discharge: 2017-08-01 | Disposition: A | Discharge status: Home / Self Care | Payer: PPO | Source: Ambulatory Visit | Attending: Neurosurgery | Admitting: Neurosurgery

## 2017-08-01 DIAGNOSIS — Z7901 Long term (current) use of anticoagulants: Secondary | ICD-10-CM | POA: Diagnosis not present

## 2017-08-01 DIAGNOSIS — K219 Gastro-esophageal reflux disease without esophagitis: Secondary | ICD-10-CM | POA: Diagnosis present

## 2017-08-01 DIAGNOSIS — Z79899 Other long term (current) drug therapy: Secondary | ICD-10-CM | POA: Diagnosis not present

## 2017-08-01 DIAGNOSIS — M4807 Spinal stenosis, lumbosacral region: Secondary | ICD-10-CM | POA: Diagnosis present

## 2017-08-01 DIAGNOSIS — M4327 Fusion of spine, lumbosacral region: Secondary | ICD-10-CM | POA: Diagnosis not present

## 2017-08-01 DIAGNOSIS — M9943 Connective tissue stenosis of neural canal of lumbar region: Secondary | ICD-10-CM | POA: Diagnosis not present

## 2017-08-01 DIAGNOSIS — M5137 Other intervertebral disc degeneration, lumbosacral region: Secondary | ICD-10-CM | POA: Diagnosis not present

## 2017-08-01 DIAGNOSIS — M48061 Spinal stenosis, lumbar region without neurogenic claudication: Secondary | ICD-10-CM | POA: Diagnosis not present

## 2017-08-01 DIAGNOSIS — M47816 Spondylosis without myelopathy or radiculopathy, lumbar region: Secondary | ICD-10-CM | POA: Diagnosis not present

## 2017-08-01 DIAGNOSIS — Z9849 Cataract extraction status, unspecified eye: Secondary | ICD-10-CM | POA: Diagnosis not present

## 2017-08-01 DIAGNOSIS — M4727 Other spondylosis with radiculopathy, lumbosacral region: Secondary | ICD-10-CM | POA: Diagnosis present

## 2017-08-01 DIAGNOSIS — Z981 Arthrodesis status: Secondary | ICD-10-CM | POA: Diagnosis not present

## 2017-08-01 DIAGNOSIS — R3911 Hesitancy of micturition: Secondary | ICD-10-CM | POA: Diagnosis not present

## 2017-08-01 DIAGNOSIS — I10 Essential (primary) hypertension: Secondary | ICD-10-CM | POA: Diagnosis present

## 2017-08-01 DIAGNOSIS — Z96612 Presence of left artificial shoulder joint: Secondary | ICD-10-CM | POA: Diagnosis present

## 2017-08-01 DIAGNOSIS — Z8249 Family history of ischemic heart disease and other diseases of the circulatory system: Secondary | ICD-10-CM | POA: Diagnosis not present

## 2017-08-01 HISTORY — DX: Essential (primary) hypertension: I10

## 2017-08-01 HISTORY — DX: Spinal stenosis, lumbosacral region: M48.07

## 2017-08-01 HISTORY — DX: Other biomechanical lesions of lumbar region: M99.83

## 2017-08-01 HISTORY — DX: Heart disease, unspecified: I51.9

## 2017-08-01 HISTORY — DX: Unspecified atrial fibrillation: I48.91

## 2017-08-01 LAB — CBC
HCT: 37.5 % — ABNORMAL LOW (ref 39.0–52.0)
Hemoglobin: 12.1 g/dL — ABNORMAL LOW (ref 13.0–17.0)
MCH: 27.9 pg (ref 26.0–34.0)
MCHC: 32.3 g/dL (ref 30.0–36.0)
MCV: 86.6 fL (ref 78.0–100.0)
Platelets: 252 10*3/uL (ref 150–400)
RBC: 4.33 MIL/uL (ref 4.22–5.81)
RDW: 16.8 % — ABNORMAL HIGH (ref 11.5–15.5)
WBC: 5.7 10*3/uL (ref 4.0–10.5)

## 2017-08-01 LAB — BASIC METABOLIC PANEL
Anion gap: 8 (ref 5–15)
BUN: 16 mg/dL (ref 6–20)
CO2: 24 mmol/L (ref 22–32)
Calcium: 8.9 mg/dL (ref 8.9–10.3)
Chloride: 108 mmol/L (ref 101–111)
Creatinine, Ser: 0.92 mg/dL (ref 0.61–1.24)
GFR calc Af Amer: >60 mL/min (ref >60)
GFR calc non Af Amer: >60 mL/min (ref >60)
Glucose, Bld: 94 mg/dL (ref 65–99)
Potassium: 3.9 mmol/L (ref 3.5–5.1)
Sodium: 140 mmol/L (ref 135–145)

## 2017-08-01 LAB — SURGICAL PCR SCREEN
MRSA, PCR: NEGATIVE
Staphylococcus aureus: NEGATIVE

## 2017-08-01 LAB — TYPE AND SCREEN
ABO/RH(D): A POS
Antibody Screen: NEGATIVE

## 2017-08-01 LAB — PROTIME-INR
INR: 1.09
Prothrombin Time: 14 seconds (ref 11.4–15.2)

## 2017-08-01 NOTE — Progress Notes (Signed)
PCP - Dr. Thurston Hole  [facility]  Chest x-ray - Denies  EKG - 07/25/17- In Physical Chart  Stress Test - 07/06/17 (CE)  ECHO - 06/20/17 (CE)  Cardiac Cath - 2012- Negative, per pt   Sleep Study - Yes- Negative CPAP - None  LABS- 08/01/17: CBC, BMP, PT, T/S  ASA- Denies Xarelto- LD- 5/19  Anesthesia- Yes- cardiac history  Pt denies having chest pain, sob, or fever at this time. All instructions explained to the pt, with a verbal understanding of the material. Pt agrees to go over the instructions while at home for a better understanding. The opportunity to ask questions was provided.

## 2017-08-02 ENCOUNTER — Other Ambulatory Visit: Payer: Self-pay | Admitting: Neurosurgery

## 2017-08-02 ENCOUNTER — Encounter (HOSPITAL_COMMUNITY): Payer: Self-pay

## 2017-08-02 NOTE — Progress Notes (Signed)
Anesthesia Chart Review:  Case:  [id number] Date/Time:  08/03/17 1130   Procedure:  POSTERIOR LUMBAR INTERBODY FUSION, INTERBODY PROSTHESIS, POSTERIOR INSTRUMENTATION AND FUSION  LUMBAR 5- SACRAL 1 (N/A ) - POSTERIOR LUMBAR INTERBODY FUSION, INTERBODY PROSTHESIS, POSTERIOR INSTRUMENTATION AND FUSION  LUMBAR 5- SACRAL 1   Anesthesia type:  General   Pre-op diagnosis:  NEURAL FORAMINAL STENOSIS OF LUMBOSACRAL SPINE   Location:  [place] OR ROOM 19 / [facility]   Surgeon:  Newman Pies, MD      DISCUSSION: Patient is a 72 year old male scheduled for the above procedure. It appears surgery was initially scheduled for 06/05/17 but was postponed due to need for further cardiac work-up. It appears he developed new onset afib with LV dysfunction. Other history includes never smoker and HTN. Patient has since had a stress, echo, and successful cardioversion (see below).  On 07/18/17, cardiologist Dr. Bartholome Bill wrote, "Pt underwent cardioversion to nsr [0/4/54] with no complications. Workup included echo showing mild to moderately reduced lv function with ef of 35-40% globally. He underwent lexiscan myoview with also showed reduced lv function with no ischemia.  Would proceed with surgery with no further cardiac workup at present. Pt will be on xarelto until 3 days prior to surgery. He is optimized for surgery."  Last Xarelto 07/30/17. If no acute changes then I anticipate that he can proceed as planned.  VS: BP (!) 168/88   Pulse 66   Temp 36.5 C   Resp 18   Ht 5\' 8"  (1.727 m)   Wt 173 lb 3.2 oz (78.6 kg)   SpO2 96%   BMI 26.33 kg/m   PROVIDERS: Jerrol Banana., MD is PCP  Patient Care Team: Idelle Leech, [place] as Consulting Physician (Optometry) Meade Maw, MD as Consulting Physician (Neurosurgery) [place], MD as Consulting Physician (Radiology) Ralene Bathe, MD as Consulting Physician (Dermatology) Bartholome Bill, MD is Cardiologist. Last visit 07/25/17 and was felt to be  optimized for surgery. (See [place].)  LABS: Labs reviewed: Acceptable for surgery. (all labs ordered are listed, but only abnormal results are displayed)  Labs Reviewed  CBC - Abnormal; Notable for the following components:      Result Value   Hemoglobin 12.1 (*)    HCT 37.5 (*)    RDW 16.8 (*)    All other components within normal limits  SURGICAL PCR SCREEN  BASIC METABOLIC PANEL  PROTIME-INR  TYPE AND SCREEN    EKG: 07/18/17 (post DCCV): SR at 66 bpm, PACs. First degree AV block.   CV: Nuclear stress test 07/06/17 ([place]): Result Impression:  EF 38%.Fibrillation with no dysrhythmia with regadenoson infusion. Mildly dilated.No obvious ischemia.Clinical correlation indicated.  Echo 06/20/17 ([place]): INTERPRETATION MODERATE LV SYSTOLIC DYSFUNCTION (Hypocontracile: anterior, lateral, septal, apical, inferior, posterior). WITH MILD LVH NORMAL RIGHT VENTRICULAR SYSTOLIC FUNCTION MILD VALVULAR REGURGITATION (Mild MR. Mild TR. Mild PR.) NO VALVULAR STENOSIS Morphology: MILDLY THICKENED LVH: MILD LVH Mitral: MILD MR Tricuspid: MILD TR Closest EF: 35-40% (Estimated) Contraction: MILD - MOD GLOBAL DECREASE  48 hour Holter Monitor 06/21/17: Summary: 1.Atrial fibrillation flutter with variable ventricular response.Frequent PVCs but no sustained ventricular runs.No pauses.  He reported a "negative" cath in 2012, but I don't have that report. (History lists cath was from 2005.)  Past Medical History:  Diagnosis Date  . Actinic keratosis   . Allergy   . Arthritis    degenerative  . Benign fibroma of prostate   . Carpal  tunnel syndrome, right   . Erectile dysfunction   . GERD (gastroesophageal reflux disease)   . Hypertension   . Lumbar herniated disc   . Lumbar stenosis with neurogenic claudication   . Neural foraminal stenosis of lumbosacral spine   . Primary localized osteoarthrosis of left shoulder 03/01/2016  .  Wears hearing aid in both ears     Past Surgical History:  Procedure Laterality Date  . CARDIAC CATHETERIZATION  2005   [place] N/A 07/18/2017   Procedure: CARDIOVERSION;  Surgeon: Teodoro Spray, MD;  Location: [facility] ORS;  Service: Cardiovascular;  Laterality: N/A;  . CATARACT EXTRACTION  06/2002  . COLONOSCOPY    . COLONOSCOPY WITH PROPOFOL N/A 05/03/2017   Procedure: COLONOSCOPY WITH PROPOFOL;  Surgeon: Manya Silvas, MD;  Location: [facility] ENDOSCOPY;  Service: Endoscopy;  Laterality: N/A;  . ESOPHAGOGASTRODUODENOSCOPY (EGD) WITH PROPOFOL N/A 01/08/2016   Procedure: ESOPHAGOGASTRODUODENOSCOPY (EGD) WITH PROPOFOL;  Surgeon: Manya Silvas, MD;  Location: [facility] END[facility]COPY;  Service: Endoscopy;  Laterality: N/A;  . KNEE ARTHROSCOPY Right   . LUMBAR LAMINECTOMY/DECOMPRESSION MICRODISCECTOMY N/A 01/09/2017   Procedure: LAMINECTOMY AND FORAMINOTOMY LUMBAR THREE-FOUR LUMBAR FOUR-FIVE  LUMBAR FIVE SACRAL ONE;  Surgeon: Newman Pies, MD;  Location: [place];  Service: Neurosurgery;  Laterality: N/A;  . SHOULDER SURGERY Left 2010  . TOTAL SHOULDER ARTHROPLASTY Left 03/01/2016   Procedure: REVERSE TOTAL SHOULDER ARTHROPLASTY;  Surgeon: Marchia Bond, MD;  Location: [place];  Service: Orthopedics;  Laterality: Left;    MEDICATIONS: . acetaminophen (TYLENOL) 500 MG tablet  . atorvastatin (LIPITOR) 10 MG tablet  . calcium carbonate (TUMS - DOSED IN MG ELEMENTAL CALCIUM) 500 MG chewable tablet  . Cholecalciferol (VITAMIN D3) 5000 units TABS  . Coenzyme Q10 (CO Q 10) 100 MG CAPS  . diphenhydramine-acetaminophen (TYLENOL PM) 25-500 MG TABS tablet  . HYDROcodone-acetaminophen (NORCO/VICODIN) 5-325 MG tablet  . ketoconazole (NIZORAL) 2 % cream  . lisinopril (PRINIVIL,ZESTRIL) 5 MG tablet  . metoprolol succinate (TOPROL-XL) 25 MG 24 hr tablet  . Misc Natural Products (GLUCOSAMINE CHONDROITIN TRIPLE) TABS  . omeprazole (PRILOSEC) 20 MG capsule  . rivaroxaban (XARELTO) 20 MG  TABS tablet  . sildenafil (REVATIO) 20 MG tablet  . Tetrahydroz-Polyvinyl Al-Povid (MURINE TEARS PLUS OP)   No current facility-administered medications for this encounter.    George Hugh [facility] Short Stay Center/Anesthesiology Phone [phone] 08/02/2017 11:11 AM

## 2017-08-03 ENCOUNTER — Inpatient Hospital Stay (HOSPITAL_COMMUNITY)
Admission: RE | Admit: 2017-08-03 | Discharge: 2017-08-04 | DRG: 455 | Disposition: A | Discharge status: Home / Self Care | Payer: PPO | Attending: Neurosurgery | Admitting: Neurosurgery

## 2017-08-03 ENCOUNTER — Inpatient Hospital Stay (HOSPITAL_COMMUNITY)
Admission: RE | Disposition: A | Discharge status: Home / Self Care | Payer: Self-pay | Source: Home / Self Care | Attending: Neurosurgery

## 2017-08-03 ENCOUNTER — Inpatient Hospital Stay (HOSPITAL_COMMUNITY): Payer: PPO

## 2017-08-03 ENCOUNTER — Inpatient Hospital Stay (HOSPITAL_COMMUNITY): Payer: PPO | Admitting: Vascular Surgery

## 2017-08-03 ENCOUNTER — Encounter (HOSPITAL_COMMUNITY): Payer: Self-pay | Admitting: *Deleted

## 2017-08-03 ENCOUNTER — Other Ambulatory Visit: Payer: Self-pay

## 2017-08-03 DIAGNOSIS — M4327 Fusion of spine, lumbosacral region: Secondary | ICD-10-CM | POA: Diagnosis not present

## 2017-08-03 DIAGNOSIS — M4807 Spinal stenosis, lumbosacral region: Secondary | ICD-10-CM | POA: Diagnosis present

## 2017-08-03 DIAGNOSIS — Z9849 Cataract extraction status, unspecified eye: Secondary | ICD-10-CM

## 2017-08-03 DIAGNOSIS — R3911 Hesitancy of micturition: Secondary | ICD-10-CM | POA: Diagnosis not present

## 2017-08-03 DIAGNOSIS — M4727 Other spondylosis with radiculopathy, lumbosacral region: Secondary | ICD-10-CM | POA: Diagnosis present

## 2017-08-03 DIAGNOSIS — Z8249 Family history of ischemic heart disease and other diseases of the circulatory system: Secondary | ICD-10-CM | POA: Diagnosis not present

## 2017-08-03 DIAGNOSIS — Z79899 Other long term (current) drug therapy: Secondary | ICD-10-CM

## 2017-08-03 DIAGNOSIS — M9983 Other biomechanical lesions of lumbar region: Secondary | ICD-10-CM

## 2017-08-03 DIAGNOSIS — K219 Gastro-esophageal reflux disease without esophagitis: Secondary | ICD-10-CM | POA: Diagnosis present

## 2017-08-03 DIAGNOSIS — Z419 Encounter for procedure for purposes other than remedying health state, unspecified: Secondary | ICD-10-CM

## 2017-08-03 DIAGNOSIS — Z96612 Presence of left artificial shoulder joint: Secondary | ICD-10-CM | POA: Diagnosis present

## 2017-08-03 DIAGNOSIS — Z7901 Long term (current) use of anticoagulants: Secondary | ICD-10-CM

## 2017-08-03 DIAGNOSIS — I10 Essential (primary) hypertension: Secondary | ICD-10-CM | POA: Diagnosis present

## 2017-08-03 DIAGNOSIS — Z981 Arthrodesis status: Secondary | ICD-10-CM | POA: Diagnosis not present

## 2017-08-03 SURGERY — POSTERIOR LUMBAR FUSION 1 LEVEL
Anesthesia: General | Site: Spine Lumbar

## 2017-08-03 MED ORDER — LIDOCAINE 2% (20 MG/ML) 5 ML SYRINGE
INTRAMUSCULAR | Status: DC | PRN
  Administered 2017-08-03: 60 mg via INTRAVENOUS

## 2017-08-03 MED ORDER — METOPROLOL SUCCINATE ER 25 MG PO TB24
25.0000 mg | ORAL_TABLET | Freq: Every evening | ORAL | Status: DC
  Administered 2017-08-03: 25 mg via ORAL
  Filled 2017-08-03: qty 1

## 2017-08-03 MED ORDER — MEPERIDINE HCL 50 MG/ML IJ SOLN: 6.2500 mg | INTRAMUSCULAR | Status: DC | PRN

## 2017-08-03 MED ORDER — CEFAZOLIN SODIUM-DEXTROSE 2-4 GM/100ML-% IV SOLN
2.0000 g | Freq: Three times a day (TID) | INTRAVENOUS | Status: AC
  Administered 2017-08-03 – 2017-08-04 (×2): 2 g via INTRAVENOUS
  Filled 2017-08-03 (×2): qty 100

## 2017-08-03 MED ORDER — BUPIVACAINE-EPINEPHRINE (PF) 0.5% -1:200000 IJ SOLN
INTRAMUSCULAR | Status: DC | PRN
  Administered 2017-08-03: 10 mL

## 2017-08-03 MED ORDER — PROPOFOL 10 MG/ML IV BOLUS
INTRAVENOUS | Status: DC | PRN
  Administered 2017-08-03: 150 mg via INTRAVENOUS

## 2017-08-03 MED ORDER — BACITRACIN ZINC 500 UNIT/GM EX OINT
TOPICAL_OINTMENT | CUTANEOUS | Status: DC | PRN
  Administered 2017-08-03: 1 via TOPICAL

## 2017-08-03 MED ORDER — ROCURONIUM BROMIDE 10 MG/ML (PF) SYRINGE
PREFILLED_SYRINGE | INTRAVENOUS | Status: DC | PRN
  Administered 2017-08-03 (×2): 10 mg via INTRAVENOUS
  Administered 2017-08-03: 20 mg via INTRAVENOUS
  Administered 2017-08-03: 40 mg via INTRAVENOUS
  Administered 2017-08-03 (×2): 10 mg via INTRAVENOUS

## 2017-08-03 MED ORDER — PHENYLEPHRINE HCL 10 MG/ML IJ SOLN
INTRAVENOUS | Status: DC | PRN
  Administered 2017-08-03: 15 ug/min via INTRAVENOUS

## 2017-08-03 MED ORDER — CO Q 10 100 MG PO CAPS: 100.0000 mg | ORAL_CAPSULE | Freq: Two times a day (BID) | ORAL | Status: DC

## 2017-08-03 MED ORDER — CEFAZOLIN SODIUM-DEXTROSE 2-4 GM/100ML-% IV SOLN
2.0000 g | INTRAVENOUS | Status: AC
  Administered 2017-08-03: 2 g via INTRAVENOUS

## 2017-08-03 MED ORDER — ACETAMINOPHEN 10 MG/ML IV SOLN
INTRAVENOUS | Status: DC | PRN
  Administered 2017-08-03: 1000 mg via INTRAVENOUS

## 2017-08-03 MED ORDER — ARTIFICIAL TEARS OPHTHALMIC OINT
TOPICAL_OINTMENT | OPHTHALMIC | Status: AC
  Filled 2017-08-03: qty 3.5

## 2017-08-03 MED ORDER — ONDANSETRON HCL 4 MG PO TABS: 4.0000 mg | ORAL_TABLET | Freq: Four times a day (QID) | ORAL | Status: DC | PRN

## 2017-08-03 MED ORDER — DEXAMETHASONE SODIUM PHOSPHATE 10 MG/ML IJ SOLN
INTRAMUSCULAR | Status: DC | PRN
  Administered 2017-08-03: 10 mg via INTRAVENOUS

## 2017-08-03 MED ORDER — VANCOMYCIN HCL 1 G IV SOLR
INTRAVENOUS | Status: DC | PRN
  Administered 2017-08-03: 1000 mg via TOPICAL

## 2017-08-03 MED ORDER — SODIUM CHLORIDE 0.9 % IV SOLN
INTRAVENOUS | Status: DC | PRN
  Administered 2017-08-03: 500 mL

## 2017-08-03 MED ORDER — PROPOFOL 10 MG/ML IV BOLUS
INTRAVENOUS | Status: AC
  Filled 2017-08-03: qty 20

## 2017-08-03 MED ORDER — THROMBIN (RECOMBINANT) 5000 UNITS EX SOLR
OROMUCOSAL | Status: DC | PRN
  Administered 2017-08-03: 5 mL via TOPICAL

## 2017-08-03 MED ORDER — BISACODYL 10 MG RE SUPP: 10.0000 mg | Freq: Every day | RECTAL | Status: DC | PRN

## 2017-08-03 MED ORDER — SUGAMMADEX SODIUM 200 MG/2ML IV SOLN
INTRAVENOUS | Status: DC | PRN
  Administered 2017-08-03: 200 mg via INTRAVENOUS

## 2017-08-03 MED ORDER — FENTANYL CITRATE (PF) 250 MCG/5ML IJ SOLN
INTRAMUSCULAR | Status: DC | PRN
  Administered 2017-08-03 (×5): 50 ug via INTRAVENOUS

## 2017-08-03 MED ORDER — ONDANSETRON HCL 4 MG/2ML IJ SOLN
INTRAMUSCULAR | Status: DC | PRN
  Administered 2017-08-03: 4 mg via INTRAVENOUS

## 2017-08-03 MED ORDER — PANTOPRAZOLE SODIUM 40 MG PO TBEC
40.0000 mg | DELAYED_RELEASE_TABLET | Freq: Every day | ORAL | Status: DC
  Administered 2017-08-03 – 2017-08-04 (×2): 40 mg via ORAL
  Filled 2017-08-03 (×2): qty 1

## 2017-08-03 MED ORDER — ACETAMINOPHEN 10 MG/ML IV SOLN
INTRAVENOUS | Status: AC
  Filled 2017-08-03: qty 100

## 2017-08-03 MED ORDER — MENTHOL 3 MG MT LOZG: 1.0000 | LOZENGE | OROMUCOSAL | Status: DC | PRN

## 2017-08-03 MED ORDER — BUPIVACAINE LIPOSOME 1.3 % IJ SUSP
INTRAMUSCULAR | Status: DC | PRN
  Administered 2017-08-03: 20 mL

## 2017-08-03 MED ORDER — ACETAMINOPHEN 500 MG PO TABS
1000.0000 mg | ORAL_TABLET | Freq: Four times a day (QID) | ORAL | Status: AC
  Administered 2017-08-03 – 2017-08-04 (×4): 1000 mg via ORAL
  Filled 2017-08-03 (×4): qty 2

## 2017-08-03 MED ORDER — ONDANSETRON HCL 4 MG/2ML IJ SOLN
INTRAMUSCULAR | Status: AC
  Filled 2017-08-03: qty 2

## 2017-08-03 MED ORDER — LACTATED RINGERS IV SOLN: INTRAVENOUS | Status: DC

## 2017-08-03 MED ORDER — BACITRACIN ZINC 500 UNIT/GM EX OINT
TOPICAL_OINTMENT | CUTANEOUS | Status: AC
  Filled 2017-08-03: qty 28.35

## 2017-08-03 MED ORDER — BUPIVACAINE LIPOSOME 1.3 % IJ SUSP
20.0000 mL | INTRAMUSCULAR | Status: DC
Start: 2017-08-03 — End: 2017-08-03
  Filled 2017-08-03: qty 20

## 2017-08-03 MED ORDER — 0.9 % SODIUM CHLORIDE (POUR BTL) OPTIME
TOPICAL | Status: DC | PRN
  Administered 2017-08-03: 1000 mL

## 2017-08-03 MED ORDER — ONDANSETRON HCL 4 MG/2ML IJ SOLN: 4.0000 mg | Freq: Four times a day (QID) | INTRAMUSCULAR | Status: DC | PRN

## 2017-08-03 MED ORDER — THROMBIN 5000 UNITS EX SOLR
CUTANEOUS | Status: AC
  Filled 2017-08-03: qty 5000

## 2017-08-03 MED ORDER — DIPHENHYDRAMINE HCL 50 MG/ML IJ SOLN
INTRAMUSCULAR | Status: AC
  Filled 2017-08-03: qty 1

## 2017-08-03 MED ORDER — SODIUM CHLORIDE 0.9 % IV SOLN: 250.0000 mL | INTRAVENOUS | Status: DC

## 2017-08-03 MED ORDER — SODIUM CHLORIDE 0.9% FLUSH: 3.0000 mL | Freq: Two times a day (BID) | INTRAVENOUS | Status: DC

## 2017-08-03 MED ORDER — POLYVINYL ALCOHOL 1.4 % OP SOLN
1.0000 [drp] | OPHTHALMIC | Status: DC | PRN
  Filled 2017-08-03: qty 15

## 2017-08-03 MED ORDER — SODIUM CHLORIDE 0.9% FLUSH: 3.0000 mL | INTRAVENOUS | Status: DC | PRN

## 2017-08-03 MED ORDER — LACTATED RINGERS IV SOLN
INTRAVENOUS | Status: DC
  Administered 2017-08-03 (×2): via INTRAVENOUS

## 2017-08-03 MED ORDER — TETRAHYDROZ-POLYVINYL AL-POVID 0.05-0.5-0.6 % OP SOLN: Freq: Every day | OPHTHALMIC | Status: DC | PRN

## 2017-08-03 MED ORDER — DEXAMETHASONE SODIUM PHOSPHATE 10 MG/ML IJ SOLN
INTRAMUSCULAR | Status: AC
  Filled 2017-08-03: qty 1

## 2017-08-03 MED ORDER — HYDROMORPHONE HCL 2 MG/ML IJ SOLN
INTRAMUSCULAR | Status: AC
  Filled 2017-08-03: qty 1

## 2017-08-03 MED ORDER — CYCLOBENZAPRINE HCL 10 MG PO TABS: 10.0000 mg | ORAL_TABLET | Freq: Three times a day (TID) | ORAL | Status: DC | PRN

## 2017-08-03 MED ORDER — OXYCODONE HCL 5 MG PO TABS
10.0000 mg | ORAL_TABLET | ORAL | Status: DC | PRN
  Administered 2017-08-03 – 2017-08-04 (×3): 10 mg via ORAL
  Filled 2017-08-03 (×3): qty 2

## 2017-08-03 MED ORDER — FENTANYL CITRATE (PF) 250 MCG/5ML IJ SOLN
INTRAMUSCULAR | Status: AC
  Filled 2017-08-03: qty 5

## 2017-08-03 MED ORDER — PHENYLEPHRINE 40 MCG/ML (10ML) SYRINGE FOR IV PUSH (FOR BLOOD PRESSURE SUPPORT)
PREFILLED_SYRINGE | INTRAVENOUS | Status: AC
  Filled 2017-08-03: qty 10

## 2017-08-03 MED ORDER — CEFAZOLIN SODIUM-DEXTROSE 2-4 GM/100ML-% IV SOLN
INTRAVENOUS | Status: AC
  Filled 2017-08-03: qty 100

## 2017-08-03 MED ORDER — CHLORHEXIDINE GLUCONATE CLOTH 2 % EX PADS: 6.0000 | MEDICATED_PAD | Freq: Once | CUTANEOUS | Status: DC

## 2017-08-03 MED ORDER — LISINOPRIL 10 MG PO TABS
5.0000 mg | ORAL_TABLET | Freq: Every day | ORAL | Status: DC
  Administered 2017-08-03 – 2017-08-04 (×2): 5 mg via ORAL
  Filled 2017-08-03 (×2): qty 1

## 2017-08-03 MED ORDER — PHENOL 1.4 % MT LIQD: 1.0000 | OROMUCOSAL | Status: DC | PRN

## 2017-08-03 MED ORDER — BUPIVACAINE-EPINEPHRINE (PF) 0.5% -1:200000 IJ SOLN
INTRAMUSCULAR | Status: AC
  Filled 2017-08-03: qty 30

## 2017-08-03 MED ORDER — HYDROMORPHONE HCL 2 MG/ML IJ SOLN
0.3000 mg | INTRAMUSCULAR | Status: DC | PRN
  Administered 2017-08-03 (×2): 0.5 mg via INTRAVENOUS

## 2017-08-03 MED ORDER — ROCURONIUM BROMIDE 50 MG/5ML IV SOLN
INTRAVENOUS | Status: AC
  Filled 2017-08-03: qty 2

## 2017-08-03 MED ORDER — SUGAMMADEX SODIUM 200 MG/2ML IV SOLN
INTRAVENOUS | Status: AC
  Filled 2017-08-03: qty 2

## 2017-08-03 MED ORDER — ACETAMINOPHEN 650 MG RE SUPP: 650.0000 mg | RECTAL | Status: DC | PRN

## 2017-08-03 MED ORDER — VANCOMYCIN HCL 1000 MG IV SOLR
INTRAVENOUS | Status: AC
  Filled 2017-08-03: qty 1000

## 2017-08-03 MED ORDER — VITAMIN D 1000 UNITS PO TABS
5000.0000 [IU] | ORAL_TABLET | Freq: Every day | ORAL | Status: DC
  Administered 2017-08-03: 5000 [IU] via ORAL
  Filled 2017-08-03 (×2): qty 5

## 2017-08-03 MED ORDER — MORPHINE SULFATE (PF) 4 MG/ML IV SOLN: 4.0000 mg | INTRAVENOUS | Status: DC | PRN

## 2017-08-03 MED ORDER — MIDAZOLAM HCL 2 MG/2ML IJ SOLN
INTRAMUSCULAR | Status: DC | PRN
  Administered 2017-08-03: 2 mg via INTRAVENOUS

## 2017-08-03 MED ORDER — ACETAMINOPHEN 325 MG PO TABS: 650.0000 mg | ORAL_TABLET | ORAL | Status: DC | PRN

## 2017-08-03 MED ORDER — ATORVASTATIN CALCIUM 20 MG PO TABS
10.0000 mg | ORAL_TABLET | Freq: Every day | ORAL | Status: DC
  Administered 2017-08-03 – 2017-08-04 (×2): 10 mg via ORAL
  Filled 2017-08-03 (×2): qty 1

## 2017-08-03 MED ORDER — OXYCODONE HCL 5 MG PO TABS: 5.0000 mg | ORAL_TABLET | ORAL | Status: DC | PRN

## 2017-08-03 MED ORDER — GLYCOPYRROLATE 0.2 MG/ML IJ SOLN
INTRAMUSCULAR | Status: DC | PRN
  Administered 2017-08-03 (×2): 0.2 mg via INTRAVENOUS

## 2017-08-03 MED ORDER — PROMETHAZINE HCL 25 MG/ML IJ SOLN: 6.2500 mg | INTRAMUSCULAR | Status: DC | PRN

## 2017-08-03 MED ORDER — DOCUSATE SODIUM 100 MG PO CAPS
100.0000 mg | ORAL_CAPSULE | Freq: Two times a day (BID) | ORAL | Status: DC
  Administered 2017-08-03 – 2017-08-04 (×2): 100 mg via ORAL
  Filled 2017-08-03 (×2): qty 1

## 2017-08-03 MED ORDER — MIDAZOLAM HCL 2 MG/2ML IJ SOLN
INTRAMUSCULAR | Status: AC
Start: 2017-08-03 — End: ?
  Filled 2017-08-03: qty 2

## 2017-08-03 MED ORDER — PHENYLEPHRINE HCL 10 MG/ML IJ SOLN
INTRAMUSCULAR | Status: DC | PRN
  Administered 2017-08-03: 40 ug via INTRAVENOUS

## 2017-08-03 MED ORDER — LIDOCAINE 2% (20 MG/ML) 5 ML SYRINGE
INTRAMUSCULAR | Status: AC
  Filled 2017-08-03: qty 5

## 2017-08-03 SURGICAL SUPPLY — 72 items
BAG DECANTER FOR FLEXI CONT (MISCELLANEOUS) ×2 IMPLANT
BENZOIN TINCTURE PRP APPL 2/3 (GAUZE/BANDAGES/DRESSINGS) ×2 IMPLANT
BLADE CLIPPER SURG (BLADE) ×2 IMPLANT
BUR MATCHSTICK NEURO 3.0 LAGG (BURR) ×2 IMPLANT
BUR PRECISION FLUTE 6.0 (BURR) ×2 IMPLANT
CAGE ALTERA 10X31X9-13 15D (Cage) ×2 IMPLANT
CANISTER SUCT 3000ML PPV (MISCELLANEOUS) ×2 IMPLANT
CAP REVERE LOCKING (Cap) ×8 IMPLANT
CARTRIDGE OIL MAESTRO DRILL (MISCELLANEOUS) ×1 IMPLANT
CONT SPEC 4OZ CLIKSEAL STRL BL (MISCELLANEOUS) ×2 IMPLANT
COVER BACK TABLE 60X90IN (DRAPES) ×2 IMPLANT
DECANTER SPIKE VIAL GLASS SM (MISCELLANEOUS) IMPLANT
DIFFUSER DRILL AIR PNEUMATIC (MISCELLANEOUS) ×2 IMPLANT
DRAPE C-ARM 42X72 X-RAY (DRAPES) ×6 IMPLANT
DRAPE HALF SHEET 40X57 (DRAPES) ×4 IMPLANT
DRAPE LAPAROTOMY 100X72X124 (DRAPES) ×2 IMPLANT
DRAPE SURG 17X23 STRL (DRAPES) ×8 IMPLANT
DRSG OPSITE POSTOP 4X8 (GAUZE/BANDAGES/DRESSINGS) ×2 IMPLANT
ELECT BLADE 4.0 EZ CLEAN MEGAD (MISCELLANEOUS) ×2
ELECT REM PT RETURN 9FT ADLT (ELECTROSURGICAL) ×2
ELECTRODE BLDE 4.0 EZ CLN MEGD (MISCELLANEOUS) ×1 IMPLANT
ELECTRODE REM PT RTRN 9FT ADLT (ELECTROSURGICAL) ×1 IMPLANT
EVACUATOR 1/8 PVC DRAIN (DRAIN) IMPLANT
GAUZE SPONGE 4X4 12PLY STRL (GAUZE/BANDAGES/DRESSINGS) IMPLANT
GAUZE SPONGE 4X4 16PLY XRAY LF (GAUZE/BANDAGES/DRESSINGS) IMPLANT
GLOVE BIO SURGEON STRL SZ8 (GLOVE) ×4 IMPLANT
GLOVE BIO SURGEON STRL SZ8.5 (GLOVE) ×4 IMPLANT
GLOVE BIOGEL PI IND STRL 6.5 (GLOVE) ×3 IMPLANT
GLOVE BIOGEL PI IND STRL 7.5 (GLOVE) ×1 IMPLANT
GLOVE BIOGEL PI IND STRL 8 (GLOVE) ×1 IMPLANT
GLOVE BIOGEL PI INDICATOR 6.5 (GLOVE) ×3
GLOVE BIOGEL PI INDICATOR 7.5 (GLOVE) ×1
GLOVE BIOGEL PI INDICATOR 8 (GLOVE) ×1
GLOVE ECLIPSE 7.5 STRL STRAW (GLOVE) ×2 IMPLANT
GLOVE EXAM NITRILE LRG STRL (GLOVE) IMPLANT
GLOVE EXAM NITRILE XL STR (GLOVE) IMPLANT
GLOVE EXAM NITRILE XS STR PU (GLOVE) IMPLANT
GLOVE SURG SS PI 6.0 STRL IVOR (GLOVE) ×2 IMPLANT
GLOVE SURG SS PI 6.5 STRL IVOR (GLOVE) ×14 IMPLANT
GLOVE SURG SS PI 7.0 STRL IVOR (GLOVE) ×2 IMPLANT
GOWN STRL REUS W/ TWL LRG LVL3 (GOWN DISPOSABLE) ×3 IMPLANT
GOWN STRL REUS W/ TWL XL LVL3 (GOWN DISPOSABLE) ×3 IMPLANT
GOWN STRL REUS W/TWL 2XL LVL3 (GOWN DISPOSABLE) IMPLANT
GOWN STRL REUS W/TWL LRG LVL3 (GOWN DISPOSABLE) ×3
GOWN STRL REUS W/TWL XL LVL3 (GOWN DISPOSABLE) ×3
HEMOSTAT POWDER KIT SURGIFOAM (HEMOSTASIS) ×2 IMPLANT
KIT BASIN OR (CUSTOM PROCEDURE TRAY) ×2 IMPLANT
KIT TURNOVER KIT B (KITS) ×2 IMPLANT
MILL MEDIUM DISP (BLADE) IMPLANT
NEEDLE HYPO 21X1.5 SAFETY (NEEDLE) ×2 IMPLANT
NEEDLE HYPO 22GX1.5 SAFETY (NEEDLE) ×2 IMPLANT
NS IRRIG 1000ML POUR BTL (IV SOLUTION) ×2 IMPLANT
OIL CARTRIDGE MAESTRO DRILL (MISCELLANEOUS) ×2
PACK LAMINECTOMY NEURO (CUSTOM PROCEDURE TRAY) ×2 IMPLANT
PAD ARMBOARD 7.5X6 YLW CONV (MISCELLANEOUS) ×10 IMPLANT
PATTIES SURGICAL .5 X1 (DISPOSABLE) IMPLANT
ROD REVERE 6.35 40MM (Rod) ×4 IMPLANT
SCREW 7.5X50MM (Screw) ×4 IMPLANT
SCREW REVERE 6.35 7.5X40 (Screw) ×4 IMPLANT
SPONGE LAP 4X18 X RAY DECT (DISPOSABLE) IMPLANT
SPONGE NEURO XRAY DETECT 1X3 (DISPOSABLE) IMPLANT
SPONGE SURGIFOAM ABS GEL 100 (HEMOSTASIS) IMPLANT
STRIP BIOACTIVE 20CC 25X100X8 (Miscellaneous) ×2 IMPLANT
STRIP CLOSURE SKIN 1/2X4 (GAUZE/BANDAGES/DRESSINGS) ×2 IMPLANT
SUT VIC AB 1 CT1 18XBRD ANBCTR (SUTURE) ×2 IMPLANT
SUT VIC AB 1 CT1 8-18 (SUTURE) ×2
SUT VIC AB 2-0 CP2 18 (SUTURE) ×4 IMPLANT
SYR 20CC LL (SYRINGE) ×2 IMPLANT
TOWEL GREEN STERILE (TOWEL DISPOSABLE) ×2 IMPLANT
TOWEL GREEN STERILE FF (TOWEL DISPOSABLE) ×2 IMPLANT
TRAY FOLEY MTR SLVR 16FR STAT (SET/KITS/TRAYS/PACK) ×2 IMPLANT
WATER STERILE IRR 1000ML POUR (IV SOLUTION) ×2 IMPLANT

## 2017-08-03 NOTE — Progress Notes (Signed)
Orthopedic Tech Progress Note Patient Details:  Jeffery Davis [birth year] [id number] Patient already has brace. Patient ID: Jeffery Davis, adult   DOB: [birth year], 72 y.o.   MRN: [id number]   Braulio Bosch 08/03/2017, 10:53 PM

## 2017-08-03 NOTE — Anesthesia Preprocedure Evaluation (Addendum)
Anesthesia Evaluation  Patient identified by MRN, date of birth, ID band Patient awake    Reviewed: Allergy & Precautions, NPO status , Patient's Chart, lab work & pertinent test results  Airway Mallampati: II  TM Distance: >3 FB Neck ROM: Full    Dental  (+) Teeth Intact, Dental Advisory Given   Pulmonary neg pulmonary ROS,    Pulmonary exam normal        Cardiovascular hypertension, Pt. on medications and Pt. on home beta blockers + Peripheral Vascular Disease  + dysrhythmias Atrial Fibrillation  Rhythm:Regular Rate:Normal     Neuro/Psych  Neuromuscular disease negative psych ROS   GI/Hepatic Neg liver ROS, GERD  Medicated,  Endo/Other  negative endocrine ROS  Renal/GU negative Renal ROS     Musculoskeletal  (+) Arthritis , Osteoarthritis,    Abdominal Normal abdominal exam  (+)   Peds  Hematology negative hematology ROS (+)   Anesthesia Other Findings   Reproductive/Obstetrics                           Lab Results  Component Value Date   WBC 5.7 08/01/2017   HGB 12.1 (L) 08/01/2017   HCT 37.5 (L) 08/01/2017   MCV 86.6 08/01/2017   PLT 252 08/01/2017   Lab Results  Component Value Date   CREATININE 0.92 08/01/2017   BUN 16 08/01/2017   NA 140 08/01/2017   K 3.9 08/01/2017   CL 108 08/01/2017   CO2 24 08/01/2017   Lab Results  Component Value Date   INR 1.09 08/01/2017   EKG: normal sinus rhythm, PAC's noted.  Anesthesia Physical Anesthesia Plan  ASA: III  Anesthesia Plan: General   Post-op Pain Management:    Induction: Intravenous  PONV Risk Score and Plan: 3 and Ondansetron, Dexamethasone and Treatment may vary due to age or medical condition  Airway Management Planned: Oral ETT  Additional Equipment: None  Intra-op Plan:   Post-operative Plan: Extubation in OR  Informed Consent: I have reviewed the patients History and Physical, chart, labs and  discussed the procedure including the risks, benefits and alternatives for the proposed anesthesia with the patient or authorized representative who has indicated his/her understanding and acceptance.   Dental advisory given  Plan Discussed with: CRNA  Anesthesia Plan Comments:         Anesthesia Quick Evaluation

## 2017-08-03 NOTE — Anesthesia Procedure Notes (Signed)
Procedure Name: Intubation Date/Time: 08/03/2017 11:26 AM Performed by: Clearnce Sorrel, CRNA Pre-anesthesia Checklist: Patient identified, Emergency Drugs available, Suction available, Patient being monitored and Timeout performed Patient Re-evaluated:Patient Re-evaluated prior to induction Oxygen Delivery Method: Circle system utilized Preoxygenation: Pre-oxygenation with 100% oxygen Induction Type: IV induction Ventilation: Mask ventilation without difficulty Laryngoscope Size: Mac and 3 Grade View: Grade I Tube type: Oral Tube size: 7.5 mm Number of attempts: 1 Airway Equipment and Method: Stylet Placement Confirmation: ETT inserted through vocal cords under direct vision,  positive ETCO2 and breath sounds checked- equal and bilateral Secured at: 23 cm Tube secured with: Tape Dental Injury: Teeth and Oropharynx as per pre-operative assessment

## 2017-08-03 NOTE — Op Note (Signed)
Brief history: The patient is a 72 year old white male on whom I previously performed a lumbar laminectomy for lumbar spinal stenosis.  The patient initially did well but developed recurrent back, buttock and leg pain consistent with a lumbosacral radiculopathy.  He failed medical management.  He was worked up with lumbar MRI and lumbar x-rays which demonstrated lumbosacral foraminal stenosis.  I discussed the various treatment options with the patient.  He has weighed the risks, benefits, and alternatives surgery and decided proceed with an L5-S1 decompression, instrumentation, and fusion.  Preoperative diagnosis: L5-S1 spondylosis, degenerative disc disease, spinal stenosis compressing L5 nerve root; lumbago; lumbar radiculopathy  Postoperative diagnosis: The same  Procedure: Bilateral L5-S1 laminotomy/foraminotomies to decompress the bilateral L5 and S1 nerve roots(the work required to do this was in addition to the work required to do the posterior lumbar interbody fusion because of the patient's spinal stenosis, facet arthropathy. Etc. requiring a wide decompression of the nerve roots.);  L5-S1 transforaminal lumbar interbody fusion with local morselized autograft bone and Kinnex graft extender; insertion of interbody prosthesis at L5-S1 (globus peek expandable interbody prosthesis); posterior nonsegmental instrumentation from L5 to S1 with globus titanium pedicle screws and rods; posterior lateral arthrodesis at L5-S1 with local morselized autograft bone and Kinnex bone graft extender.  Surgeon: Dr. Earle Gell  Asst.: Dr. Sherwood Gambler  Anesthesia: Gen. endotracheal  Estimated blood loss: 200 cc  Drains: None  Complications: None  Description of procedure: The patient was brought to the operating room by the anesthesia team. General endotracheal anesthesia was induced. The patient was turned to the prone position on the Wilson frame. The patient's lumbosacral region was then prepared with  Betadine scrub and Betadine solution. Sterile drapes were applied.  I then injected the area to be incised with Marcaine with epinephrine solution. I then used the scalpel to make a linear midline incision over the L5-S1 interspace, incising through his old surgical scar. I then used electrocautery to perform a bilateral subperiosteal dissection exposing the spinous process and lamina of L5 and S1. We then obtained intraoperative radiograph to confirm our location. We then inserted the Verstrac retractor to provide exposure.  I began the decompression by using the high speed drill to perform laminotomies at L5-S1 bilaterally. We then used the Kerrison punches to widen the laminotomy and removed the ligamentum flavum at 5 S1 on the left, and the scar tissue on the right. We used the Kerrison punches to remove the medial facets at L5-S1 bilaterally. We performed wide foraminotomies about the bilateral L5 and S1 nerve roots completing the decompression.  We now turned our attention to the posterior lumbar interbody fusion. I used a scalpel to incise the intervertebral disc at L5-S1 bilaterally. I then performed a partial intervertebral discectomy at L5-S1 bilaterally using the pituitary forceps. We prepared the vertebral endplates at L3-T3 bilaterally for the fusion by removing the soft tissues with the curettes. We then used the trial spacers to pick the appropriate sized interbody prosthesis. We prefilled his prosthesis with a combination of local morselized autograft bone that we obtained during the decompression as well as Kinnex bone graft extender. We inserted the prefilled prosthesis into the interspace at L5-S1 from the right, we then expanded the prosthesis. There was a good snug fit of the prosthesis in the interspace. We then filled and the remainder of the intervertebral disc space with local morselized autograft bone and Kinnex. This completed the posterior lumbar interbody arthrodesis.  We now  turned attention to the instrumentation. Under  fluoroscopic guidance we cannulated the bilateral L5 and S1 pedicles with the bone probe. We then removed the bone probe. We then tapped the pedicle with a 6.5 millimeter tap. We then removed the tap. We probed inside the tapped pedicle with a ball probe to rule out cortical breaches. We then inserted a 7.5 x 50 and 40 millimeter pedicle screw into the L5 and S1 pedicles bilaterally under fluoroscopic guidance. We then palpated along the medial aspect of the pedicles to rule out cortical breaches. There were none. The nerve roots were not injured. We then connected the unilateral pedicle screws with a lordotic rod. We compressed the construct and secured the rod in place with the caps. We then tightened the caps appropriately. This completed the instrumentation from L5-S1.  We now turned our attention to the posterior lateral arthrodesis at L5-S1 bilaterally. We used the high-speed drill to decorticate the remainder of the facets, pars, transverse process at L5-S1 bilaterally. We then applied a combination of local morselized autograft bone and Kinnex bone graft extender over these decorticated posterior lateral structures. This completed the posterior lateral arthrodesis.  We then obtained hemostasis using bipolar electrocautery. We irrigated the wound out with bacitracin solution. We inspected the thecal sac and nerve roots and noted they were well decompressed. We then removed the retractor. We placed vancomycin powder in the wound. We reapproximated patient's thoracolumbar fascia with interrupted #1 Vicryl suture. We reapproximated patient's subcutaneous tissue with interrupted 2-0 Vicryl suture. The reapproximated patient's skin with Steri-Strips and benzoin. The wound was then coated with bacitracin ointment. A sterile dressing was applied. The drapes were removed. The patient was subsequently returned to the supine position where they were extubated by the  anesthesia team. He was then transported to the post anesthesia care unit in stable condition. All sponge instrument and needle counts were reportedly correct at the end of this case.

## 2017-08-03 NOTE — H&P (Signed)
Subjective: The patient is a 72 year old white male on whom I previously performed a lumbar laminectomy.  He initially did well but has developed recurrent buttock and leg pain consistent with a lumbosacral radiculopathy.  He has failed medical management and was worked up with lumbar x-rays and lumbar MRI which demonstrated severe foraminal stenosis at L5-S1.  I discussed the various treatment options with him.  He has decided to proceed with surgery.  Past Medical History:  Diagnosis Date  . Actinic keratosis   . Allergy   . Arthritis    degenerative  . Atrial fibrillation ([place])    s/p cardioversion 07/18/17  . Benign fibroma of prostate   . Carpal tunnel syndrome, right   . Erectile dysfunction   . GERD (gastroesophageal reflux disease)   . Hypertension   . Lumbar herniated disc   . Lumbar stenosis with neurogenic claudication   . LV dysfunction   . Neural foraminal stenosis of lumbosacral spine   . Primary localized osteoarthrosis of left shoulder 03/01/2016  . Wears hearing aid in both ears     Past Surgical History:  Procedure Laterality Date  . CARDIAC CATHETERIZATION  2005   [place] N/A 07/18/2017   Procedure: CARDIOVERSION;  Surgeon: Teodoro Spray, MD;  Location: [facility] ORS;  Service: Cardiovascular;  Laterality: N/A;  . CATARACT EXTRACTION  06/2002  . COLONOSCOPY    . COLONOSCOPY WITH PROPOFOL N/A 05/03/2017   Procedure: COLONOSCOPY WITH PROPOFOL;  Surgeon: Manya Silvas, MD;  Location: [facility] ENDOSCOPY;  Service: Endoscopy;  Laterality: N/A;  . ESOPHAGOGASTRODUODENOSCOPY (EGD) WITH PROPOFOL N/A 01/08/2016   Procedure: ESOPHAGOGASTRODUODENOSCOPY (EGD) WITH PROPOFOL;  Surgeon: Manya Silvas, MD;  Location: [facility] ENDOSCOPY;  Service: Endoscopy;  Laterality: N/A;  . KNEE ARTHROSCOPY Right   . LUMBAR LAMINECTOMY/DECOMPRESSION MICRODISCECTOMY N/A 01/09/2017   Procedure: LAMINECTOMY AND FORAMINOTOMY LUMBAR THREE-FOUR LUMBAR FOUR-FIVE  LUMBAR FIVE  SACRAL ONE;  Surgeon: Newman Pies, MD;  Location: [place];  Service: Neurosurgery;  Laterality: N/A;  . SHOULDER SURGERY Left 2010  . TOTAL SHOULDER ARTHROPLASTY Left 03/01/2016   Procedure: REVERSE TOTAL SHOULDER ARTHROPLASTY;  Surgeon: Marchia Bond, MD;  Location: [place];  Service: Orthopedics;  Laterality: Left;    No Known Allergies  Social History   Tobacco Use  . Smoking status: Never Smoker  . Smokeless tobacco: Never Used  Substance Use Topics  . Alcohol use: Yes    Alcohol/week: 3.0 - 4.2 oz    Types: 4 - 6 Glasses of wine, 1 Standard drinks or equivalent per week    Comment: 1-2 glasses of wine daily    Family History  Problem Relation Age of Onset  . Cancer Mother        breast and pancreatic  . CAD Father   . Hypertension Father   . Healthy Daughter   . Healthy Son    Prior to Admission medications   Medication Sig Start Date End Date Taking? Authorizing Provider  acetaminophen (TYLENOL) 500 MG tablet Take 1,000 mg by mouth daily as needed for moderate pain or headache.   Yes [provider]  atorvastatin (LIPITOR) 10 MG tablet Take 1 tablet (10 mg total) by mouth daily. Patient taking differently: Take 10 mg by mouth every other day.  02/09/16  Yes Jerrol Banana., MD  calcium carbonate (TUMS - DOSED IN MG ELEMENTAL CALCIUM) 500 MG chewable tablet Chew 2 tablets by mouth daily as needed for indigestion or heartburn.   Yes [provider]  Cholecalciferol (VITAMIN D3) 5000 units TABS Take 5,000 Units by mouth daily.   Yes [provider]  Coenzyme Q10 (CO Q 10) 100 MG CAPS Take 100 mg by mouth 2 (two) times daily.    Yes [provider]  diphenhydramine-acetaminophen (TYLENOL PM) 25-500 MG TABS tablet Take 2 tablets by mouth at bedtime as needed (sleep).   Yes [provider]  HYDROcodone-acetaminophen (NORCO/VICODIN) 5-325 MG tablet Take 1 tablet by mouth at bedtime as needed for moderate pain.    Yes [provider]  ketoconazole (NIZORAL) 2 % cream Apply 1 application topically every other day. 04/24/17  Yes [provider]  lisinopril (PRINIVIL,ZESTRIL) 5 MG tablet Take 5 mg by mouth daily.   Yes [provider]  metoprolol succinate (TOPROL-XL) 25 MG 24 hr tablet Take 25 mg by mouth every evening.   Yes [provider]  Misc Natural Products (GLUCOSAMINE CHONDROITIN TRIPLE) TABS Take 1 tablet by mouth 2 (two) times daily.   Yes [provider]  omeprazole (PRILOSEC) 20 MG capsule TAKE 1 CAPSULE BY MOUTH EVERY DAY Patient taking differently: Take 20 mg by mouth every evening.  05/31/17  Yes Jerrol Banana., MD  rivaroxaban (XARELTO) 20 MG TABS tablet Take 20 mg by mouth daily with supper.   Yes [provider]  sildenafil (REVATIO) 20 MG tablet Take 1 tablet (20 mg total) by mouth 3 (three) times daily as needed (ED). 06/20/16  Yes Jerrol Banana., MD  Tetrahydroz-Polyvinyl Al-Povid (MURINE TEARS PLUS OP) Place 1 drop into both eyes daily as needed (allergies).   Yes [provider]     Review of Systems  Positive ROS: As above  All other systems have been reviewed and were otherwise negative with the exception of those mentioned in the HPI and as above.  Objective: Vital signs in last 24 hours: Temp:  [98 F (36.7 C)] 98 F (36.7 C) (05/23 0946) Pulse Rate:  [57] 57 (05/23 0946) Resp:  [20] 20 (05/23 0946) BP: (180)/(94) 180/94 (05/23 0946) SpO2:  [100 %] 100 % (05/23 0946) Weight:  [78.6 kg (173 lb 3.2 oz)] 78.6 kg (173 lb 3.2 oz) (05/23 1000) Estimated body mass index is 26.33 kg/m as calculated from the following:   Height as of this encounter: 5\' 8"  (1.727 m).   Weight as of this encounter: 78.6 kg (173 lb 3.2 oz).   General Appearance: Alert Head: Normocephalic, without obvious abnormality, atraumatic Eyes: PERRL, conjunctiva/corneas clear, EOM's intact,    Ears: Normal  Throat: Normal  Neck:  Supple, Back: unremarkable Lungs: Clear to auscultation bilaterally, respirations unlabored Heart: Regular rate and rhythm, no murmur, rub or gallop Abdomen: Soft, non-tender Extremities: Extremities normal, atraumatic, no cyanosis or edema Skin: unremarkable  NEUROLOGIC:   Mental status: alert and oriented,Motor Exam - grossly normal Sensory Exam - grossly normal Reflexes:  Coordination - grossly normal Gait - grossly normal Balance - grossly normal Cranial Nerves: I: smell Not tested  II: visual acuity  OS: Normal  OD: Normal   II: visual fields Full to confrontation  II: pupils Equal, round, reactive to light  III,VII: ptosis None  III,IV,VI: extraocular muscles  Full ROM  V: mastication Normal  V: facial light touch sensation  Normal  V,VII: corneal reflex  Present  VII: facial muscle function - upper  Normal  VII: facial muscle function - lower Normal  VIII: hearing Not tested  IX: soft palate elevation  Normal  IX,X:  gag reflex Present  XI: trapezius strength  5/5  XI: sternocleidomastoid strength 5/5  XI: neck flexion strength  5/5  XII: tongue strength  Normal    Data Review Lab Results  Component Value Date   WBC 5.7 08/01/2017   HGB 12.1 (L) 08/01/2017   HCT 37.5 (L) 08/01/2017   MCV 86.6 08/01/2017   PLT 252 08/01/2017   Lab Results  Component Value Date   NA 140 08/01/2017   K 3.9 08/01/2017   CL 108 08/01/2017   CO2 24 08/01/2017   BUN 16 08/01/2017   CREATININE 0.92 08/01/2017   GLUCOSE 94 08/01/2017   Lab Results  Component Value Date   INR 1.09 08/01/2017    Assessment/Plan: L5-S1 foraminal stenosis, radiculopathy, lumbago: I have discussed the situation with the patient.  I reviewed his imaging studies with him and pointed out the abnormalities.  We have discussed the various treatment options including surgery.  I have described the surgical treatment option of an L5-S1 decompression, instrumentation and fusion.  I have shown him  surgical models.  I have given him a surgical pamphlet.  We have discussed the risks, benefits, alternatives, expected postoperative course, and likelihood of achieving our goals with surgery.  I have answered all his questions.  He has decided to proceed with surgery.   Ophelia Charter 08/03/2017 10:32 AM

## 2017-08-03 NOTE — Transfer of Care (Signed)
Immediate Anesthesia Transfer of Care Note  Patient: Jeffery Davis  Procedure(s) Performed: POSTERIOR LUMBAR INTERBODY FUSION, INTERBODY PROSTHESIS, POSTERIOR INSTRUMENTATION AND FUSION  LUMBAR FIVE- SACRAL ONE (N/A Spine Lumbar)  Patient Location: PACU  Anesthesia Type:General  Level of Consciousness: awake, alert  and oriented  Airway & Oxygen Therapy: Patient Spontanous Breathing and Patient connected to nasal cannula oxygen  Post-op Assessment: Report given to RN, Post -op Vital signs reviewed and stable and Patient moving all extremities X 4  Post vital signs: Reviewed and stable  Last Vitals:  Vitals Value Taken Time  BP 131/72 08/03/2017  3:28 PM  Temp    Pulse 71 08/03/2017  3:31 PM  Resp 17 08/03/2017  3:31 PM  SpO2 98 % 08/03/2017  3:31 PM  Vitals shown include unvalidated device data.  Last Pain:  Vitals:   08/03/17 1528  TempSrc:   PainSc: (P) Asleep         Complications: No apparent anesthesia complications

## 2017-08-04 LAB — BASIC METABOLIC PANEL
Anion gap: 9 (ref 5–15)
BUN: 14 mg/dL (ref 6–20)
CO2: 25 mmol/L (ref 22–32)
Calcium: 8.6 mg/dL — ABNORMAL LOW (ref 8.9–10.3)
Chloride: 104 mmol/L (ref 101–111)
Creatinine, Ser: 1.02 mg/dL (ref 0.61–1.24)
GFR calc Af Amer: >60 mL/min (ref >60)
GFR calc non Af Amer: >60 mL/min (ref >60)
Glucose, Bld: 120 mg/dL — ABNORMAL HIGH (ref 65–99)
Potassium: 3.9 mmol/L (ref 3.5–5.1)
Sodium: 138 mmol/L (ref 135–145)

## 2017-08-04 LAB — CBC
HCT: 32.9 % — ABNORMAL LOW (ref 39.0–52.0)
Hemoglobin: 10.6 g/dL — ABNORMAL LOW (ref 13.0–17.0)
MCH: 28.5 pg (ref 26.0–34.0)
MCHC: 32.2 g/dL (ref 30.0–36.0)
MCV: 88.4 fL (ref 78.0–100.0)
Platelets: 222 10*3/uL (ref 150–400)
RBC: 3.72 MIL/uL — ABNORMAL LOW (ref 4.22–5.81)
RDW: 17.2 % — ABNORMAL HIGH (ref 11.5–15.5)
WBC: 9.7 10*3/uL (ref 4.0–10.5)

## 2017-08-04 MED ORDER — TAMSULOSIN HCL 0.4 MG PO CAPS
0.4000 mg | ORAL_CAPSULE | Freq: Every day | ORAL | Status: DC
  Administered 2017-08-04 (×2): 0.4 mg via ORAL
  Filled 2017-08-04: qty 1

## 2017-08-04 MED ORDER — OXYCODONE HCL 5 MG PO TABS: 5.0000 mg | ORAL_TABLET | ORAL | 0 refills | Status: DC | PRN

## 2017-08-04 MED ORDER — TAMSULOSIN HCL 0.4 MG PO CAPS: 0.4000 mg | ORAL_CAPSULE | Freq: Once | ORAL | Status: DC

## 2017-08-04 MED ORDER — CYCLOBENZAPRINE HCL 10 MG PO TABS: 10.0000 mg | ORAL_TABLET | Freq: Three times a day (TID) | ORAL | 1 refills | Status: DC | PRN

## 2017-08-04 MED ORDER — DOCUSATE SODIUM 100 MG PO CAPS: 100.0000 mg | ORAL_CAPSULE | Freq: Two times a day (BID) | ORAL | 0 refills | Status: DC

## 2017-08-04 MED FILL — Thrombin For Soln 5000 Unit: CUTANEOUS | Qty: 5000 | Status: AC

## 2017-08-04 NOTE — Progress Notes (Signed)
Physical Therapy Treatment Patient Details Name: Jeffery Davis MRN: [id number] DOB: 1945-09-23 Today's Date: 08/04/2017    History of Present Illness Pt is a 72y.o. male PMH total L shoulder arthroplasty and previous lumbar laminectomy L3-4,L4-5,S1. Pt s/p L5-S1 foraminal stenosis, radiculopathy, lumbago.     PT Comments    Patient evaluated by Physical Therapy with no further acute PT needs identified. All education has been completed and the patient has no further questions. Patient demonstrates good pain control and suspect good recovery based on  Patient's high level of activity prior to surgery. Ambulating 400 feet with no assistive device without difficulty and negotiating 12 steps with left railing and supervision. No follow-up Physical Therapy or equipment needs. PT is signing off. Thank you for this referral.    Follow Up Recommendations  No PT follow up     Equipment Recommendations  None recommended by PT    Recommendations for Other Services       Precautions / Restrictions Precautions Precautions: Back Precaution Booklet Issued: Yes (comment) Precaution Comments: Precuation handout and explanation/demonstration provided Required Braces or Orthoses: Spinal Brace Spinal Brace: Applied in standing position;Lumbar corset Restrictions Weight Bearing Restrictions: No Other Position/Activity Restrictions: no bending/arching, no lifting, no twisting    Mobility  Bed Mobility Overal bed mobility: Modified Independent             General bed mobility comments: OOB in recliner  Transfers Overall transfer level: Modified independent Equipment used: None Transfers: Sit to/from Stand           General transfer comment: pt educated on appropriate body mechanics during mobility to adhere to back precuations  Ambulation/Gait Ambulation/Gait assistance: Supervision Ambulation Distance (Feet): 400 Feet Assistive device: None Gait Pattern/deviations: Decreased  step length - left     General Gait Details: Requiring supervision throughout gait but no apparent loss of balance or unsteadiness   Stairs Stairs: Yes Stairs assistance: Supervision Stair Management: One rail Left Number of Stairs: 12 General stair comments: step over step pattern   Wheelchair Mobility    Modified Rankin (Stroke Patients Only)       Balance Overall balance assessment: No apparent balance deficits (not formally assessed)                                          Cognition Arousal/Alertness: Awake/alert Behavior During Therapy: WFL for tasks assessed/performed Overall Cognitive Status: Within Functional Limits for tasks assessed                                        Exercises      General Comments General comments (skin integrity, edema, etc.): Educated on activity recommendations/pacing      Pertinent Vitals/Pain Pain Assessment: Faces Pain Score: 1  Faces Pain Scale: Hurts a little bit Pain Location: back Pain Descriptors / Indicators: Discomfort;Sore Pain Intervention(s): Monitored during session    Home Living Family/patient expects to be discharged to:: Private residence Living Arrangements: Spouse/significant other Available Help at Discharge: Family;Available 24 hours/day Type of Home: House Home Access: Stairs to enter Entrance Stairs-Rails: None Home Layout: Multi-level Home Equipment: Other (comment)(reacher)      Prior Function Level of Independence: Independent      Comments: Pt was very active, walked 24miles daily. No history of falls.  PT Goals (current goals can now be found in the care plan section) Acute Rehab PT Goals Patient Stated Goal: to go to beach PT Goal Formulation: All assessment and education complete, DC therapy    Frequency           PT Plan      Co-evaluation              AM-PAC PT "6 Clicks" Daily Activity  Outcome Measure  Difficulty turning over  in bed (including adjusting bedclothes, sheets and blankets)?: None Difficulty moving from lying on back to sitting on the side of the bed? : None Difficulty sitting down on and standing up from a chair with arms (e.g., wheelchair, bedside commode, etc,.)?: None Help needed moving to and from a bed to chair (including a wheelchair)?: None Help needed walking in hospital room?: A Little Help needed climbing 3-5 steps with a railing? : A Little 6 Click Score: 22    End of Session Equipment Utilized During Treatment: Gait belt Activity Tolerance: Patient tolerated treatment well Patient left: in chair;with call bell/phone within reach;with family/visitor present Nurse Communication: Mobility status PT Visit Diagnosis: Difficulty in walking, not elsewhere classified (R26.2);Pain Pain - part of body: (back)     Time: 9357-0177 PT Time Calculation (min) (ACUTE ONLY): 14 min  Charges:                       G Codes:      Ellamae Sia, PT, DPT Acute Rehabilitation Services  Pager: [phone]    Willy Eddy 08/04/2017, 10:33 AM

## 2017-08-04 NOTE — Discharge Summary (Signed)
Physician Discharge Summary  Patient ID: Jeffery Davis MRN: [id number] DOB/AGE: 1945/05/30 72 y.o.  Admit date: 08/03/2017 Discharge date: 08/04/2017  Admission Diagnoses:Lumbosacral foraminal stenosis, lumbosacral radiculopathy  Discharge Diagnoses:  The same Active Problems:   Neural foraminal stenosis of lumbosacral spine   Discharged Condition: good  Hospital Course:  I performed an L5-S1 decompression, instrumentation, and fusion on the patient on 08/03/2017.  The surgery went well.    The patient's postoperative course was remarkable initially for urinary retention.  This resolved.    On postoperative day 1. The patient requested discharge to home.  He was given written and oral discharge instructions.  He was instructed to resume his Xarelto on 08/06/2017.  All his questions were answered.  Consults:  Physical therapy, occupational therapy Significant Diagnostic Studies: none Treatments: L5-S1 decompression, instrumentation, and fusion. Discharge Exam: Blood pressure 120/71, pulse (!) 58, temperature 98.2 F (36.8 C), temperature source Oral, resp. rate 16, height 5\' 8"  (1.727 m), weight 78.6 kg (173 lb 3.2 oz), SpO2 99 %.   The patient is alert and pleasant.  He looks well.  His strength is normal.  Disposition:   Home  Discharge Instructions    Call MD for:  difficulty breathing, headache or visual disturbances   Complete by:  As directed    Call MD for:  extreme fatigue   Complete by:  As directed    Call MD for:  hives   Complete by:  As directed    Call MD for:  persistant dizziness or light-headedness   Complete by:  As directed    Call MD for:  persistant nausea and vomiting   Complete by:  As directed    Call MD for:  redness, tenderness, or signs of infection (pain, swelling, redness, odor or green/yellow discharge around incision site)   Complete by:  As directed    Call MD for:  severe uncontrolled pain   Complete by:  As directed    Call MD for:   temperature >100.4   Complete by:  As directed    Diet - low sodium heart healthy   Complete by:  As directed    Discharge instructions   Complete by:  As directed    Call [phone] for a followup appointment. Take a stool softener while you are using pain medications.   Driving Restrictions   Complete by:  As directed    Do not drive for 2 weeks.   Increase activity slowly   Complete by:  As directed    Lifting restrictions   Complete by:  As directed    Do not lift more than 5 pounds. No excessive bending or twisting.   May shower / Bathe   Complete by:  As directed    Remove the dressing for 3 days after surgery.  You may shower, but leave the incision alone.   Remove dressing in 48 hours   Complete by:  As directed    Your stitches are under the scan and will dissolve by themselves. The Steri-Strips will fall off after you take a few showers. Do not rub back or pick at the wound, Leave the wound alone.     Allergies as of 08/04/2017   No Known Allergies     Medication List    STOP taking these medications   acetaminophen 500 MG tablet Commonly known as:  TYLENOL   HYDROcodone-acetaminophen 5-325 MG tablet Commonly known as:  NORCO/VICODIN     TAKE these medications  atorvastatin 10 MG tablet Commonly known as:  LIPITOR Take 1 tablet (10 mg total) by mouth daily. What changed:  when to take this   calcium carbonate 500 MG chewable tablet Commonly known as:  TUMS - dosed in mg elemental calcium Chew 2 tablets by mouth daily as needed for indigestion or heartburn.   Co Q 10 100 MG Caps Take 100 mg by mouth 2 (two) times daily.   cyclobenzaprine 10 MG tablet Commonly known as:  FLEXERIL Take 1 tablet (10 mg total) by mouth 3 (three) times daily as needed for muscle spasms.   diphenhydramine-acetaminophen 25-500 MG Tabs tablet Commonly known as:  TYLENOL PM Take 2 tablets by mouth at bedtime as needed (sleep).   docusate sodium 100 MG capsule Commonly  known as:  COLACE Take 1 capsule (100 mg total) by mouth 2 (two) times daily.   Glucosamine Chondroitin Triple Tabs Take 1 tablet by mouth 2 (two) times daily.   ketoconazole 2 % cream Commonly known as:  NIZORAL Apply 1 application topically every other day.   lisinopril 5 MG tablet Commonly known as:  PRINIVIL,ZESTRIL Take 5 mg by mouth daily.   metoprolol succinate 25 MG 24 hr tablet Commonly known as:  TOPROL-XL Take 25 mg by mouth every evening.   MURINE TEARS PLUS OP Place 1 drop into both eyes daily as needed (allergies).   omeprazole 20 MG capsule Commonly known as:  PRILOSEC TAKE 1 CAPSULE BY MOUTH EVERY DAY What changed:    how much to take  how to take this  when to take this  additional instructions   oxyCODONE 5 MG immediate release tablet Commonly known as:  Oxy IR/ROXICODONE Take 1 tablet (5 mg total) by mouth every 4 (four) hours as needed for moderate pain ((score 4 to 6)).   rivaroxaban 20 MG Tabs tablet Commonly known as:  XARELTO Take 20 mg by mouth daily with supper.   sildenafil 20 MG tablet Commonly known as:  REVATIO Take 1 tablet (20 mg total) by mouth 3 (three) times daily as needed (ED).   Vitamin D3 5000 units Tabs Take 5,000 Units by mouth daily.        Signed: Ophelia Charter 08/04/2017, 3:38 PM

## 2017-08-04 NOTE — Progress Notes (Signed)
Patient alert and oriented, mae's well, voiding adequate amount of urine, swallowing without difficulty, no c/o pain at time of discharge. Patient discharged home with family. Script and discharged instructions given to patient. Patient and family stated understanding of instructions given. Patient has an appointment with Dr. Arnoldo Morale. Patient also instructed to start xarelto on Sunday 08/06/2017 per MD.

## 2017-08-04 NOTE — Anesthesia Postprocedure Evaluation (Signed)
Anesthesia Post Note  Patient: IRVAN TIEDT  Procedure(s) Performed: POSTERIOR LUMBAR INTERBODY FUSION, INTERBODY PROSTHESIS, POSTERIOR INSTRUMENTATION AND FUSION  LUMBAR FIVE- SACRAL ONE (N/A Spine Lumbar)     Patient location during evaluation: Other Anesthesia Type: General Level of consciousness: awake and alert Pain management: pain level controlled Vital Signs Assessment: post-procedure vital signs reviewed and stable Respiratory status: spontaneous breathing, nonlabored ventilation, respiratory function stable and patient connected to nasal cannula oxygen Cardiovascular status: blood pressure returned to baseline and stable Postop Assessment: no apparent nausea or vomiting Anesthetic complications: no    Last Vitals:  Vitals:   08/04/17 0000 08/04/17 0341  BP: 126/85 112/66  Pulse: 69 (!) 56  Resp: 16 18  Temp: 36.8 C 36.9 C  SpO2: 97% 96%    Last Pain:  Vitals:   08/04/17 0701  TempSrc:   PainSc: 3                  Effie Berkshire

## 2017-08-04 NOTE — Progress Notes (Signed)
Occupational Therapy Evaluation Patient Details Name: Jeffery Davis MRN: [id number] DOB: 1945-07-23 Today's Date: 08/04/2017    History of Present Illness Pt is a 72y.o. male PMH total L shoulder arthroplasty and previous lumbar laminectomy L3-4,L4-5,S1. Pt s/p L5-S1 foraminal stenosis, radiculopathy, lumbago.    Clinical Impression   PTA, pt was walking 3 miles per day and was independent in ADLs and IADLs and lives with wife, Coralyn Mark. Patient evaluated by Occupational Therapy with no further acute OT needs identified. Following education on back precautions, pt was able to return demonstrated at modified independent with ADLs, mobility, and transfers, demonstrating clear understanding and adherence to back precautions. All education has been completed. Pt has additional questions about appropriate sitting time during road trips, as he hopes to return to beach house soon. See below for any follow-up Occupational Therapy or equipment needs. OT to sign off. Thank you for referral.      Follow Up Recommendations  No OT follow up    Equipment Recommendations  None recommended by OT    Recommendations for Other Services       Precautions / Restrictions Precautions Precautions: Back Precaution Comments: Precuation handout and explanation/demonstration provided Required Braces or Orthoses: Spinal Brace Spinal Brace: Applied in standing position;Lumbar corset Restrictions Weight Bearing Restrictions: No Other Position/Activity Restrictions: no bending/arching, no lifting, no twisting      Mobility Bed Mobility Overal bed mobility: Modified Independent             General bed mobility comments: Understands and adheres to back precuations  Transfers Overall transfer level: Needs assistance Equipment used: None Transfers: Sit to/from Stand           General transfer comment: pt educated on appropriate body mechanics during mobility to adhere to back precuations    Balance  Overall balance assessment: Independent                                         ADL either performed or assessed with clinical judgement   ADL Overall ADL's : Needs assistance/impaired                                       General ADL Comments: following education/handout/demonstration pt able to return demonstrate understanding of back brace wear schedule, compensatory techniques, and modifications to adhere to precautions and reduce risk of falls.      Vision Baseline Vision/History: Wears glasses Wears Glasses: Reading only Patient Visual Report: No change from baseline       Perception     Praxis      Pertinent Vitals/Pain Pain Assessment: 0-10 Pain Score: 1  Pain Location: back Pain Descriptors / Indicators: Discomfort;Sore Pain Intervention(s): Monitored during session     Hand Dominance Right   Extremity/Trunk Assessment Upper Extremity Assessment Upper Extremity Assessment: Overall WFL for tasks assessed   Lower Extremity Assessment Lower Extremity Assessment: Overall WFL for tasks assessed;LLE deficits/detail LLE Deficits / Details: slight L foot drop LLE Sensation: (history of numbness in L foot, reports sensation returning)   Cervical / Trunk Assessment Cervical / Trunk Assessment: Other exceptions(adhering to back precautions)   Communication Communication Communication: No difficulties   Cognition Arousal/Alertness: Awake/alert Behavior During Therapy: WFL for tasks assessed/performed Overall Cognitive Status: Within Functional Limits for tasks assessed  General Comments  Educated on importance of mobility and sitting for 78min at a time. Pt has questions about appropriate length of sitting during road trips (hopes to return to beach house soon)    Exercises     Shoulder Instructions      Home Living Family/patient expects to be discharged to:: Private  residence Living Arrangements: Spouse/significant other Available Help at Discharge: Family;Available 24 hours/day Type of Home: House Home Access: Stairs to enter [employer] of Steps: 2 Entrance Stairs-Rails: None Home Layout: Multi-level   Alternate Level Stairs-Rails: Can reach both Bathroom Shower/Tub: Occupational psychologist: Standard Bathroom Accessibility: Yes How Accessible: Accessible via walker Home Equipment: Other (comment)(reacher)          Prior Functioning/Environment Level of Independence: Independent        Comments: Pt was very active, walked 29miles daily. No history of falls.        OT Problem List: Decreased range of motion      OT Treatment/Interventions:      OT Goals(Current goals can be found in the care plan section) Acute Rehab OT Goals Patient Stated Goal: to go to beach OT Goal Formulation: With patient Time For Goal Achievement: 08/18/17 Potential to Achieve Goals: Good  OT Frequency:     Barriers to D/C:            Co-evaluation              AM-PAC PT "6 Clicks" Daily Activity     Outcome Measure Help from another person eating meals?: None Help from another person taking care of personal grooming?: A Little Help from another person toileting, which includes using toliet, bedpan, or urinal?: None Help from another person bathing (including washing, rinsing, drying)?: None Help from another person to put on and taking off regular upper body clothing?: None Help from another person to put on and taking off regular lower body clothing?: None 6 Click Score: 23   End of Session Equipment Utilized During Treatment: Back brace Nurse Communication: Mobility status  Activity Tolerance: Patient tolerated treatment well Patient left: in chair;with call bell/phone within reach  OT Visit Diagnosis: Other abnormalities of gait and mobility (R26.89)                Time: 1694-5038 OT Time Calculation (min): 32  min Charges:  OT General Charges $OT Visit: 1 Visit OT Evaluation $OT Eval Low Complexity: 1 Low OT Treatments $Self Care/Home Management : 8-22 mins G-Codes:     Dorinda Hill OTS   08/04/2017, 9:29 AM

## 2017-08-04 NOTE — Progress Notes (Signed)
Subjective: The patient is alert and pleasant.  He looks well.  He has had some urinary hesitancy.  Objective: Vital signs in last 24 hours: Temp:  [97.3 F (36.3 C)-98.4 F (36.9 C)] 98.2 F (36.8 C) (05/24 0742) Pulse Rate:  [56-76] 63 (05/24 0742) Resp:  [10-23] 16 (05/24 0742) BP: (112-180)/(66-104) 138/79 (05/24 0742) SpO2:  [93 %-100 %] 98 % (05/24 0742) Weight:  [78.6 kg (173 lb 3.2 oz)] 78.6 kg (173 lb 3.2 oz) (05/23 1000) Estimated body mass index is 26.33 kg/m as calculated from the following:   Height as of this encounter: 5\' 8"  (1.727 m).   Weight as of this encounter: 78.6 kg (173 lb 3.2 oz).   Intake/Output from previous day: 05/23 0701 - 05/24 0700 In: 1500 [I.V.:1500] Out: 1250 [Urine:1050; Blood:200] Intake/Output this shift: No intake/output data recorded.  Physical exam patient is alert and pleasant.  His strength is normal.  Lab Results: Recent Labs    08/01/17 0923 08/04/17 0514  WBC 5.7 9.7  HGB 12.1* 10.6*  HCT 37.5* 32.9*  PLT 252 222   BMET Recent Labs    08/01/17 0923 08/04/17 0514  NA 140 138  K 3.9 3.9  CL 108 104  CO2 24 25  GLUCOSE 94 120*  BUN 16 14  CREATININE 0.92 1.02  CALCIUM 8.9 8.6*    Studies/Results: Dg Lumbar Spine 2-3 Views  Result Date: 08/03/2017 CLINICAL DATA:  Status post surgical posterior fusion of L5-S1. EXAM: DG C-ARM 61-120 MIN; LUMBAR SPINE - 2-3 VIEW FLUOROSCOPY TIME:  48 seconds. COMPARISON:  Radiograph of same day. FINDINGS: Two intraoperative fluoroscopic images of the lower lumbar spine were obtained. These demonstrate intrapedicular screw placement bilaterally at L5 and S1. Stabilization cage is seen at L5-S1 as well. IMPRESSION: Surgical posterior fusion of L5-S1. Electronically Signed   By: Marijo Conception, M.D.   On: 08/03/2017 15:00   Dg Lumbar Spine 1 View  Result Date: 08/03/2017 CLINICAL DATA:  Intraoperative localization EXAM: LUMBAR SPINE - 1 VIEW COMPARISON:  05/05/2017 FINDINGS: Lateral  radiograph of the lumbar spine was obtained and reveals surgical retractors and instruments at the L5-S1 interspace. The numbering nomenclature is similar to that utilized on prior MRI. IMPRESSION: Intraoperative localization at L5-S1. Electronically Signed   By: Inez Catalina M.D.   On: 08/03/2017 13:13   Dg C-arm 1-60 Min  Result Date: 08/03/2017 CLINICAL DATA:  Status post surgical posterior fusion of L5-S1. EXAM: DG C-ARM 61-120 MIN; LUMBAR SPINE - 2-3 VIEW FLUOROSCOPY TIME:  48 seconds. COMPARISON:  Radiograph of same day. FINDINGS: Two intraoperative fluoroscopic images of the lower lumbar spine were obtained. These demonstrate intrapedicular screw placement bilaterally at L5 and S1. Stabilization cage is seen at L5-S1 as well. IMPRESSION: Surgical posterior fusion of L5-S1. Electronically Signed   By: Marijo Conception, M.D.   On: 08/03/2017 15:00    Assessment/Plan: Postop day #1: The patient is doing well.  We will mobilize him with PT/OT.  He may go home later on today.  I gave him his discharge instructions and answered all his questions.  LOS: 1 day     Ophelia Charter 08/04/2017, 7:46 AM

## 2017-08-04 NOTE — Progress Notes (Signed)
OT Note - Addendum    08/04/17 0900  OT Visit Information  Last OT Received On 08/04/17  OT Time Calculation  OT Start Time (ACUTE ONLY) 0741  OT Stop Time (ACUTE ONLY) 0813  OT Time Calculation (min) 32 min  OT General Charges  $OT Visit 1 Visit  OT Evaluation  $OT Eval Low Complexity 1 Low  OT Treatments  $Self Care/Home Management  8-22 mins  Maurie Boettcher, OT/L  OT Clinical Specialist [phone]

## 2017-08-09 MED FILL — Heparin Sodium (Porcine) Inj 1000 Unit/ML: INTRAMUSCULAR | Qty: 30 | Status: AC

## 2017-08-09 MED FILL — Sodium Chloride IV Soln 0.9%: INTRAVENOUS | Qty: 1000 | Status: AC

## 2017-08-15 ENCOUNTER — Telehealth: Payer: Self-pay | Admitting: Family Medicine

## 2017-08-15 MED ORDER — ATORVASTATIN CALCIUM 10 MG PO TABS: 10.0000 mg | ORAL_TABLET | ORAL | 3 refills | Status: DC

## 2017-08-15 NOTE — Telephone Encounter (Signed)
Needs refill on his  Atorvastatin 10 mg  [facility]  [place]

## 2017-08-18 DIAGNOSIS — M9983 Other biomechanical lesions of lumbar region: Secondary | ICD-10-CM | POA: Diagnosis not present

## 2017-08-21 DIAGNOSIS — I428 Other cardiomyopathies: Secondary | ICD-10-CM | POA: Diagnosis not present

## 2017-08-21 DIAGNOSIS — I481 Persistent atrial fibrillation: Secondary | ICD-10-CM | POA: Diagnosis not present

## 2017-08-31 ENCOUNTER — Encounter: Payer: Self-pay | Admitting: Family Medicine

## 2017-08-31 ENCOUNTER — Telehealth: Payer: Self-pay

## 2017-08-31 ENCOUNTER — Ambulatory Visit (INDEPENDENT_AMBULATORY_CARE_PROVIDER_SITE_OTHER): Payer: PPO | Admitting: Family Medicine

## 2017-08-31 VITALS — BP 136/72 | HR 80 | Temp 98.8°F | Resp 16 | Wt 174.0 lb

## 2017-08-31 DIAGNOSIS — T464X5A Adverse effect of angiotensin-converting-enzyme inhibitors, initial encounter: Secondary | ICD-10-CM | POA: Diagnosis not present

## 2017-08-31 DIAGNOSIS — R05 Cough: Secondary | ICD-10-CM | POA: Diagnosis not present

## 2017-08-31 DIAGNOSIS — I10 Essential (primary) hypertension: Secondary | ICD-10-CM | POA: Diagnosis not present

## 2017-08-31 DIAGNOSIS — R058 Other specified cough: Secondary | ICD-10-CM

## 2017-08-31 MED ORDER — HYDROCODONE-HOMATROPINE 5-1.5 MG/5ML PO SYRP: 5.0000 mL | ORAL_SOLUTION | Freq: Four times a day (QID) | ORAL | 0 refills | Status: DC | PRN

## 2017-08-31 NOTE — Telephone Encounter (Signed)
LM for pt to CB and schedule AWV prior to CPE on 09/25/17. -MM

## 2017-08-31 NOTE — Progress Notes (Signed)
Patient: Jeffery Davis Adult    DOB: [birth year]   72 y.o.   MRN: [id number] Visit Date: 08/31/2017  Today's Provider: [name], MD   Chief Complaint  Patient presents with  . Cough   Subjective:    Cough  This is a new problem. The current episode started 1 to 4 weeks ago (about 4 weeks). Pertinent negatives include no chest pain, chills, fever, headaches, postnasal drip, rhinorrhea, sore throat, shortness of breath or wheezing. There is no history of environmental allergies.   Patient reports that he has had symptoms since he had his back surgery 4 weeks ago. He reports that it is non productive.     No Known Allergies   Current Outpatient Medications:  .  amiodarone (PACERONE) 200 MG tablet, Take 1 tablet by mouth daily., Disp: , Rfl:  .  atorvastatin (LIPITOR) 10 MG tablet, Take 1 tablet (10 mg total) by mouth every other day., Disp: 46 tablet, Rfl: 3 .  calcium carbonate (TUMS - DOSED IN MG ELEMENTAL CALCIUM) 500 MG chewable tablet, Chew 2 tablets by mouth daily as needed for indigestion or heartburn., Disp: , Rfl:  .  Cholecalciferol (VITAMIN D3) 5000 units TABS, Take 5,000 Units by mouth daily., Disp: , Rfl:  .  Coenzyme Q10 (CO Q 10) 100 MG CAPS, Take 100 mg by mouth 2 (two) times daily. , Disp: , Rfl:  .  cyclobenzaprine (FLEXERIL) 10 MG tablet, Take 1 tablet (10 mg total) by mouth 3 (three) times daily as needed for muscle spasms., Disp: 50 tablet, Rfl: 1 .  diphenhydramine-acetaminophen (TYLENOL PM) 25-500 MG TABS tablet, Take 2 tablets by mouth at bedtime as needed (sleep)., Disp: , Rfl:  .  ketoconazole (NIZORAL) 2 % cream, Apply 1 application topically every other day., Disp: , Rfl:  .  lisinopril (PRINIVIL,ZESTRIL) 5 MG tablet, Take 5 mg by mouth daily., Disp: , Rfl:  .  metoprolol succinate (TOPROL-XL) 25 MG 24 hr tablet, Take 25 mg by mouth every evening., Disp: , Rfl:  .  Misc Natural Products (GLUCOSAMINE CHONDROITIN TRIPLE) TABS, Take 1 tablet by  mouth 2 (two) times daily., Disp: , Rfl:  .  omeprazole (PRILOSEC) 20 MG capsule, TAKE 1 CAPSULE BY MOUTH EVERY DAY (Patient taking differently: Take 20 mg by mouth every evening. ), Disp: 90 capsule, Rfl: 3 .  oxyCODONE (OXY IR/ROXICODONE) 5 MG immediate release tablet, Take 1 tablet (5 mg total) by mouth every 4 (four) hours as needed for moderate pain ((score 4 to 6))., Disp: 30 tablet, Rfl: 0 .  rivaroxaban (XARELTO) 20 MG TABS tablet, Take 20 mg by mouth daily with supper., Disp: , Rfl:  .  sildenafil (REVATIO) 20 MG tablet, Take 1 tablet (20 mg total) by mouth 3 (three) times daily as needed (ED)., Disp: 90 tablet, Rfl: 3 .  Tetrahydroz-Polyvinyl Al-Povid (MURINE TEARS PLUS OP), Place 1 drop into both eyes daily as needed (allergies)., Disp: , Rfl:  .  docusate sodium (COLACE) 100 MG capsule, Take 1 capsule (100 mg total) by mouth 2 (two) times daily. (Patient not taking: Reported on 08/31/2017), Disp: 60 capsule, Rfl: 0  Review of Systems  Constitutional: Positive for fatigue. Negative for activity change, appetite change, chills, diaphoresis, fever and unexpected weight change.  HENT: Negative for congestion, postnasal drip, rhinorrhea, sore throat and voice change.   Eyes: Negative.   Respiratory: Positive for cough. Negative for shortness of breath, wheezing and stridor.   Cardiovascular: Negative  for chest pain, palpitations and leg swelling.  Endocrine: Negative.   Musculoskeletal: Positive for back pain.  Skin: Negative.   Allergic/Immunologic: Negative for environmental allergies.  Neurological: Negative for dizziness and headaches.  Psychiatric/Behavioral: Negative.     Social History   Tobacco Use  . Smoking status: Never Smoker  . Smokeless tobacco: Never Used  Substance Use Topics  . Alcohol use: Yes    Alcohol/week: 3.0 - 4.2 oz    Types: 4 - 6 Glasses of wine, 1 Standard drinks or equivalent per week    Comment: 1-2 glasses of wine daily   Objective:   BP 136/72  (BP Location: Right Arm, Patient Position: Sitting, Cuff Size: Normal)   Pulse 80   Temp 98.8 F (37.1 C)   Resp 16   Wt 174 lb (78.9 kg)   SpO2 98%   BMI 26.46 kg/m  Vitals:   08/31/17 1041  BP: 136/72  Pulse: 80  Resp: 16  Temp: 98.8 F (37.1 C)  SpO2: 98%  Weight: 174 lb (78.9 kg)     Physical Exam  Constitutional: He is oriented to person, place, and time. He appears well-developed and well-nourished.  HENT:  Head: Normocephalic and atraumatic.  Right Ear: External ear normal.  Left Ear: External ear normal.  Nose: Nose normal.  Eyes: No scleral icterus.  Neck: No thyromegaly present.  Cardiovascular: Normal rate, regular rhythm and normal heart sounds.  Pulmonary/Chest: Effort normal and breath sounds normal.  Abdominal: Soft.  Musculoskeletal: He exhibits no edema.  Neurological: He is alert and oriented to person, place, and time.  Skin: Skin is warm and dry.  Psychiatric: He has a normal mood and affect. His behavior is normal. Judgment and thought content normal.        Assessment & Plan:     Cough Stop ACEI. Tussionex q hs. RTC 2-4 weeks. S/p recent LS spine surgery     I have done the exam and reviewed the above chart and it is accurate to the best of my knowledge. Development worker, community has been used in this note in any air is in the dictation or transcription are unintentional.  [name], MD  Accident

## 2017-08-31 NOTE — Patient Instructions (Signed)
Discontinue Lisinopril

## 2017-09-04 DIAGNOSIS — I481 Persistent atrial fibrillation: Secondary | ICD-10-CM | POA: Diagnosis not present

## 2017-09-06 ENCOUNTER — Ambulatory Visit (INDEPENDENT_AMBULATORY_CARE_PROVIDER_SITE_OTHER): Payer: PPO | Admitting: Internal Medicine

## 2017-09-06 ENCOUNTER — Encounter: Payer: Self-pay | Admitting: Internal Medicine

## 2017-09-06 VITALS — BP 142/82 | HR 79 | Ht 68.0 in | Wt 173.0 lb

## 2017-09-06 DIAGNOSIS — I481 Persistent atrial fibrillation: Secondary | ICD-10-CM

## 2017-09-06 DIAGNOSIS — I428 Other cardiomyopathies: Secondary | ICD-10-CM | POA: Diagnosis not present

## 2017-09-06 DIAGNOSIS — I4819 Other persistent atrial fibrillation: Secondary | ICD-10-CM

## 2017-09-06 DIAGNOSIS — I483 Typical atrial flutter: Secondary | ICD-10-CM | POA: Diagnosis not present

## 2017-09-06 DIAGNOSIS — I4891 Unspecified atrial fibrillation: Secondary | ICD-10-CM

## 2017-09-06 MED ORDER — METOPROLOL TARTRATE 50 MG PO TABS: ORAL_TABLET | ORAL | 0 refills | Status: DC

## 2017-09-06 NOTE — Patient Instructions (Addendum)
Medication Instructions:  Your physician recommends that you continue on your current medications as directed. Please refer to the Current Medication list given to you today. If you need a refill on your cardiac medications before your next appointment, please call your pharmacy.  Labwork: You will get lab work within 30 days of your procedure:  BMP and CBC.  Take your lab orders to a [facility] for processing.  Testing/Procedures:  Your physician has requested that you have cardiac CT. Cardiac computed tomography (CT) is a painless test that uses an x-ray machine to take clear, detailed pictures of your heart. For further information please visit [email or web address]. Please follow instruction sheet as given.  Your physician has recommended that you have an ablation. Catheter ablation is a medical procedure used to treat some cardiac arrhythmias (irregular heartbeats). During catheter ablation, a long, thin, flexible tube is put into a blood vessel in your groin (upper thigh), or neck. This tube is called an ablation catheter. It is then guided to your heart through the blood vessel. Radio frequency waves destroy small areas of heart tissue where abnormal heartbeats may cause an arrhythmia to start. Please see the instruction sheet given to you today.  Follow-Up: You will follow up with Roderic Palau, NP with the Atrial fibrillation (Afib) clinic 4 weeks after your ablation.  You will follow up with Dr. Rayann Heman 3 months after your procedure.     CARDIAC CT INSTRUCTIONS:  Please arrive at the [facility] main entrance of [facility], [place] [address] [phone]  Proceed to the [facility] Radiology Department (First Floor).  Please follow these instructions carefully (unless otherwise directed):  Hold all erectile dysfunction medications at least 48 hours prior to test.  On the Night Before the Test: . Drink plenty of  water. . Do not consume any caffeinated/decaffeinated beverages or chocolate 12 hours prior to your test. . Do not take any antihistamines 12 hours prior to your test. .  On the Day of the Test: . Drink plenty of water. Do not drink any water within one hour of the test. . Do not eat any food 4 hours prior to the test. . You may take your regular medications prior to the test. IF NOT ON A BETA BLOCKER - Take 50 mg of lopressor (metoprolol) one hour before the test.    After the Test: . Drink plenty of water. . After receiving IV contrast, you may experience a mild flushed feeling. This is normal. . On occasion, you may experience a mild rash up to 24 hours after the test. This is not dangerous. If this occurs, you can take Benadryl 25 mg and increase your fluid intake. . If you experience trouble breathing, this can be serious. If it is severe call 911 IMMEDIATELY. If it is mild, please call our office.    ABLATION INSTRUCTIONS:  Please arrive at the [facility] main entrance of [facility] hospital at: 7:30 am on October 17, 2017 Do not eat or drink after midnight prior to procedure On the morning of your procedure do not take any medications. Plan for one night stay.  You will need someone to drive you home at discharge.     Cardiac Ablation Cardiac ablation is a procedure to disable (ablate) a small amount of heart tissue in very specific places. The heart has many electrical connections. Sometimes these connections are abnormal and can cause the heart to beat very fast or  irregularly. Ablating some of the problem areas can improve the heart rhythm or return it to normal. Ablation may be done for people who:  Have Wolff-Parkinson-White syndrome.  Have fast heart rhythms (tachycardia).  Have taken medicines for an abnormal heart rhythm (arrhythmia) that were not effective or caused side effects.  Have a high-risk heartbeat that may be life-threatening.  During the procedure, a  small incision is made in the neck or the groin, and a long, thin, flexible tube (catheter) is inserted into the incision and moved to the heart. Small devices (electrodes) on the tip of the catheter will send out electrical currents. A type of X-ray (fluoroscopy) will be used to help guide the catheter and to provide images of the heart. Tell a health care provider about:  Any allergies you have.  All medicines you are taking, including vitamins, herbs, eye drops, creams, and over-the-counter medicines.  Any problems you or family members have had with anesthetic medicines.  Any blood disorders you have.  Any surgeries you have had.  Any medical conditions you have, such as kidney failure.  Whether you are pregnant or may be pregnant. What are the risks? Generally, this is a safe procedure. However, problems may occur, including:  Infection.  Bruising and bleeding at the catheter insertion site.  Bleeding into the chest, especially into the sac that surrounds the heart. This is a serious complication.  Stroke or blood clots.  Damage to other structures or organs.  Allergic reaction to medicines or dyes.  Need for a permanent pacemaker if the normal electrical system is damaged. A pacemaker is a small computer that sends electrical signals to the heart and helps your heart beat normally.  The procedure not being fully effective. This may not be recognized until months later. Repeat ablation procedures are sometimes required.  What happens before the procedure?  Follow instructions from your health care provider about eating or drinking restrictions.  Ask your health care provider about: ? Changing or stopping your regular medicines. This is especially important if you are taking diabetes medicines or blood thinners. ? Taking medicines such as aspirin and ibuprofen. These medicines can thin your blood. Do not take these medicines before your procedure if your health care  provider instructs you not to.  Plan to have someone take you home from the hospital or clinic.  If you will be going home right after the procedure, plan to have someone with you for 24 hours. What happens during the procedure?  To lower your risk of infection: ? Your health care team will wash or sanitize their hands. ? Your skin will be washed with soap. ? Hair may be removed from the incision area.  An IV tube will be inserted into one of your veins.  You will be given a medicine to help you relax (sedative).  The skin on your neck or groin will be numbed.  An incision will be made in your neck or your groin.  A needle will be inserted through the incision and into a large vein in your neck or groin.  A catheter will be inserted into the needle and moved to your heart.  Dye may be injected through the catheter to help your surgeon see the area of the heart that needs treatment.  Electrical currents will be sent from the catheter to ablate heart tissue in desired areas. There are three types of energy that may be used to ablate heart tissue: ? Heat (radiofrequency energy). ?  Laser energy. ? Extreme cold (cryoablation).  When the necessary tissue has been ablated, the catheter will be removed.  Pressure will be held on the catheter insertion area to prevent excessive bleeding.  A bandage (dressing) will be placed over the catheter insertion area. The procedure may vary among health care providers and hospitals. What happens after the procedure?  Your blood pressure, heart rate, breathing rate, and blood oxygen level will be monitored until the medicines you were given have worn off.  Your catheter insertion area will be monitored for bleeding. You will need to lie still for a few hours to ensure that you do not bleed from the catheter insertion area.  Do not drive for 24 hours or as long as directed by your health care provider. Summary  Cardiac ablation is a procedure  to disable (ablate) a small amount of heart tissue in very specific places. Ablating some of the problem areas can improve the heart rhythm or return it to normal.  During the procedure, electrical currents will be sent from the catheter to ablate heart tissue in desired areas. This information is not intended to replace advice given to you by your health care provider. Make sure you discuss any questions you have with your health care provider. Document Released: 07/17/2008 Document Revised: 01/18/2016 Document Reviewed: 01/18/2016 Elsevier Interactive Patient Education  [employer].

## 2017-09-06 NOTE — Progress Notes (Signed)
Electrophysiology Office Note   Date:  09/06/2017   ID:  Jeffery, Davis [birth year], MRN [id number]  PCP:  Jerrol Banana., MD  Cardiologist:  Dr Ubaldo Glassing Primary Electrophysiologist: Thompson Grayer, MD    CC: afib   History of Present Illness: Jeffery Davis is a 72 y.o. adult who presents today for electrophysiology evaluation.   He is referred by Dr Ubaldo Glassing for EP consultation regarding atrial fibrillation.  He was initially diagnosed with atrial fibrillation April 1st after presented to Dr Alben Spittle office to follow-up on symptoms of dizziness, presyncope and ataxia.  Of note, the week prior, he had a preoperative ekg which was "normal".  Upon being diagnosed with atrial fibrllation, he was referred to Dr Ubaldo Glassing. He was placed on anticoagulation and cardioverted.  He converted to sinus rhythm but eventually returned to afib.  He reports SOB and fatigue with his afib.  He has been placed on amiodarone but has not improved.  Today, he is in atrial flutter. He has been found on echo to have EF of 40%.  myoview was unrevealing.  There is concern that his reduced EF may be tachycardia mediated. Today, he denies symptoms of palpitations, chest pain, orthopnea, PND, lower extremity edema, claudication, dizziness, presyncope, syncope, bleeding, or neurologic sequela. The patient is tolerating medications without difficulties and is otherwise without complaint today.    Past Medical History:  Diagnosis Date  . Actinic keratosis   . Allergy   . Arthritis    degenerative  . Atrial fibrillation ([name])    s/p cardioversion 07/18/17  . Benign fibroma of prostate   . Carpal tunnel syndrome, right   . Erectile dysfunction   . GERD (gastroesophageal reflux disease)   . Hypertension   . Lumbar herniated disc   . Lumbar stenosis with neurogenic claudication   . LV dysfunction   . Neural foraminal stenosis of lumbosacral spine   . Primary localized osteoarthrosis of left shoulder 03/01/2016  .  Wears hearing aid in both ears    Past Surgical History:  Procedure Laterality Date  . CARDIAC CATHETERIZATION  2005   [place] N/A 07/18/2017   Procedure: CARDIOVERSION;  Surgeon: Teodoro Spray, MD;  Location: [facility] ORS;  Service: Cardiovascular;  Laterality: N/A;  . CATARACT EXTRACTION  06/2002  . COLONOSCOPY    . COLONOSCOPY WITH PROPOFOL N/A 05/03/2017   Procedure: COLONOSCOPY WITH PROPOFOL;  Surgeon: Manya Silvas, MD;  Location: [facility] ENDOSCOPY;  Service: Endoscopy;  Laterality: N/A;  . ESOPHAGOGASTRODUODENOSCOPY (EGD) WITH PROPOFOL N/A 01/08/2016   Procedure: ESOPHAGOGASTRODUODENOSCOPY (EGD) WITH PROPOFOL;  Surgeon: Manya Silvas, MD;  Location: [facility] ENDOSCOPY;  Service: Endoscopy;  Laterality: N/A;  . KNEE ARTHROSCOPY Right   . LUMBAR LAMINECTOMY/DECOMPRESSION MICRODISCECTOMY N/A 01/09/2017   Procedure: LAMINECTOMY AND FORAMINOTOMY LUMBAR THREE-FOUR LUMBAR FOUR-FIVE  LUMBAR FIVE SACRAL ONE;  Surgeon: Newman Pies, MD;  Location: [place];  Service: Neurosurgery;  Laterality: N/A;  . SHOULDER SURGERY Left 2010  . TOTAL SHOULDER ARTHROPLASTY Left 03/01/2016   Procedure: REVERSE TOTAL SHOULDER ARTHROPLASTY;  Surgeon: Marchia Bond, MD;  Location: [place];  Service: Orthopedics;  Laterality: Left;     Current Outpatient Medications  Medication Sig Dispense Refill  . amiodarone (PACERONE) 200 MG tablet Take 1 tablet by mouth daily.    Marland Kitchen atorvastatin (LIPITOR) 10 MG tablet Take 1 tablet (10 mg total) by mouth every other day. 46 tablet 3  . calcium carbonate (TUMS - DOSED IN MG ELEMENTAL CALCIUM)  500 MG chewable tablet Chew 2 tablets by mouth daily as needed for indigestion or heartburn.    . Cholecalciferol (VITAMIN D3) 5000 units TABS Take 5,000 Units by mouth daily.    . Coenzyme Q10 (CO Q 10) 100 MG CAPS Take 100 mg by mouth 2 (two) times daily.     . diphenhydramine-acetaminophen (TYLENOL PM) 25-500 MG TABS tablet Take 2 tablets by mouth at bedtime as  needed (sleep).    Marland Kitchen ketoconazole (NIZORAL) 2 % cream Apply 1 application topically every other day.    . Misc Natural Products (GLUCOSAMINE CHONDROITIN TRIPLE) TABS Take 1 tablet by mouth 2 (two) times daily.    Marland Kitchen omeprazole (PRILOSEC) 20 MG capsule TAKE 1 CAPSULE BY MOUTH EVERY DAY (Patient taking differently: Take 20 mg by mouth every evening. ) 90 capsule 3  . rivaroxaban (XARELTO) 20 MG TABS tablet Take 20 mg by mouth daily with supper.    . sildenafil (REVATIO) 20 MG tablet Take 1 tablet (20 mg total) by mouth 3 (three) times daily as needed (ED). 90 tablet 3   No current facility-administered medications for this visit.     Allergies:   Patient has no known allergies.   Social History:  The patient  reports that he has never smoked. He has never used smokeless tobacco. He reports that he drinks about 3.0 - 4.2 oz of alcohol per week. He reports that he does not use drugs.   Family History:  The patient's  family history includes CAD in his father; Cancer in his mother; Healthy in his daughter and son; Hypertension in his father.    ROS:  Please see the history of present illness.   All other systems are personally reviewed and negative.    PHYSICAL EXAM: VS:  BP (!) 142/82   Pulse 79   Ht 5\' 8"  (1.727 m)   Wt 173 lb (78.5 kg)   SpO2 97%   BMI 26.30 kg/m  , BMI Body mass index is 26.3 kg/m. GEN: Well nourished, well developed, in no acute distress  HEENT: normal  Neck: no JVD, carotid bruits, or masses Cardiac: iRRR; no murmurs, rubs, or gallops,no edema  Respiratory:  clear to auscultation bilaterally, normal work of breathing GI: soft, nontender, nondistended, + BS MS: no deformity or atrophy  Skin: warm and dry  Neuro:  Strength and sensation are intact Psych: euthymic mood, full affect  EKG:  EKG is ordered today. The ekg ordered today is personally reviewed and shows typical appearing atrial flutter, V rate 83 bpm, nonspecific St/T changes, Qtc 470 msec   Recent  Labs: 01/03/2017: ALT 20 08/04/2017: BUN 14; Creatinine, Ser 1.02; Hemoglobin 10.6; Platelets 222; Potassium 3.9; Sodium 138  personally reviewed   Lipid Panel     Component Value Date/Time   CHOL 166 08/25/2016 0945   CHOL 173 09/30/2011 0740   TRIG 98 08/25/2016 0945   TRIG 375 (H) 09/30/2011 0740   HDL 50 08/25/2016 0945   HDL 41 09/30/2011 0740   CHOLHDL 2.8 09/29/2015 1119   VLDL 75 (H) 09/30/2011 0740   LDLCALC 96 08/25/2016 0945   LDLCALC 57 09/30/2011 0740   personally reviewed   Wt Readings from Last 3 Encounters:  09/06/17 173 lb (78.5 kg)  08/31/17 174 lb (78.9 kg)  08/03/17 173 lb 3.2 oz (78.6 kg)      Other studies personally reviewed: Additional studies/ records that were reviewed today include: Dr Bethanne Ginger notes, prior echo/ myoview in [place]  Review  of the above records today demonstrates: as above   ASSESSMENT AND PLAN:  1.  Persistent afib/ typical atrial flutter The patient has symptomatic, recurrent atrial fibrillation and atrial flutter. he has failed medical therapy with amiodarone. Chads2vasc score is at least 3 if not 5 (concerns for prior stroke).  he is anticoagulated with xarelto . Therapeutic strategies for afib/ atrial flutter including medicine and ablation were discussed in detail with the patient today. Risk, benefits, and alternatives to EP study and radiofrequency ablation were also discussed in detail today. These risks include but are not limited to stroke, bleeding, vascular damage, tamponade, perforation, damage to the esophagus, lungs, and other structures, pulmonary vein stenosis, worsening renal function, and death. The patient understands these risk and wishes to proceed.  We will therefore proceed with catheter ablation at the next available time.  Carto, ICE, anesthesia are requested for the procedure.  Will also obtain Cardiac CT prior to the procedure to exclude LAA thrombus and further evaluate atrial anatomy.   2.  Nonischemic CM Hopefully will improve with sinus rhythm, He recently stopped ace inhibitor due to cough Dr Ubaldo Glassing has plans to follow-up and continue medicine titration.   Current medicines are reviewed at length with the patient today.   The patient does not have concerns regarding his medicines.  The following changes were made today:  none    Signed, Thompson Grayer, MD  09/06/2017 12:24 PM     [address] [place] [address] [phone] (office) [phone] (fax)

## 2017-09-12 NOTE — Telephone Encounter (Signed)
Scheduled for 10/03/17 @ 9:40 AM. -MM

## 2017-09-18 ENCOUNTER — Other Ambulatory Visit: Payer: Self-pay | Admitting: Internal Medicine

## 2017-09-18 DIAGNOSIS — I4891 Unspecified atrial fibrillation: Secondary | ICD-10-CM | POA: Diagnosis not present

## 2017-09-19 LAB — CBC WITH DIFFERENTIAL/PLATELET
Basophils Absolute: 0 10*3/uL (ref 0.0–0.2)
Basos: 1 %
EOS (ABSOLUTE): 0.1 10*3/uL (ref 0.0–0.4)
Eos: 2 %
Hematocrit: 38.8 % (ref 37.5–51.0)
Hemoglobin: 12.4 g/dL — ABNORMAL LOW (ref 13.0–17.7)
Immature Grans (Abs): 0 10*3/uL (ref 0.0–0.1)
Immature Granulocytes: 0 %
Lymphocytes Absolute: 2.1 10*3/uL (ref 0.7–3.1)
Lymphs: 35 %
MCH: 28 pg (ref 26.6–33.0)
MCHC: 32 g/dL (ref 31.5–35.7)
MCV: 88 fL (ref 79–97)
Monocytes Absolute: 0.7 10*3/uL (ref 0.1–0.9)
Monocytes: 12 %
Neutrophils Absolute: 3 10*3/uL (ref 1.4–7.0)
Neutrophils: 50 %
Platelets: 403 10*3/uL (ref 150–450)
RBC: 4.43 x10E6/uL (ref 4.14–5.80)
RDW: 16.2 % — ABNORMAL HIGH (ref 12.3–15.4)
WBC: 6 10*3/uL (ref 3.4–10.8)

## 2017-09-19 LAB — BASIC METABOLIC PANEL
BUN/Creatinine Ratio: 15 (ref 10–24)
BUN: 16 mg/dL (ref 8–27)
CO2: 22 mmol/L (ref 20–29)
Calcium: 9.5 mg/dL (ref 8.6–10.2)
Chloride: 105 mmol/L (ref 96–106)
Creatinine, Ser: 1.1 mg/dL (ref 0.76–1.27)
GFR calc Af Amer: 77 mL/min/{1.73_m2} (ref >59)
GFR calc non Af Amer: 67 mL/min/{1.73_m2} (ref >59)
Glucose: 93 mg/dL (ref 65–99)
Potassium: 4.5 mmol/L (ref 3.5–5.2)
Sodium: 141 mmol/L (ref 134–144)

## 2017-09-20 DIAGNOSIS — I428 Other cardiomyopathies: Secondary | ICD-10-CM | POA: Diagnosis not present

## 2017-09-20 DIAGNOSIS — I481 Persistent atrial fibrillation: Secondary | ICD-10-CM | POA: Diagnosis not present

## 2017-09-25 ENCOUNTER — Ambulatory Visit: Payer: Self-pay | Admitting: Family Medicine

## 2017-10-03 ENCOUNTER — Ambulatory Visit (INDEPENDENT_AMBULATORY_CARE_PROVIDER_SITE_OTHER): Payer: PPO

## 2017-10-03 VITALS — BP 140/80 | HR 61 | Temp 98.7°F | Ht 68.0 in | Wt 176.4 lb

## 2017-10-03 DIAGNOSIS — Z0000 Encounter for general adult medical examination without abnormal findings: Secondary | ICD-10-CM

## 2017-10-03 NOTE — Progress Notes (Signed)
Subjective:   Jeffery Davis is a 72 y.o. male who presents for Medicare Annual/Subsequent preventive examination.  Review of Systems:  N/A  Cardiac Risk Factors include: advanced age (>43men, >31 women);male gender;dyslipidemia;hypertension     Objective:    Vitals: BP 140/80 (BP Location: Right Arm)   Pulse 61   Temp 98.7 F (37.1 C) (Oral)   Ht 5\' 8"  (1.727 m)   Wt 176 lb 6.4 oz (80 kg)   BMI 26.82 kg/m   Body mass index is 26.82 kg/m.  Advanced Directives 10/03/2017 08/01/2017 07/18/2017 06/05/2017 05/03/2017 01/03/2017 09/27/2016  Does Patient Have a Medical Advance Directive? Yes Yes No Yes Yes Yes Yes  Type of Paramedic of [place];Living will Living will - [place];Living will [place];Living will [place];Living will [place];Living will  Does patient want to make changes to medical advance directive? - No - Patient declined - - - No - Patient declined -  Copy of [place] in Chart? Yes - - Yes Yes Yes Yes  Would patient like information on creating a medical advance directive? - No - Patient declined No - Patient declined - - - -    Tobacco Social History   Tobacco Use  Smoking Status Never Smoker  Smokeless Tobacco Never Used     Counseling given: Not Answered   Clinical Intake:  Pre-visit preparation completed: Yes  Pain : 0-10 Pain Score: 2 (post sx 8 weeks) Pain Location: Back Pain Orientation: Lower Pain Descriptors / Indicators: Stabbing Pain Frequency: Intermittent     Nutritional Status: BMI 25 -29 Overweight Nutritional Risks: None Diabetes: No  How often do you need to have someone help you when you read instructions, pamphlets, or other written materials from your doctor or pharmacy?: 1 - Never  Interpreter Needed?: No  Information entered by :: [facility], LPN  Past Medical History:  Diagnosis Date  . Actinic  keratosis   . Allergy   . Arthritis    degenerative  . Atrial fibrillation ([place])    s/p cardioversion 07/18/17  . Benign fibroma of prostate   . Carpal tunnel syndrome, right   . Erectile dysfunction   . GERD (gastroesophageal reflux disease)   . Hyperlipidemia   . Hypertension   . Lumbar herniated disc   . Lumbar stenosis with neurogenic claudication   . LV dysfunction   . Neural foraminal stenosis of lumbosacral spine   . Primary localized osteoarthrosis of left shoulder 03/01/2016  . Wears hearing aid in both ears    Past Surgical History:  Procedure Laterality Date  . CARDIAC CATHETERIZATION  2005   [place] N/A 07/18/2017   Procedure: CARDIOVERSION;  Surgeon: Teodoro Spray, MD;  Location: [facility] ORS;  Service: Cardiovascular;  Laterality: N/A;  . CATARACT EXTRACTION  06/2002  . COLONOSCOPY    . COLONOSCOPY WITH PROPOFOL N/A 05/03/2017   Procedure: COLONOSCOPY WITH PROPOFOL;  Surgeon: Manya Silvas, MD;  Location: [facility] ENDOSCOPY;  Service: Endoscopy;  Laterality: N/A;  . ESOPHAGOGASTRODUODENOSCOPY (EGD) WITH PROPOFOL N/A 01/08/2016   Procedure: ESOPHAGOGASTRODUODENOSCOPY (EGD) WITH PROPOFOL;  Surgeon: Manya Silvas, MD;  Location: [facility] ENDOSCOPY;  Service: Endoscopy;  Laterality: N/A;  . KNEE ARTHROSCOPY Right   . LUMBAR LAMINECTOMY/DECOMPRESSION MICRODISCECTOMY N/A 01/09/2017   Procedure: LAMINECTOMY AND FORAMINOTOMY LUMBAR THREE-FOUR LUMBAR FOUR-FIVE  LUMBAR FIVE SACRAL ONE;  Surgeon: Newman Pies, MD;  Location: [place];  Service: Neurosurgery;  Laterality: N/A;  . SHOULDER SURGERY Left 2010  . TOTAL SHOULDER ARTHROPLASTY Left 03/01/2016   Procedure: REVERSE TOTAL SHOULDER ARTHROPLASTY;  Surgeon: Marchia Bond, MD;  Location: [place];  Service: Orthopedics;  Laterality: Left;   Family History  Problem Relation Age of Onset  . Cancer Mother        breast and pancreatic  . CAD Father   . Hypertension Father   . Healthy Daughter   . Healthy  Son    Social History   Socioeconomic History  . Marital status: Married    Spouse name: Coralyn Mark  . Number of children: 2  . Years of education: bachelors  . Highest education level: Bachelor's degree (e.g., BA, AB, BS)  Occupational History  . Occupation: Retired     Comment: worked at [employer] 12 years.     Employer: [employer]  Social Needs  . Financial resource strain: Not hard at all  . Food insecurity:    Worry: Never true    Inability: Never true  . Transportation needs:    Medical: No    Non-medical: No  Tobacco Use  . Smoking status: Never Smoker  . Smokeless tobacco: Never Used  Substance and Sexual Activity  . Alcohol use: Yes    Alcohol/week: 2.4 - 3.6 oz    Types: 4 - 6 Glasses of wine per week    Comment: 1-2 glasses of wine daily  . Drug use: No  . Sexual activity: Not on file  Lifestyle  . Physical activity:    Days per week: Not on file    Minutes per session: Not on file  . Stress: Only a little  Relationships  . Social connections:    Talks on phone: Not on file    Gets together: Not on file    Attends religious service: Not on file    Active member of club or organization: Not on file    Attends meetings of clubs or organizations: Not on file    Relationship status: Not on file  Other Topics Concern  . Not on file  Social History Narrative  . Not on file    Outpatient Encounter Medications as of 10/03/2017  Medication Sig  . amiodarone (PACERONE) 200 MG tablet Take 1 tablet by mouth daily.  Marland Kitchen atorvastatin (LIPITOR) 10 MG tablet Take 1 tablet (10 mg total) by mouth every other day.  . calcium carbonate (TUMS - DOSED IN MG ELEMENTAL CALCIUM) 500 MG chewable tablet Chew 2 tablets by mouth daily as needed for indigestion or heartburn.  . Cholecalciferol (VITAMIN D3) 5000 units TABS Take 5,000 Units by mouth daily.  . Coenzyme Q10 (CO Q 10) 100 MG CAPS Take 100 mg by mouth 2 (two) times daily.   .  diphenhydramine-acetaminophen (TYLENOL PM) 25-500 MG TABS tablet Take 2 tablets by mouth at bedtime as needed (sleep).  Marland Kitchen ketoconazole (NIZORAL) 2 % cream Apply 1 application topically every other day.  . metoprolol tartrate (LOPRESSOR) 50 MG tablet Take one tablet one hour prior to cardiac CT  . Misc Natural Products (GLUCOSAMINE CHONDROITIN TRIPLE) TABS Take 1 tablet by mouth 2 (two) times daily.  Marland Kitchen omeprazole (PRILOSEC) 20 MG capsule TAKE 1 CAPSULE BY MOUTH EVERY DAY (Patient taking differently: Take 20 mg by mouth every evening. )  . rivaroxaban (XARELTO) 20 MG TABS tablet Take 20 mg by mouth daily with supper.  . sildenafil (REVATIO) 20 MG tablet Take 1 tablet (20 mg total)  by mouth 3 (three) times daily as needed (ED).  Marland Kitchen telmisartan (MICARDIS) 20 MG tablet Take 20 mg by mouth daily.    No facility-administered encounter medications on file as of 10/03/2017.     Activities of Daily Living In your present state of health, do you have any difficulty performing the following activities: 10/03/2017 08/01/2017  Hearing? Y N  Comment Wears bilateral hearing aids.  -  Vision? N N  Difficulty concentrating or making decisions? N N  Walking or climbing stairs? N N  Dressing or bathing? N N  Doing errands, shopping? N N  Preparing Food and eating ? N -  Using the Toilet? N -  In the past six months, have you accidently leaked urine? N -  Do you have problems with loss of bowel control? N -  Managing your Medications? N -  Managing your Finances? N -  Housekeeping or managing your Housekeeping? N -  Some recent data might be hidden    Patient Care Team: Jerrol Banana., MD as PCP - General (Family Medicine) Idelle Leech, [place] as Consulting Physician (Optometry) Ralene Bathe, MD as Consulting Physician (Dermatology) Ubaldo Glassing Javier Docker, MD as Consulting Physician (Cardiology) Thompson Grayer, MD as Consulting Physician (Cardiology) Newman Pies, MD as Consulting Physician  (Neurosurgery) Marchia Bond, MD as Consulting Physician (Orthopedic Surgery)   Assessment:   This is a routine wellness examination for Sony.  Exercise Activities and Dietary recommendations Current Exercise Habits: Home exercise routine, Type of exercise: walking, Time (Minutes): 60, Frequency (Times/Week): 5, Weekly Exercise (Minutes/Week): 300, Intensity: Mild  Goals    . DIET - REDUCE PORTION SIZE     Recommend decreasing portion sizes by half and eating 3 small meals a day with two healthy snacks in between.        Fall Risk Fall Risk  10/03/2017 09/27/2016 09/29/2015 11/10/2014  Falls in the past year? No No Yes No  Number falls in past yr: - - 1 -  Injury with Fall? - - Yes -  Comment - - fell on treadmill -  Risk for fall due to : - - History of fall(s) -  Follow up - - Falls evaluation completed -   Is the patient's home free of loose throw rugs in walkways, pet beds, electrical cords, etc?   yes      Grab bars in the bathroom? yes      Handrails on the stairs?   yes      Adequate lighting?   yes  Timed Get Up and Go Performed: N/A  Depression Screen PHQ 2/9 Scores 10/03/2017 10/03/2017 09/27/2016 09/27/2016  PHQ - 2 Score 0 0 0 0  PHQ- 9 Score 1 - 0 -    Cognitive Function: Pt declined screening today.      6CIT Screen 09/27/2016  What Year? 0 points  What month? 0 points  What time? 0 points  Count back from 20 0 points  Months in reverse 0 points  Repeat phrase 2 points  Total Score 2    Immunization History  Administered Date(s) Administered  . Influenza, High Dose Seasonal PF 01/08/2015, 12/08/2015, 12/30/2016  . Influenza,inj,Quad PF,6+ Mos 01/07/2014  . Pneumococcal Conjugate-13 11/10/2014  . Pneumococcal Polysaccharide-23 10/06/2011  . Td 12/22/2003  . Tdap 10/06/2011  . Zoster 03/14/2010    Qualifies for Shingles Vaccine? Due for Shingles vaccine. Declined my offer to administer today. Education has been provided regarding the importance of  this vaccine. Pt  has been advised to call her insurance company to determine her out of pocket expense. Advised she may also receive this vaccine at her local pharmacy or Health Dept. Verbalized acceptance and understanding.  Screening Tests Health Maintenance  Topic Date Due  . INFLUENZA VACCINE  10/12/2017  . COLONOSCOPY  05/03/2020  . TETANUS/TDAP  10/05/2021  . Hepatitis C Screening  Completed  . PNA vac Low Risk Adult  Completed   Cancer Screenings: Lung: Low Dose CT Chest recommended if Age 33-80 years, 30 pack-year currently smoking OR have quit w/in 15years. Patient does not qualify. Colorectal: Up to date  Additional Screenings:  Hepatitis C Screening: Up to date      Plan:  I have personally reviewed and addressed the Medicare Annual Wellness questionnaire and have noted the following in the patient's chart:  A. Medical and social history B. Use of alcohol, tobacco or illicit drugs  C. Current medications and supplements D. Functional ability and status E.  Nutritional status F.  Physical activity G. Advance directives H. List of other physicians I.  Hospitalizations, surgeries, and ER visits in previous 12 months J.  [place] such as hearing and vision if needed, cognitive and depression L. Referrals and appointments - none  In addition, I have reviewed and discussed with patient certain preventive protocols, quality metrics, and best practice recommendations. A written personalized care plan for preventive services as well as general preventive health recommendations were provided to patient.  See attached scanned questionnaire for additional information.   Signed,  Fabio Neighbors, LPN Nurse Health Advisor   Nurse Recommendations: None.

## 2017-10-03 NOTE — Patient Instructions (Addendum)
[name] , Thank you for taking time to come for your Medicare Wellness Visit. I appreciate your ongoing commitment to your health goals. Please review the following plan we discussed and let me know if I can assist you in the future.   Screening recommendations/referrals: Colonoscopy: Up to date Recommended yearly ophthalmology/optometry visit for glaucoma screening and checkup Recommended yearly dental visit for hygiene and checkup  Vaccinations: Influenza vaccine: Up to date Pneumococcal vaccine: Up to date Tdap vaccine: Up to date Shingles vaccine: Pt declines today.     Advanced directives: Already on file.  Conditions/risks identified: Recommend decreasing portion sizes by half and eating 3 small meals a day with two healthy snacks in between.    Next appointment: 10/04/17 @ 8:20 AM with Dr Rosanna Randy  Preventive Care 53 Years and Older, Male Preventive care refers to lifestyle choices and visits with your health care provider that can promote health and wellness. What does preventive care include?  A yearly physical exam. This is also called an annual well check.  Dental exams once or twice a year.  Routine eye exams. Ask your health care provider how often you should have your eyes checked.  Personal lifestyle choices, including:  Daily care of your teeth and gums.  Regular physical activity.  Eating a healthy diet.  Avoiding tobacco and drug use.  Limiting alcohol use.  Practicing safe sex.  Taking low doses of aspirin every day.  Taking vitamin and mineral supplements as recommended by your health care provider. What happens during an annual well check? The services and screenings done by your health care provider during your annual well check will depend on your age, overall health, lifestyle risk factors, and family history of disease. Counseling  Your health care provider may ask you questions about your:  Alcohol use.  Tobacco use.  Drug  use.  Emotional well-being.  Home and relationship well-being.  Sexual activity.  Eating habits.  History of falls.  Memory and ability to understand (cognition).  Work and work Statistician. Screening  You may have the following tests or measurements:  Height, weight, and BMI.  Blood pressure.  Lipid and cholesterol levels. These may be checked every 5 years, or more frequently if you are over 68 years old.  Skin check.  Lung cancer screening. You may have this screening every year starting at age 28 if you have a 30-pack-year history of smoking and currently smoke or have quit within the past 15 years.  Fecal occult blood test (FOBT) of the stool. You may have this test every year starting at age 22.  Flexible sigmoidoscopy or colonoscopy. You may have a sigmoidoscopy every 5 years or a colonoscopy every 10 years starting at age 86.  Prostate cancer screening. Recommendations will vary depending on your family history and other risks.  Hepatitis C blood test.  Hepatitis B blood test.  Sexually transmitted disease (STD) testing.  Diabetes screening. This is done by checking your blood sugar (glucose) after you have not eaten for a while (fasting). You may have this done every 1-3 years.  Abdominal aortic aneurysm (AAA) screening. You may need this if you are a current or former smoker.  Osteoporosis. You may be screened starting at age 51 if you are at high risk. Talk with your health care provider about your test results, treatment options, and if necessary, the need for more tests. Vaccines  Your health care provider may recommend certain vaccines, such as:  Influenza vaccine. This is  recommended every year.  Tetanus, diphtheria, and acellular pertussis (Tdap, Td) vaccine. You may need a Td booster every 10 years.  Zoster vaccine. You may need this after age 38.  Pneumococcal 13-valent conjugate (PCV13) vaccine. One dose is recommended after age  62.  Pneumococcal polysaccharide (PPSV23) vaccine. One dose is recommended after age 50. Talk to your health care provider about which screenings and vaccines you need and how often you need them. This information is not intended to replace advice given to you by your health care provider. Make sure you discuss any questions you have with your health care provider. Document Released: 03/27/2015 Document Revised: 11/18/2015 Document Reviewed: 12/30/2014 Elsevier Interactive Patient Education  2017 [place] Prevention in the Home Falls can cause injuries. They can happen to people of all ages. There are many things you can do to make your home safe and to help prevent falls. What can I do on the outside of my home?  Regularly fix the edges of walkways and driveways and fix any cracks.  Remove anything that might make you trip as you walk through a door, such as a raised step or threshold.  Trim any bushes or trees on the path to your home.  Use bright outdoor lighting.  Clear any walking paths of anything that might make someone trip, such as rocks or tools.  Regularly check to see if handrails are loose or broken. Make sure that both sides of any steps have handrails.  Any raised decks and porches should have guardrails on the edges.  Have any leaves, snow, or ice cleared regularly.  Use sand or salt on walking paths during winter.  Clean up any spills in your garage right away. This includes oil or grease spills. What can I do in the bathroom?  Use night lights.  Install grab bars by the toilet and in the tub and shower. Do not use towel bars as grab bars.  Use non-skid mats or decals in the tub or shower.  If you need to sit down in the shower, use a plastic, non-slip stool.  Keep the floor dry. Clean up any water that spills on the floor as soon as it happens.  Remove soap buildup in the tub or shower regularly.  Attach bath mats securely with double-sided  non-slip rug tape.  Do not have throw rugs and other things on the floor that can make you trip. What can I do in the bedroom?  Use night lights.  Make sure that you have a light by your bed that is easy to reach.  Do not use any sheets or blankets that are too big for your bed. They should not hang down onto the floor.  Have a firm chair that has side arms. You can use this for support while you get dressed.  Do not have throw rugs and other things on the floor that can make you trip. What can I do in the kitchen?  Clean up any spills right away.  Avoid walking on wet floors.  Keep items that you use a lot in easy-to-reach places.  If you need to reach something above you, use a strong step stool that has a grab bar.  Keep electrical cords out of the way.  Do not use floor polish or wax that makes floors slippery. If you must use wax, use non-skid floor wax.  Do not have throw rugs and other things on the floor that can make you trip.  What can I do with my stairs?  Do not leave any items on the stairs.  Make sure that there are handrails on both sides of the stairs and use them. Fix handrails that are broken or loose. Make sure that handrails are as long as the stairways.  Check any carpeting to make sure that it is firmly attached to the stairs. Fix any carpet that is loose or worn.  Avoid having throw rugs at the top or bottom of the stairs. If you do have throw rugs, attach them to the floor with carpet tape.  Make sure that you have a light switch at the top of the stairs and the bottom of the stairs. If you do not have them, ask someone to add them for you. What else can I do to help prevent falls?  Wear shoes that:  Do not have high heels.  Have rubber bottoms.  Are comfortable and fit you well.  Are closed at the toe. Do not wear sandals.  If you use a stepladder:  Make sure that it is fully opened. Do not climb a closed stepladder.  Make sure that both  sides of the stepladder are locked into place.  Ask someone to hold it for you, if possible.  Clearly mark and make sure that you can see:  Any grab bars or handrails.  First and last steps.  Where the edge of each step is.  Use tools that help you move around (mobility aids) if they are needed. These include:  Canes.  Walkers.  Scooters.  Crutches.  Turn on the lights when you go into a dark area. Replace any light bulbs as soon as they burn out.  Set up your furniture so you have a clear path. Avoid moving your furniture around.  If any of your floors are uneven, fix them.  If there are any pets around you, be aware of where they are.  Review your medicines with your doctor. Some medicines can make you feel dizzy. This can increase your chance of falling. Ask your doctor what other things that you can do to help prevent falls. This information is not intended to replace advice given to you by your health care provider. Make sure you discuss any questions you have with your health care provider. Document Released: 12/25/2008 Document Revised: 08/06/2015 Document Reviewed: 04/04/2014 Elsevier Interactive Patient Education  2017 [employer].

## 2017-10-04 ENCOUNTER — Encounter: Payer: Self-pay | Admitting: Family Medicine

## 2017-10-04 ENCOUNTER — Ambulatory Visit (INDEPENDENT_AMBULATORY_CARE_PROVIDER_SITE_OTHER): Payer: PPO | Admitting: Family Medicine

## 2017-10-04 VITALS — BP 122/64 | HR 60 | Temp 98.5°F | Resp 16 | Ht 68.0 in | Wt 174.6 lb

## 2017-10-04 DIAGNOSIS — K219 Gastro-esophageal reflux disease without esophagitis: Secondary | ICD-10-CM

## 2017-10-04 DIAGNOSIS — I10 Essential (primary) hypertension: Secondary | ICD-10-CM | POA: Diagnosis not present

## 2017-10-04 DIAGNOSIS — E7800 Pure hypercholesterolemia, unspecified: Secondary | ICD-10-CM | POA: Diagnosis not present

## 2017-10-04 DIAGNOSIS — Z0000 Encounter for general adult medical examination without abnormal findings: Secondary | ICD-10-CM | POA: Diagnosis not present

## 2017-10-04 NOTE — Progress Notes (Signed)
Patient: Jeffery Davis, Male    DOB: 03-21-1945, 72 y.o.   MRN: [id number] Visit Date: 10/04/2017  Today's Provider: [name], MD   Chief Complaint  Patient presents with  . Annual Wellness Exam   Subjective:  Patient saw Alyson Ingles on 10/03/2017.   Annual wellness visit Jeffery Davis is a 72 y.o. male. He feels well. He reports exercising not regularly, but he does stay active. He reports he is sleeping well.   Colonoscopy- 05/03/2017. Diverticulosis. Repeat in 5 yrs due to personal history of colon polyps.   Tdap- 10/06/2011.    Review of Systems  Constitutional: Negative.   HENT: Negative.   Eyes: Negative.   Respiratory: Negative.   Cardiovascular: Negative.   Gastrointestinal: Negative.   Endocrine: Negative.   Genitourinary: Negative.   Musculoskeletal: Negative.   Skin: Negative.   Allergic/Immunologic: Negative.   Neurological: Negative.        Chronic numbness of right foot from LS spine issue.  Hematological: Negative.   Psychiatric/Behavioral: Negative.     Social History   Socioeconomic History  . Marital status: Married    Spouse name: Coralyn Mark  . Number of children: 2  . Years of education: bachelors  . Highest education level: Bachelor's degree (e.g., BA, AB, BS)  Occupational History  . Occupation: Retired     Comment: worked at [employer] 12 years.     Employer: [employer]  Social Needs  . Financial resource strain: Not hard at all  . Food insecurity:    Worry: Never true    Inability: Never true  . Transportation needs:    Medical: No    Non-medical: No  Tobacco Use  . Smoking status: Never Smoker  . Smokeless tobacco: Never Used  Substance and Sexual Activity  . Alcohol use: Yes    Alcohol/week: 2.4 - 3.6 oz    Types: 4 - 6 Glasses of wine per week    Comment: 1-2 glasses of wine daily  . Drug use: No  . Sexual activity: Not on file  Lifestyle  . Physical activity:    Days per week: Not on  file    Minutes per session: Not on file  . Stress: Only a little  Relationships  . Social connections:    Talks on phone: Not on file    Gets together: Not on file    Attends religious service: Not on file    Active member of club or organization: Not on file    Attends meetings of clubs or organizations: Not on file    Relationship status: Not on file  . Intimate partner violence:    Fear of current or ex partner: Not on file    Emotionally abused: Not on file    Physically abused: Not on file    Forced sexual activity: Not on file  Other Topics Concern  . Not on file  Social History Narrative  . Not on file    Past Medical History:  Diagnosis Date  . Actinic keratosis   . Allergy   . Arthritis    degenerative  . Atrial fibrillation ([place])    s/p cardioversion 07/18/17  . Benign fibroma of prostate   . Carpal tunnel syndrome, right   . Erectile dysfunction   . GERD (gastroesophageal reflux disease)   . Hyperlipidemia   . Hypertension   . Lumbar herniated disc   . Lumbar stenosis with neurogenic claudication   .  LV dysfunction   . Neural foraminal stenosis of lumbosacral spine   . Primary localized osteoarthrosis of left shoulder 03/01/2016  . Wears hearing aid in both ears      Patient Active Problem List   Diagnosis Date Noted  . Neural foraminal stenosis of lumbosacral spine 08/03/2017  . Myocardiopathy ([place]) 07/03/2017  . Ataxia 06/13/2017  . Atrial fibrillation ([place]) 06/13/2017  . Lumbar stenosis with neurogenic claudication 01/09/2017  . Diastasis recti 11/17/2016  . Patellofemoral stress syndrome 07/21/2016  . Acquired trigger finger 07/21/2016  . Acute deep vein thrombosis (DVT) of proximal vein of left lower extremity ([place]) 03/24/2016  . Primary localized osteoarthrosis of left shoulder 03/01/2016  . S/P shoulder replacement 03/01/2016  . Medicare annual wellness visit, subsequent 09/29/2015  . Chest tightness 09/29/2015  . Fall with injury 09/29/2015   . Actinic keratoses 08/19/2014  . Allergic rhinitis 08/19/2014  . Benign fibroma of prostate 08/19/2014  . Acid indigestion 08/19/2014  . Failure of erection 08/19/2014  . Acid reflux 08/19/2014  . Hypercholesteremia 08/19/2014  . Arthritis, degenerative 08/19/2014    Past Surgical History:  Procedure Laterality Date  . CARDIAC CATHETERIZATION  2005   [place] N/A 07/18/2017   Procedure: CARDIOVERSION;  Surgeon: Teodoro Spray, MD;  Location: [facility] ORS;  Service: Cardiovascular;  Laterality: N/A;  . CATARACT EXTRACTION  06/2002  . COLONOSCOPY    . COLONOSCOPY WITH PROPOFOL N/A 05/03/2017   Procedure: COLONOSCOPY WITH PROPOFOL;  Surgeon: Manya Silvas, MD;  Location: [facility] ENDOSCOPY;  Service: Endoscopy;  Laterality: N/A;  . ESOPHAGOGASTRODUODENOSCOPY (EGD) WITH PROPOFOL N/A 01/08/2016   Procedure: ESOPHAGOGASTRODUODENOSCOPY (EGD) WITH PROPOFOL;  Surgeon: Manya Silvas, MD;  Location: [facility] ENDOSCOPY;  Service: Endoscopy;  Laterality: N/A;  . KNEE ARTHROSCOPY Right   . LUMBAR LAMINECTOMY/DECOMPRESSION MICRODISCECTOMY N/A 01/09/2017   Procedure: LAMINECTOMY AND FORAMINOTOMY LUMBAR THREE-FOUR LUMBAR FOUR-FIVE  LUMBAR FIVE SACRAL ONE;  Surgeon: Newman Pies, MD;  Location: [place];  Service: Neurosurgery;  Laterality: N/A;  . SHOULDER SURGERY Left 2010  . TOTAL SHOULDER ARTHROPLASTY Left 03/01/2016   Procedure: REVERSE TOTAL SHOULDER ARTHROPLASTY;  Surgeon: Marchia Bond, MD;  Location: [place];  Service: Orthopedics;  Laterality: Left;    His family history includes CAD in his father; Cancer in his mother; Healthy in his daughter and son; Hypertension in his father.      Current Outpatient Medications:  .  amiodarone (PACERONE) 200 MG tablet, Take 1 tablet by mouth daily., Disp: , Rfl:  .  atorvastatin (LIPITOR) 10 MG tablet, Take 1 tablet (10 mg total) by mouth every other day., Disp: 46 tablet, Rfl: 3 .  calcium carbonate (TUMS - DOSED IN MG ELEMENTAL  CALCIUM) 500 MG chewable tablet, Chew 2 tablets by mouth daily as needed for indigestion or heartburn., Disp: , Rfl:  .  Cholecalciferol (VITAMIN D3) 5000 units TABS, Take 5,000 Units by mouth daily., Disp: , Rfl:  .  Coenzyme Q10 (CO Q 10) 100 MG CAPS, Take 100 mg by mouth 2 (two) times daily. , Disp: , Rfl:  .  diphenhydramine-acetaminophen (TYLENOL PM) 25-500 MG TABS tablet, Take 2 tablets by mouth at bedtime as needed (sleep)., Disp: , Rfl:  .  ketoconazole (NIZORAL) 2 % cream, Apply 1 application topically every other day., Disp: , Rfl:  .  metoprolol tartrate (LOPRESSOR) 50 MG tablet, Take one tablet one hour prior to cardiac CT, Disp: 1 tablet, Rfl: 0 .  Misc Natural Products (GLUCOSAMINE CHONDROITIN TRIPLE) TABS, Take  1 tablet by mouth 2 (two) times daily., Disp: , Rfl:  .  omeprazole (PRILOSEC) 20 MG capsule, TAKE 1 CAPSULE BY MOUTH EVERY DAY (Patient taking differently: Take 20 mg by mouth every evening. ), Disp: 90 capsule, Rfl: 3 .  rivaroxaban (XARELTO) 20 MG TABS tablet, Take 20 mg by mouth daily with supper., Disp: , Rfl:  .  sildenafil (REVATIO) 20 MG tablet, Take 1 tablet (20 mg total) by mouth 3 (three) times daily as needed (ED)., Disp: 90 tablet, Rfl: 3 .  telmisartan (MICARDIS) 20 MG tablet, Take 20 mg by mouth daily. , Disp: , Rfl:   Patient Care Team: Jerrol Banana., MD as PCP - General (Family Medicine) Idelle Leech, OD as Consulting Physician (Optometry) Ralene Bathe, MD as Consulting Physician (Dermatology) Ubaldo Glassing Javier Docker, MD as Consulting Physician (Cardiology) Thompson Grayer, MD as Consulting Physician (Cardiology) Newman Pies, MD as Consulting Physician (Neurosurgery) Marchia Bond, MD as Consulting Physician (Orthopedic Surgery)     Objective:   Vitals: BP 122/64 (BP Location: Right Arm, Patient Position: Sitting, Cuff Size: Normal)   Pulse 60   Temp 98.5 F (36.9 C)   Resp 16   Ht 5\' 8"  (1.727 m)   Wt 174 lb 9.6 oz (79.2 kg)   SpO2 98%    BMI 26.55 kg/m   Physical Exam  Constitutional: He is oriented to person, place, and time. He appears well-developed and well-nourished.  HENT:  Head: Normocephalic and atraumatic.  Right Ear: External ear normal.  Left Ear: External ear normal.  Nose: Nose normal.  Eyes: Conjunctivae are normal. No scleral icterus.  Right pupil 1 mm smaller than left.  Neck: No thyromegaly present.  Cardiovascular: Normal rate, regular rhythm, normal heart sounds and intact distal pulses.  Pulmonary/Chest: Effort normal and breath sounds normal.  Abdominal: Soft.  Musculoskeletal: He exhibits no edema.  Lymphadenopathy:    He has no cervical adenopathy.  Neurological: He is alert and oriented to person, place, and time.  Skin: Skin is warm and dry.  Psychiatric: He has a normal mood and affect. His behavior is normal. Judgment and thought content normal.    Activities of Daily Living In your present state of health, do you have any difficulty performing the following activities: 10/03/2017 08/01/2017  Hearing? Y N  Comment Wears bilateral hearing aids.  -  Vision? N N  Difficulty concentrating or making decisions? N N  Walking or climbing stairs? N N  Dressing or bathing? N N  Doing errands, shopping? N N  Preparing Food and eating ? N -  Using the Toilet? N -  In the past six months, have you accidently leaked urine? N -  Do you have problems with loss of bowel control? N -  Managing your Medications? N -  Managing your Finances? N -  Housekeeping or managing your Housekeeping? N -  Some recent data might be hidden    Fall Risk Assessment Fall Risk  10/03/2017 09/27/2016 09/29/2015 11/10/2014  Falls in the past year? No No Yes No  Number falls in past yr: - - 1 -  Injury with Fall? - - Yes -  Comment - - fell on treadmill -  Risk for fall due to : - - History of fall(s) -  Follow up - - Falls evaluation completed -     Depression Screen PHQ 2/9 Scores 10/03/2017 10/03/2017 09/27/2016  09/27/2016  PHQ - 2 Score 0 0 0 0  PHQ- 9 Score  1 - 0 -    Cognitive Testing - 6-CIT Patient declined screening on 10/03/2017.      Assessment & Plan:     Annual Wellness Visit  Reviewed patient's Family Medical History Reviewed and updated list of patient's medical providers Assessment of cognitive impairment was done Assessed patient's functional ability Established a written schedule for health screening [place] Completed and Reviewed  Exercise Activities and Dietary recommendations Goals    . DIET - REDUCE PORTION SIZE     Recommend decreasing portion sizes by half and eating 3 small meals a day with two healthy snacks in between.        Immunization History  Administered Date(s) Administered  . Influenza, High Dose Seasonal PF 01/08/2015, 12/08/2015, 12/30/2016  . Influenza,inj,Quad PF,6+ Mos 01/07/2014  . Pneumococcal Conjugate-13 11/10/2014  . Pneumococcal Polysaccharide-23 10/06/2011  . Td 12/22/2003  . Tdap 10/06/2011  . Zoster 03/14/2010    Health Maintenance  Topic Date Due  . INFLUENZA VACCINE  10/12/2017  . COLONOSCOPY  05/03/2020  . TETANUS/TDAP  10/05/2021  . Hepatitis C Screening  Completed  . PNA vac Low Risk Adult  Completed     Discussed health benefits of physical activity, and encouraged him to engage in regular exercise appropriate for his age and condition.  AKs Per Dr Nehemiah Massed. Afib S/p Lumbar Laminectomy   I have done the exam and reviewed the above chart and it is accurate to the best of my knowledge. Development worker, community has been used in this note in any air is in the dictation or transcription are unintentional.  [name], MD  [place]

## 2017-10-05 ENCOUNTER — Telehealth: Payer: Self-pay

## 2017-10-05 DIAGNOSIS — E039 Hypothyroidism, unspecified: Secondary | ICD-10-CM

## 2017-10-05 LAB — LIPID PANEL
Chol/HDL Ratio: 3.1 ratio (ref 0.0–5.0)
Cholesterol, Total: 143 mg/dL (ref 100–199)
HDL: 46 mg/dL (ref >39)
LDL Calculated: 68 mg/dL (ref 0–99)
Triglycerides: 143 mg/dL (ref 0–149)
VLDL Cholesterol Cal: 29 mg/dL (ref 5–40)

## 2017-10-05 LAB — TSH: TSH: 8.8 u[IU]/mL — ABNORMAL HIGH (ref 0.450–4.500)

## 2017-10-05 MED ORDER — LEVOTHYROXINE SODIUM 25 MCG PO TABS: 25.0000 ug | ORAL_TABLET | Freq: Every day | ORAL | 5 refills | Status: DC

## 2017-10-05 NOTE — Telephone Encounter (Signed)
Advised patient of results. Medication to sent into the pharmacy.

## 2017-10-05 NOTE — Telephone Encounter (Signed)
-----   Message from Jerrol Banana., MD sent at 10/05/2017  9:54 AM EDT ----- Hypothyroid--start synthroid 25 mcg daily.

## 2017-10-10 ENCOUNTER — Ambulatory Visit (HOSPITAL_COMMUNITY): Payer: PPO

## 2017-10-10 ENCOUNTER — Telehealth: Payer: Self-pay | Admitting: Internal Medicine

## 2017-10-10 ENCOUNTER — Ambulatory Visit (HOSPITAL_COMMUNITY)
Admission: RE | Admit: 2017-10-10 | Discharge: 2017-10-10 | Disposition: A | Discharge status: Home / Self Care | Payer: PPO | Source: Ambulatory Visit | Attending: Internal Medicine | Admitting: Internal Medicine

## 2017-10-10 DIAGNOSIS — I700 Atherosclerosis of aorta: Secondary | ICD-10-CM | POA: Insufficient documentation

## 2017-10-10 DIAGNOSIS — I4891 Unspecified atrial fibrillation: Secondary | ICD-10-CM

## 2017-10-10 MED ORDER — IOPAMIDOL (ISOVUE-370) INJECTION 76%
INTRAVENOUS | Status: AC
  Administered 2017-10-10: 80 mL
  Filled 2017-10-10: qty 100

## 2017-10-10 NOTE — Telephone Encounter (Signed)
New Message:        Pt c/o medication issue:  1. Name of Medication: rivaroxaban (XARELTO) 20 MG TABS tablet  2. How are you currently taking this medication (dosage and times per day)? Take 20 mg by mouth daily with supper.  3. Are you having a reaction (difficulty breathing--STAT)? No  4. What is your medication issue? Pt states he was supposed to get a call back to let him know when he should stop taking this medication for his procedure on next week 10/17/17. Pt states he is also in sinus rhythm.

## 2017-10-10 NOTE — Telephone Encounter (Signed)
Call returned to Pt.  Advised Pt to continue Xarelto- advised not to miss any doses.  Advised he would only hold his AM medications the day of his procedure.  Advised Pt OK he is in SR.  Ablation can still be done.  Advised he would be notified if anything abnormal about his cardiac CT.  Pt indicates understanding.  No further action needed.

## 2017-10-17 ENCOUNTER — Other Ambulatory Visit: Payer: Self-pay

## 2017-10-17 ENCOUNTER — Encounter (HOSPITAL_COMMUNITY): Payer: Self-pay | Admitting: Certified Registered Nurse Anesthetist

## 2017-10-17 ENCOUNTER — Ambulatory Visit (HOSPITAL_COMMUNITY): Payer: PPO | Admitting: Anesthesiology

## 2017-10-17 ENCOUNTER — Encounter (HOSPITAL_COMMUNITY)
Admission: RE | Disposition: A | Discharge status: Home / Self Care | Payer: Self-pay | Source: Ambulatory Visit | Attending: Internal Medicine

## 2017-10-17 ENCOUNTER — Ambulatory Visit (HOSPITAL_COMMUNITY)
Admission: RE | Admit: 2017-10-17 | Discharge: 2017-10-18 | Disposition: A | Discharge status: Home / Self Care | Payer: PPO | Source: Ambulatory Visit | Attending: Internal Medicine | Admitting: Internal Medicine

## 2017-10-17 DIAGNOSIS — Z8249 Family history of ischemic heart disease and other diseases of the circulatory system: Secondary | ICD-10-CM | POA: Diagnosis not present

## 2017-10-17 DIAGNOSIS — Z79899 Other long term (current) drug therapy: Secondary | ICD-10-CM | POA: Insufficient documentation

## 2017-10-17 DIAGNOSIS — Z9889 Other specified postprocedural states: Secondary | ICD-10-CM | POA: Insufficient documentation

## 2017-10-17 DIAGNOSIS — Z96612 Presence of left artificial shoulder joint: Secondary | ICD-10-CM | POA: Insufficient documentation

## 2017-10-17 DIAGNOSIS — M1990 Unspecified osteoarthritis, unspecified site: Secondary | ICD-10-CM | POA: Diagnosis not present

## 2017-10-17 DIAGNOSIS — K219 Gastro-esophageal reflux disease without esophagitis: Secondary | ICD-10-CM | POA: Diagnosis not present

## 2017-10-17 DIAGNOSIS — Z809 Family history of malignant neoplasm, unspecified: Secondary | ICD-10-CM | POA: Insufficient documentation

## 2017-10-17 DIAGNOSIS — Z9849 Cataract extraction status, unspecified eye: Secondary | ICD-10-CM | POA: Diagnosis not present

## 2017-10-17 DIAGNOSIS — G5601 Carpal tunnel syndrome, right upper limb: Secondary | ICD-10-CM | POA: Insufficient documentation

## 2017-10-17 DIAGNOSIS — N529 Male erectile dysfunction, unspecified: Secondary | ICD-10-CM | POA: Diagnosis not present

## 2017-10-17 DIAGNOSIS — Z981 Arthrodesis status: Secondary | ICD-10-CM | POA: Diagnosis not present

## 2017-10-17 DIAGNOSIS — Z7901 Long term (current) use of anticoagulants: Secondary | ICD-10-CM | POA: Diagnosis not present

## 2017-10-17 DIAGNOSIS — I4891 Unspecified atrial fibrillation: Secondary | ICD-10-CM | POA: Diagnosis not present

## 2017-10-17 DIAGNOSIS — R9431 Abnormal electrocardiogram [ECG] [EKG]: Secondary | ICD-10-CM | POA: Insufficient documentation

## 2017-10-17 DIAGNOSIS — I10 Essential (primary) hypertension: Secondary | ICD-10-CM | POA: Insufficient documentation

## 2017-10-17 DIAGNOSIS — D291 Benign neoplasm of prostate: Secondary | ICD-10-CM | POA: Diagnosis not present

## 2017-10-17 DIAGNOSIS — M4807 Spinal stenosis, lumbosacral region: Secondary | ICD-10-CM | POA: Insufficient documentation

## 2017-10-17 DIAGNOSIS — I481 Persistent atrial fibrillation: Secondary | ICD-10-CM | POA: Diagnosis not present

## 2017-10-17 DIAGNOSIS — I4819 Other persistent atrial fibrillation: Secondary | ICD-10-CM | POA: Diagnosis present

## 2017-10-17 DIAGNOSIS — I483 Typical atrial flutter: Secondary | ICD-10-CM | POA: Diagnosis not present

## 2017-10-17 DIAGNOSIS — M48062 Spinal stenosis, lumbar region with neurogenic claudication: Secondary | ICD-10-CM | POA: Diagnosis not present

## 2017-10-17 HISTORY — PX: ATRIAL FIBRILLATION ABLATION: EP1191

## 2017-10-17 HISTORY — PX: ABLATION OF DYSRHYTHMIC FOCUS: SHX254

## 2017-10-17 LAB — POCT ACTIVATED CLOTTING TIME
Activated Clotting Time: 246 seconds
Activated Clotting Time: 263 seconds
Activated Clotting Time: 313 seconds
Activated Clotting Time: 202 seconds
Activated Clotting Time: 335 seconds
Activated Clotting Time: 197 seconds

## 2017-10-17 LAB — GLUCOSE, CAPILLARY: Glucose-Capillary: 179 mg/dL — ABNORMAL HIGH (ref 70–99)

## 2017-10-17 SURGERY — ATRIAL FIBRILLATION ABLATION
Anesthesia: General

## 2017-10-17 MED ORDER — HEPARIN SODIUM (PORCINE) 1000 UNIT/ML IJ SOLN
INTRAMUSCULAR | Status: AC
  Filled 2017-10-17: qty 1

## 2017-10-17 MED ORDER — SODIUM CHLORIDE 0.9 % IV SOLN
INTRAVENOUS | Status: DC
  Administered 2017-10-17 (×2): via INTRAVENOUS

## 2017-10-17 MED ORDER — PROPOFOL 10 MG/ML IV BOLUS
INTRAVENOUS | Status: DC | PRN
  Administered 2017-10-17: 150 mg via INTRAVENOUS

## 2017-10-17 MED ORDER — MIDAZOLAM HCL 5 MG/5ML IJ SOLN
INTRAMUSCULAR | Status: DC | PRN
  Administered 2017-10-17 (×2): 1 mg via INTRAVENOUS

## 2017-10-17 MED ORDER — HYDROCODONE-ACETAMINOPHEN 5-325 MG PO TABS
1.0000 | ORAL_TABLET | ORAL | Status: DC | PRN
Start: 2017-10-17 — End: 2017-10-18
  Administered 2017-10-17 (×2): 1 via ORAL
  Filled 2017-10-17: qty 1

## 2017-10-17 MED ORDER — SODIUM CHLORIDE 0.9% FLUSH: 3.0000 mL | INTRAVENOUS | Status: DC | PRN

## 2017-10-17 MED ORDER — ONDANSETRON HCL 4 MG/2ML IJ SOLN: 4.0000 mg | Freq: Once | INTRAMUSCULAR | Status: DC | PRN

## 2017-10-17 MED ORDER — FENTANYL CITRATE (PF) 100 MCG/2ML IJ SOLN: 25.0000 ug | INTRAMUSCULAR | Status: DC | PRN

## 2017-10-17 MED ORDER — ISOPROTERENOL HCL 0.2 MG/ML IJ SOLN
INTRAMUSCULAR | Status: AC
  Filled 2017-10-17: qty 5

## 2017-10-17 MED ORDER — DIPHENHYDRAMINE HCL 25 MG PO CAPS
50.0000 mg | ORAL_CAPSULE | Freq: Every evening | ORAL | Status: DC | PRN
  Administered 2017-10-17: 50 mg via ORAL
  Filled 2017-10-17: qty 2

## 2017-10-17 MED ORDER — EPHEDRINE SULFATE 50 MG/ML IJ SOLN
INTRAMUSCULAR | Status: DC | PRN
  Administered 2017-10-17 (×3): 5 mg via INTRAVENOUS

## 2017-10-17 MED ORDER — IOPAMIDOL (ISOVUE-370) INJECTION 76%
INTRAVENOUS | Status: AC
  Filled 2017-10-17: qty 50

## 2017-10-17 MED ORDER — SUGAMMADEX SODIUM 200 MG/2ML IV SOLN
INTRAVENOUS | Status: DC | PRN
  Administered 2017-10-17: 200 mg via INTRAVENOUS

## 2017-10-17 MED ORDER — DEXAMETHASONE SODIUM PHOSPHATE 10 MG/ML IJ SOLN
INTRAMUSCULAR | Status: DC | PRN
  Administered 2017-10-17: 4 mg via INTRAVENOUS

## 2017-10-17 MED ORDER — ACETAMINOPHEN 500 MG PO TABS: 1000.0000 mg | ORAL_TABLET | Freq: Every evening | ORAL | Status: DC | PRN

## 2017-10-17 MED ORDER — IRBESARTAN 75 MG PO TABS
75.0000 mg | ORAL_TABLET | Freq: Every day | ORAL | Status: DC
  Administered 2017-10-17 – 2017-10-18 (×2): 75 mg via ORAL
  Filled 2017-10-17 (×2): qty 1

## 2017-10-17 MED ORDER — PROTAMINE SULFATE 10 MG/ML IV SOLN
INTRAVENOUS | Status: DC | PRN
  Administered 2017-10-17: 40 mg via INTRAVENOUS

## 2017-10-17 MED ORDER — HEPARIN (PORCINE) IN NACL 1000-0.9 UT/500ML-% IV SOLN
INTRAVENOUS | Status: AC
  Filled 2017-10-17: qty 500

## 2017-10-17 MED ORDER — RIVAROXABAN 20 MG PO TABS
20.0000 mg | ORAL_TABLET | Freq: Every day | ORAL | Status: DC
  Administered 2017-10-17: 20 mg via ORAL
  Filled 2017-10-17: qty 1

## 2017-10-17 MED ORDER — BUPIVACAINE HCL (PF) 0.25 % IJ SOLN
INTRAMUSCULAR | Status: DC | PRN
  Administered 2017-10-17: 30 mL

## 2017-10-17 MED ORDER — ISOPROTERENOL HCL 0.2 MG/ML IJ SOLN
INTRAMUSCULAR | Status: DC | PRN
  Administered 2017-10-17: 20 ug/min via INTRAVENOUS

## 2017-10-17 MED ORDER — SODIUM CHLORIDE 0.9 % IV SOLN: 250.0000 mL | INTRAVENOUS | Status: DC | PRN

## 2017-10-17 MED ORDER — LEVOTHYROXINE SODIUM 25 MCG PO TABS
25.0000 ug | ORAL_TABLET | Freq: Every day | ORAL | Status: DC
  Administered 2017-10-18: 25 ug via ORAL
  Filled 2017-10-17: qty 1

## 2017-10-17 MED ORDER — FENTANYL CITRATE (PF) 100 MCG/2ML IJ SOLN
INTRAMUSCULAR | Status: DC | PRN
  Administered 2017-10-17: 100 ug via INTRAVENOUS

## 2017-10-17 MED ORDER — SODIUM CHLORIDE 0.9% FLUSH
3.0000 mL | Freq: Two times a day (BID) | INTRAVENOUS | Status: DC
  Administered 2017-10-17 – 2017-10-18 (×2): 3 mL via INTRAVENOUS

## 2017-10-17 MED ORDER — DIPHENHYDRAMINE-APAP (SLEEP) 25-500 MG PO TABS: 2.0000 | ORAL_TABLET | Freq: Every evening | ORAL | Status: DC | PRN

## 2017-10-17 MED ORDER — LIDOCAINE HCL (CARDIAC) PF 100 MG/5ML IV SOSY
PREFILLED_SYRINGE | INTRAVENOUS | Status: DC | PRN
  Administered 2017-10-17: 60 mg via INTRAVENOUS

## 2017-10-17 MED ORDER — ACETAMINOPHEN 325 MG PO TABS: 650.0000 mg | ORAL_TABLET | ORAL | Status: DC | PRN

## 2017-10-17 MED ORDER — SODIUM CHLORIDE 0.9 % IV SOLN
INTRAVENOUS | Status: DC | PRN
  Administered 2017-10-17: 25 ug/min via INTRAVENOUS

## 2017-10-17 MED ORDER — ROCURONIUM BROMIDE 100 MG/10ML IV SOLN
INTRAVENOUS | Status: DC | PRN
  Administered 2017-10-17: 20 mg via INTRAVENOUS
  Administered 2017-10-17: 50 mg via INTRAVENOUS
  Administered 2017-10-17 (×3): 20 mg via INTRAVENOUS
  Administered 2017-10-17: 10 mg via INTRAVENOUS

## 2017-10-17 MED ORDER — HEPARIN (PORCINE) IN NACL 1000-0.9 UT/500ML-% IV SOLN
INTRAVENOUS | Status: DC | PRN
  Administered 2017-10-17: 500 mL

## 2017-10-17 MED ORDER — IOPAMIDOL (ISOVUE-370) INJECTION 76%
INTRAVENOUS | Status: DC | PRN
  Administered 2017-10-17: 3 mL via INTRAVENOUS

## 2017-10-17 MED ORDER — HEPARIN SODIUM (PORCINE) 1000 UNIT/ML IJ SOLN
INTRAMUSCULAR | Status: DC | PRN
  Administered 2017-10-17: 4000 [IU] via INTRAVENOUS
  Administered 2017-10-17: 5000 [IU] via INTRAVENOUS
  Administered 2017-10-17: 2000 [IU] via INTRAVENOUS
  Administered 2017-10-17: 3000 [IU] via INTRAVENOUS
  Administered 2017-10-17: 12000 [IU] via INTRAVENOUS

## 2017-10-17 MED ORDER — ONDANSETRON HCL 4 MG/2ML IJ SOLN: 4.0000 mg | Freq: Four times a day (QID) | INTRAMUSCULAR | Status: DC | PRN

## 2017-10-17 MED ORDER — ONDANSETRON HCL 4 MG/2ML IJ SOLN
INTRAMUSCULAR | Status: DC | PRN
  Administered 2017-10-17: 4 mg via INTRAVENOUS

## 2017-10-17 MED ORDER — HYDROCODONE-ACETAMINOPHEN 5-325 MG PO TABS
ORAL_TABLET | ORAL | Status: AC
  Filled 2017-10-17: qty 1

## 2017-10-17 MED ORDER — BUPIVACAINE HCL (PF) 0.25 % IJ SOLN
INTRAMUSCULAR | Status: AC
  Filled 2017-10-17: qty 30

## 2017-10-17 MED ORDER — HEPARIN SODIUM (PORCINE) 1000 UNIT/ML IJ SOLN
INTRAMUSCULAR | Status: DC | PRN
  Administered 2017-10-17 (×2): 1000 [IU] via INTRAVENOUS
  Administered 2017-10-17: 12000 [IU] via INTRAVENOUS

## 2017-10-17 SURGICAL SUPPLY — 16 items
CATH MAPPNG PENTARAY F 2-6-2MM (CATHETERS) ×1 IMPLANT
CATH NAVISTAR SMARTTOUCH DF (ABLATOR) ×2 IMPLANT
CATH SOUNDSTAR 3D IMAGING (CATHETERS) ×2 IMPLANT
CATH WEBSTER BI DIR CS D-F CRV (CATHETERS) ×2 IMPLANT
COVER SWIFTLINK CONNECTOR (BAG) ×6 IMPLANT
NEEDLE BAYLIS TRANSSEPTAL 71CM (NEEDLE) ×2 IMPLANT
PACK EP LATEX FREE (CUSTOM PROCEDURE TRAY) ×1
PACK EP LF (CUSTOM PROCEDURE TRAY) ×1 IMPLANT
PAD DEFIB LIFELINK (PAD) ×2 IMPLANT
PATCH CARTO3 (PAD) ×2 IMPLANT
PENTARAY F 2-6-2MM (CATHETERS) ×2
SHEATH AVANTI 11F 11CM (SHEATH) ×2 IMPLANT
SHEATH PINNACLE 7F 10CM (SHEATH) ×4 IMPLANT
SHEATH PINNACLE 9F 10CM (SHEATH) ×2 IMPLANT
SHEATH SWARTZ TS SL2 63CM 8.5F (SHEATH) ×2 IMPLANT
TUBING SMART ABLATE COOLFLOW (TUBING) ×2 IMPLANT

## 2017-10-17 NOTE — Progress Notes (Signed)
Site area: 3 right fv sheaths Site Prior to Removal:  Level 0 Pressure Applied For: 20 minutes Manual:   yes Patient Status During Pull:  stable Post Pull Site:  Level 0 Post Pull Instructions Given:  yes Post Pull Pulses Present: rt dp palpable Dressing Applied:  Gauze and tegaderm Bedrest begins @ 8466 Comments:  IV saline locked

## 2017-10-17 NOTE — Transfer of Care (Signed)
Immediate Anesthesia Transfer of Care Note  Patient: Jeffery Davis  Procedure(s) Performed: ATRIAL FIBRILLATION ABLATION (N/A )  Patient Location: PACU  Anesthesia Type:General  Level of Consciousness: awake and alert   Airway & Oxygen Therapy: Patient Spontanous Breathing and Patient connected to nasal cannula oxygen  Post-op Assessment: Report given to RN and Post -op Vital signs reviewed and stable  Post vital signs: Reviewed and stable  Last Vitals:  Vitals Value Taken Time  BP 156/71 10/17/2017  1:09 PM  Temp 36.4 C 10/17/2017  1:08 PM  Pulse 67 10/17/2017  1:10 PM  Resp 20 10/17/2017  1:10 PM  SpO2 98 % 10/17/2017  1:10 PM  Vitals shown include unvalidated device data.  Last Pain:  Vitals:   10/17/17 1308  TempSrc: Temporal  PainSc: 0-No pain         Complications: No apparent anesthesia complications

## 2017-10-17 NOTE — Discharge Instructions (Signed)
Post procedure care instructions No driving for 4 days. No lifting over 5 lbs for 1 week. No vigorous or sexual activity for 1 week. You may return to work on 10/24/17. Keep procedure site clean & dry. If you notice increased pain, swelling, bleeding or pus, call/return!  You may shower, but no soaking baths/hot tubs/pools for 1 week.    You have an appointment set up with the [facility].  Multiple studies have shown that being followed by a dedicated atrial fibrillation clinic in addition to the standard care you receive from your other physicians improves health. We believe that enrollment in the atrial fibrillation clinic will allow [place] to better care for you.   The phone number to the [facility] is [phone]. The clinic is staffed Monday through Friday from 8:30am to 5pm.  Parking Directions: The clinic is located in the Heart and Vascular Building connected to [facility]. 1)From [address] turn on to [address] and go to the 3rd entrance  (Heart and Vascular entrance) on the right. 2)Look to the right for Heart &Vascular Parking Garage. 3)A code for the entrance is required please call the clinic to receive this.   4)Take the elevators to the 1st floor. Registration is in the room with the glass walls at the end of the hallway.  If you have any trouble parking or locating the clinic, please dont hesitate to call [phone].

## 2017-10-17 NOTE — H&P (Signed)
CC: afib   History of Present Illness: Jeffery Davis is a 72 y.o. adult who presents today for electrophysiology study and ablation for atrial fibrillation.    He was initially diagnosed with atrial fibrillation April 1st after presented to Dr Alben Spittle office to follow-up on symptoms of dizziness, presyncope and ataxia.  Of note, the week prior, he had a preoperative ekg which was "normal".  Upon being diagnosed with atrial fibrllation, he was referred to Dr Ubaldo Glassing. He was placed on anticoagulation and cardioverted.  He converted to sinus rhythm but eventually returned to afib.  He reports SOB and fatigue with his afib.  He has been placed on amiodarone but has not improved.  Today, he is in atrial flutter. He has been found on echo to have EF of 40%.  myoview was unrevealing.  There is concern that his reduced EF may be tachycardia mediated. Today, he denies symptoms of palpitations, chest pain, orthopnea, PND, lower extremity edema, claudication, dizziness, presyncope, syncope, bleeding, or neurologic sequela. The patient is tolerating medications without difficulties and is otherwise without complaint today.        Past Medical History:  Diagnosis Date  . Actinic keratosis   . Allergy   . Arthritis    degenerative  . Atrial fibrillation ([place])    s/p cardioversion 07/18/17  . Benign fibroma of prostate   . Carpal tunnel syndrome, right   . Erectile dysfunction   . GERD (gastroesophageal reflux disease)   . Hypertension   . Lumbar herniated disc   . Lumbar stenosis with neurogenic claudication   . LV dysfunction   . Neural foraminal stenosis of lumbosacral spine   . Primary localized osteoarthrosis of left shoulder 03/01/2016  . Wears hearing aid in both ears         Past Surgical History:  Procedure Laterality Date  . CARDIAC CATHETERIZATION  2005   [place] N/A 07/18/2017   Procedure: CARDIOVERSION;  Surgeon: Teodoro Spray, MD;   Location: [facility] ORS;  Service: Cardiovascular;  Laterality: N/A;  . CATARACT EXTRACTION  06/2002  . COLONOSCOPY    . COLONOSCOPY WITH PROPOFOL N/A 05/03/2017   Procedure: COLONOSCOPY WITH PROPOFOL;  Surgeon: Manya Silvas, MD;  Location: [facility] ENDOSCOPY;  Service: Endoscopy;  Laterality: N/A;  . ESOPHAGOGASTRODUODENOSCOPY (EGD) WITH PROPOFOL N/A 01/08/2016   Procedure: ESOPHAGOGASTRODUODENOSCOPY (EGD) WITH PROPOFOL;  Surgeon: Manya Silvas, MD;  Location: [facility] ENDOSCOPY;  Service: Endoscopy;  Laterality: N/A;  . KNEE ARTHROSCOPY Right   . LUMBAR LAMINECTOMY/DECOMPRESSION MICRODISCECTOMY N/A 01/09/2017   Procedure: LAMINECTOMY AND FORAMINOTOMY LUMBAR THREE-FOUR LUMBAR FOUR-FIVE  LUMBAR FIVE SACRAL ONE;  Surgeon: Newman Pies, MD;  Location: [place];  Service: Neurosurgery;  Laterality: N/A;  . SHOULDER SURGERY Left 2010  . TOTAL SHOULDER ARTHROPLASTY Left 03/01/2016   Procedure: REVERSE TOTAL SHOULDER ARTHROPLASTY;  Surgeon: Marchia Bond, MD;  Location: [place];  Service: Orthopedics;  Laterality: Left;           Current Outpatient Medications  Medication Sig Dispense Refill  . amiodarone (PACERONE) 200 MG tablet Take 1 tablet by mouth daily.    Marland Kitchen atorvastatin (LIPITOR) 10 MG tablet Take 1 tablet (10 mg total) by mouth every other day. 46 tablet 3  . calcium carbonate (TUMS - DOSED IN MG ELEMENTAL CALCIUM) 500 MG chewable tablet Chew 2 tablets by mouth daily as needed for indigestion or heartburn.    . Cholecalciferol (VITAMIN D3) 5000 units TABS Take 5,000 Units by mouth daily.    Marland Kitchen  Coenzyme Q10 (CO Q 10) 100 MG CAPS Take 100 mg by mouth 2 (two) times daily.     . diphenhydramine-acetaminophen (TYLENOL PM) 25-500 MG TABS tablet Take 2 tablets by mouth at bedtime as needed (sleep).    Marland Kitchen ketoconazole (NIZORAL) 2 % cream Apply 1 application topically every other day.    . Misc Natural Products (GLUCOSAMINE CHONDROITIN TRIPLE) TABS Take 1 tablet by mouth 2 (two) times  daily.    Marland Kitchen omeprazole (PRILOSEC) 20 MG capsule TAKE 1 CAPSULE BY MOUTH EVERY DAY (Patient taking differently: Take 20 mg by mouth every evening. ) 90 capsule 3  . rivaroxaban (XARELTO) 20 MG TABS tablet Take 20 mg by mouth daily with supper.    . sildenafil (REVATIO) 20 MG tablet Take 1 tablet (20 mg total) by mouth 3 (three) times daily as needed (ED). 90 tablet 3   No current facility-administered medications for this visit.     Allergies:   Patient has no known allergies.   Social History:  The patient  reports that he has never smoked. He has never used smokeless tobacco. He reports that he drinks about 3.0 - 4.2 oz of alcohol per week. He reports that he does not use drugs.   Family History:  The patient's  family history includes CAD in his father; Cancer in his mother; Healthy in his daughter and son; Hypertension in his father.    ROS:  Please see the history of present illness.   All other systems are personally reviewed and negative.    PHYSICAL EXAM: Vitals:   10/17/17 0737  BP: (!) 170/85  Pulse: (!) 56  Temp: 97.8 F (36.6 C)  SpO2: 100%    GEN: Well nourished, well developed, in no acute distress  HEENT: normal  Neck: no JVD, carotid bruits, or masses Cardiac: iRRR; no murmurs, rubs, or gallops,no edema  Respiratory:  clear to auscultation bilaterally, normal work of breathing GI: soft, nontender, nondistended, + BS MS: no deformity or atrophy  Skin: warm and dry  Neuro:  Strength and sensation are intact Psych: euthymic mood, full affect    Recent Labs: 01/03/2017: ALT 20 08/04/2017: BUN 14; Creatinine, Ser 1.02; Hemoglobin 10.6; Platelets 222; Potassium 3.9; Sodium 138  personally reviewed   Lipid Panel  Labs(Brief)          Component Value Date/Time   CHOL 166 08/25/2016 0945   CHOL 173 09/30/2011 0740   TRIG 98 08/25/2016 0945   TRIG 375 (H) 09/30/2011 0740   HDL 50 08/25/2016 0945   HDL 41 09/30/2011 0740   CHOLHDL  2.8 09/29/2015 1119   VLDL 75 (H) 09/30/2011 0740   LDLCALC 96 08/25/2016 0945   LDLCALC 57 09/30/2011 0740     personally reviewed      Wt Readings from Last 3 Encounters:  09/06/17 173 lb (78.5 kg)  08/31/17 174 lb (78.9 kg)  08/03/17 173 lb 3.2 oz (78.6 kg)      ASSESSMENT AND PLAN:  1.  Persistent afib/ typical atrial flutter Therapeutic strategies for afib and atrial flutter including medicine and ablation were discussed in detail with the patient today. Risk, benefits, and alternatives to EP study and radiofrequency ablation were also discussed in detail today. These risks include but are not limited to stroke, bleeding, vascular damage, tamponade, perforation, damage to the esophagus, lungs, and other structures, pulmonary vein stenosis, worsening renal function, and death. The patient understands these risk and wishes to proceed. Cardiac CT reviewed with patient.  He reports compliance with xarelto without interuption.  Thompson Grayer MD, [facility] 10/17/2017 9:28 AM

## 2017-10-17 NOTE — Anesthesia Procedure Notes (Signed)
Procedure Name: Intubation Date/Time: 10/17/2017 9:45 AM Performed by: Inda Coke, CRNA Pre-anesthesia Checklist: Patient identified, Emergency Drugs available, Suction available and Patient being monitored Patient Re-evaluated:Patient Re-evaluated prior to induction Oxygen Delivery Method: Circle System Utilized Preoxygenation: Pre-oxygenation with 100% oxygen Induction Type: IV induction Ventilation: Mask ventilation without difficulty Laryngoscope Size: Mac and 4 Grade View: Grade I Tube type: Oral Number of attempts: 1 Airway Equipment and Method: Stylet and Oral airway Placement Confirmation: ETT inserted through vocal cords under direct vision,  positive ETCO2 and breath sounds checked- equal and bilateral Secured at: 21 cm Tube secured with: Tape Dental Injury: Teeth and Oropharynx as per pre-operative assessment

## 2017-10-17 NOTE — Anesthesia Postprocedure Evaluation (Signed)
Anesthesia Post Note  Patient: Jeffery Davis  Procedure(s) Performed: ATRIAL FIBRILLATION ABLATION (N/A )     Patient location during evaluation: PACU Anesthesia Type: General Level of consciousness: awake and alert and oriented Pain management: pain level controlled Vital Signs Assessment: post-procedure vital signs reviewed and stable Respiratory status: spontaneous breathing, nonlabored ventilation, respiratory function stable and patient connected to nasal cannula oxygen Cardiovascular status: blood pressure returned to baseline and stable Postop Assessment: no apparent nausea or vomiting Anesthetic complications: no    Last Vitals:  Vitals:   10/17/17 1336 10/17/17 1345  BP:  132/66  Pulse:  64  Resp:  16  Temp: (!) 36.2 C   SpO2:  98%    Last Pain:  Vitals:   10/17/17 1336  TempSrc: Temporal  PainSc:                  [name]

## 2017-10-17 NOTE — Anesthesia Preprocedure Evaluation (Addendum)
Anesthesia Evaluation  Patient identified by MRN, date of birth, ID band Patient awake    Reviewed: Allergy & Precautions, NPO status , Patient's Chart, lab work & pertinent test results  Airway Mallampati: I  TM Distance: >3 FB Neck ROM: Full    Dental  (+) Teeth Intact, Caps, Partial Lower,    Pulmonary neg pulmonary ROS,    Pulmonary exam normal breath sounds clear to auscultation       Cardiovascular hypertension, Pt. on medications + Peripheral Vascular Disease  Normal cardiovascular exam Rhythm:Regular Rate:Normal  Cardiomyopathy non ischemic LVEF 40%   Neuro/Psych  Neuromuscular disease negative neurological ROS  negative psych ROS   GI/Hepatic Neg liver ROS, GERD  Medicated and Controlled,  Endo/Other  Hyperlipidemia  Renal/GU negative Renal ROS   ED    Musculoskeletal  (+) Arthritis , Osteoarthritis,    Abdominal   Peds  Hematology Xarelto - last dose 6pm   Anesthesia Other Findings   Reproductive/Obstetrics                            Anesthesia Physical Anesthesia Plan  ASA: II  Anesthesia Plan: General   Post-op Pain Management:    Induction: Intravenous  PONV Risk Score and Plan: 2 and Ondansetron and Treatment may vary due to age or medical condition  Airway Management Planned: Oral ETT  Additional Equipment:   Intra-op Plan:   Post-operative Plan: Extubation in OR  Informed Consent: I have reviewed the patients History and Physical, chart, labs and discussed the procedure including the risks, benefits and alternatives for the proposed anesthesia with the patient or authorized representative who has indicated his/her understanding and acceptance.   Dental advisory given  Plan Discussed with: CRNA and Surgeon  Anesthesia Plan Comments:        Anesthesia Quick Evaluation

## 2017-10-17 NOTE — Progress Notes (Signed)
ACT 197. Spoke with Dr. Rayann Heman; ok to pull sheaths.

## 2017-10-18 ENCOUNTER — Encounter (HOSPITAL_COMMUNITY): Payer: Self-pay | Admitting: Internal Medicine

## 2017-10-18 DIAGNOSIS — R9431 Abnormal electrocardiogram [ECG] [EKG]: Secondary | ICD-10-CM | POA: Diagnosis not present

## 2017-10-18 DIAGNOSIS — I10 Essential (primary) hypertension: Secondary | ICD-10-CM | POA: Diagnosis not present

## 2017-10-18 DIAGNOSIS — M4807 Spinal stenosis, lumbosacral region: Secondary | ICD-10-CM | POA: Diagnosis not present

## 2017-10-18 DIAGNOSIS — M48062 Spinal stenosis, lumbar region with neurogenic claudication: Secondary | ICD-10-CM | POA: Diagnosis not present

## 2017-10-18 DIAGNOSIS — I481 Persistent atrial fibrillation: Secondary | ICD-10-CM | POA: Diagnosis not present

## 2017-10-18 DIAGNOSIS — K219 Gastro-esophageal reflux disease without esophagitis: Secondary | ICD-10-CM | POA: Diagnosis not present

## 2017-10-18 DIAGNOSIS — G5601 Carpal tunnel syndrome, right upper limb: Secondary | ICD-10-CM | POA: Diagnosis not present

## 2017-10-18 DIAGNOSIS — Z7901 Long term (current) use of anticoagulants: Secondary | ICD-10-CM | POA: Diagnosis not present

## 2017-10-18 DIAGNOSIS — N529 Male erectile dysfunction, unspecified: Secondary | ICD-10-CM | POA: Diagnosis not present

## 2017-10-18 DIAGNOSIS — M1990 Unspecified osteoarthritis, unspecified site: Secondary | ICD-10-CM | POA: Diagnosis not present

## 2017-10-18 DIAGNOSIS — D291 Benign neoplasm of prostate: Secondary | ICD-10-CM | POA: Diagnosis not present

## 2017-10-18 DIAGNOSIS — I483 Typical atrial flutter: Secondary | ICD-10-CM | POA: Diagnosis not present

## 2017-10-18 NOTE — Progress Notes (Signed)
Discharge instruction was given to pt and family.  Minnah Llamas, RN 

## 2017-10-18 NOTE — Discharge Summary (Addendum)
ELECTROPHYSIOLOGY PROCEDURE DISCHARGE SUMMARY    Patient ID: Jeffery Davis,  MRN: [id number], DOB/AGE: 72-May-1947 72 y.o.  Admit date: 10/17/2017 Discharge date: 10/18/2017  Primary Care Physician: Jerrol Banana., MD Primary Cardiologist: Dr. Ubaldo Glassing Electrophysiologist: Thompson Grayer, MD  Primary Discharge Diagnosis:  1. Persistent AFib     CHA2DSVasc is at least 3, on Xarelto       Secondary Discharge Diagnosis:  1. HTN 2. NICM  Procedures This Admission:  1.  Electrophysiology study and radiofrequency catheter ablation on 10/17/17 by Dr Thompson Grayer.   This study demonstrated    Brief HPI: Jeffery Davis is a 72 y.o. male with a history of Persistent atrial fibrillation.  He has failed medical therapy with amiodarone. Risks, benefits, and alternatives to catheter ablation of atrial fibrillation were reviewed with the patient who wished to proceed.  The patient underwent cardiac CT prior to the procedure which demonstrated no LAA thrombus.    Hospital Course:  The patient was admitted and underwent EPS/RFCA of atrial fibrillation with details as outlined above.  They were monitored on telemetry overnight which demonstrated SR.  R groin was without complication on the day of discharge.  The patient feels well this morning, no CP or SOB, he was examined by Dr. Rayann Heman and considered to be stable for discharge.  Wound care and restrictions were reviewed with the patient.  The patient will be seen back by Roderic Palau, NP in 4 weeks and Dr Rayann Heman in 12 weeks for post ablation follow up.   Physical Exam: Vitals:   10/17/17 1707 10/17/17 1721 10/17/17 2142 10/18/17 0443  BP: 135/74 132/85 115/71 117/68  Pulse:   66 62  Resp: (!) 27 (!) 25 19 20   Temp:   98.4 F (36.9 C) 98.2 F (36.8 C)  TempSrc:   Oral Oral  SpO2:   98% 97%  Weight:    174 lb 14.4 oz (79.3 kg)  Height:       The patient is seen and examined by Dr. Curtis Sites- The patient is well appearing, alert and  oriented x 3 today.   HEENT: normocephalic, atraumatic; sclera clear, conjunctiva pink; hearing intact; oropharynx clear; neck supple  Lungs- CTA b/l, normal work of breathing.  No wheezes, rales, rhonchi Heart- RRR, no murmurs, rubs or gallops  GI- soft, non-tender, non-distended  Extremities- no clubbing, cyanosis, or edema; R groin without hematoma/bruit MS- no significant deformity or atrophy Skin- warm and dry, no rash or lesion Psych- euthymic mood, full affect Neuro- strength and sensation are intact   Labs:   Lab Results  Component Value Date   WBC 6.0 09/18/2017   HGB 12.4 (L) 09/18/2017   HCT 38.8 09/18/2017   MCV 88 09/18/2017   PLT 403 09/18/2017   No results for input(s): NA, K, CL, CO2, BUN, CREATININE, CALCIUM, PROT, BILITOT, ALKPHOS, ALT, AST, GLUCOSE in the last 168 hours.  Invalid input(s): LABALBU   Discharge Medications:  Allergies as of 10/18/2017   No Known Allergies     Medication List    STOP taking these medications   amiodarone 200 MG tablet Commonly known as:  PACERONE   metoprolol tartrate 50 MG tablet Commonly known as:  LOPRESSOR     TAKE these medications   atorvastatin 10 MG tablet Commonly known as:  LIPITOR Take 1 tablet (10 mg total) by mouth every other day.   Co Q 10 100 MG Caps Take 100 mg by mouth 2 (two)  times daily.   diphenhydramine-acetaminophen 25-500 MG Tabs tablet Commonly known as:  TYLENOL PM Take 2 tablets by mouth at bedtime as needed (sleep).   Glucosamine Chondroitin Triple Tabs Take 1 tablet by mouth 2 (two) times daily.   ketoconazole 2 % cream Commonly known as:  NIZORAL Apply 1 application topically every other day.   levothyroxine 25 MCG tablet Commonly known as:  SYNTHROID Take 1 tablet (25 mcg total) by mouth daily before breakfast.   omeprazole 20 MG capsule Commonly known as:  PRILOSEC TAKE 1 CAPSULE BY MOUTH EVERY DAY   rivaroxaban 20 MG Tabs tablet Commonly known as:  XARELTO Take 20 mg  by mouth daily with supper.   sildenafil 20 MG tablet Commonly known as:  REVATIO Take 1 tablet (20 mg total) by mouth 3 (three) times daily as needed (ED).   telmisartan 20 MG tablet Commonly known as:  MICARDIS Take 20 mg by mouth daily.   Vitamin D3 5000 units Tabs Take 5,000 Units by mouth daily.       Disposition: Home  Discharge Instructions    Diet - low sodium heart healthy   Complete by:  As directed    Increase activity slowly   Complete by:  As directed      Follow-up Information    [facility] Follow up on 11/14/2017.   Specialty:  Cardiology Why:  8:30AM Contact information: [id number] [place] [phone]       Thompson Grayer, MD Follow up on 01/17/2018.   Specialty:  Cardiology Why:  9:45AM Contact information: [place] [address] [phone]           Duration of Discharge Encounter: Greater than 30 minutes including physician time.  Signed, Tommye Standard, PA-C 10/18/2017 10:53 AM   I have seen, examined the patient, and reviewed the above assessment and plan.  Changes to above are made where necessary.  On exam, RRR.  Doing well s/p ablation.  DC to home with routine follow-up.  Stop amiodarone.  Co Sign: Thompson Grayer, MD 10/18/2017 2:49 PM

## 2017-10-18 NOTE — Progress Notes (Signed)
Patient went into a rate controlled A-Fib/Flutter shortly after coming back from having a CXR, VSS with HR 60-70's and B/P 113/58, labs are improving. Paient is sitting up in the chair but said that when he went to get into the chair he had a short wave of a feeling like being on a porch swing swaying back and forth, no pain just never experienced that feeling before, got an EKG yo confirm and placed a text page to the Cardiology PA, will let daylight know in report.

## 2017-11-06 ENCOUNTER — Ambulatory Visit
Admission: RE | Admit: 2017-11-06 | Discharge: 2017-11-06 | Disposition: A | Discharge status: Home / Self Care | Payer: PPO | Source: Ambulatory Visit | Attending: Cardiology | Admitting: Cardiology

## 2017-11-06 ENCOUNTER — Other Ambulatory Visit: Payer: Self-pay | Admitting: Cardiology

## 2017-11-06 DIAGNOSIS — M79661 Pain in right lower leg: Secondary | ICD-10-CM | POA: Diagnosis not present

## 2017-11-06 DIAGNOSIS — R1030 Lower abdominal pain, unspecified: Secondary | ICD-10-CM | POA: Diagnosis not present

## 2017-11-06 DIAGNOSIS — R609 Edema, unspecified: Secondary | ICD-10-CM

## 2017-11-06 DIAGNOSIS — M7989 Other specified soft tissue disorders: Secondary | ICD-10-CM | POA: Diagnosis not present

## 2017-11-06 DIAGNOSIS — I481 Persistent atrial fibrillation: Secondary | ICD-10-CM | POA: Diagnosis not present

## 2017-11-14 ENCOUNTER — Ambulatory Visit (HOSPITAL_COMMUNITY)
Admission: RE | Admit: 2017-11-14 | Discharge: 2017-11-14 | Disposition: A | Discharge status: Home / Self Care | Payer: PPO | Source: Ambulatory Visit | Attending: Nurse Practitioner | Admitting: Nurse Practitioner

## 2017-11-14 ENCOUNTER — Encounter (HOSPITAL_COMMUNITY): Payer: Self-pay | Admitting: Nurse Practitioner

## 2017-11-14 ENCOUNTER — Ambulatory Visit (HOSPITAL_BASED_OUTPATIENT_CLINIC_OR_DEPARTMENT_OTHER)
Admission: RE | Admit: 2017-11-14 | Discharge: 2017-11-14 | Disposition: A | Discharge status: Home / Self Care | Payer: PPO | Source: Ambulatory Visit | Attending: Nurse Practitioner | Admitting: Nurse Practitioner

## 2017-11-14 VITALS — BP 167/102 | HR 76 | Ht 68.0 in | Wt 173.0 lb

## 2017-11-14 DIAGNOSIS — Z7901 Long term (current) use of anticoagulants: Secondary | ICD-10-CM | POA: Diagnosis not present

## 2017-11-14 DIAGNOSIS — E785 Hyperlipidemia, unspecified: Secondary | ICD-10-CM | POA: Insufficient documentation

## 2017-11-14 DIAGNOSIS — Z8249 Family history of ischemic heart disease and other diseases of the circulatory system: Secondary | ICD-10-CM | POA: Diagnosis not present

## 2017-11-14 DIAGNOSIS — Z800 Family history of malignant neoplasm of digestive organs: Secondary | ICD-10-CM | POA: Diagnosis not present

## 2017-11-14 DIAGNOSIS — R1909 Other intra-abdominal and pelvic swelling, mass and lump: Secondary | ICD-10-CM

## 2017-11-14 DIAGNOSIS — Z803 Family history of malignant neoplasm of breast: Secondary | ICD-10-CM | POA: Diagnosis not present

## 2017-11-14 DIAGNOSIS — K219 Gastro-esophageal reflux disease without esophagitis: Secondary | ICD-10-CM | POA: Insufficient documentation

## 2017-11-14 DIAGNOSIS — Z79899 Other long term (current) drug therapy: Secondary | ICD-10-CM | POA: Insufficient documentation

## 2017-11-14 DIAGNOSIS — H9193 Unspecified hearing loss, bilateral: Secondary | ICD-10-CM | POA: Insufficient documentation

## 2017-11-14 DIAGNOSIS — M1990 Unspecified osteoarthritis, unspecified site: Secondary | ICD-10-CM | POA: Diagnosis not present

## 2017-11-14 DIAGNOSIS — I4891 Unspecified atrial fibrillation: Secondary | ICD-10-CM

## 2017-11-14 DIAGNOSIS — Z96612 Presence of left artificial shoulder joint: Secondary | ICD-10-CM | POA: Insufficient documentation

## 2017-11-14 DIAGNOSIS — R1900 Intra-abdominal and pelvic swelling, mass and lump, unspecified site: Secondary | ICD-10-CM | POA: Insufficient documentation

## 2017-11-14 DIAGNOSIS — Z79890 Hormone replacement therapy (postmenopausal): Secondary | ICD-10-CM | POA: Diagnosis not present

## 2017-11-14 DIAGNOSIS — I10 Essential (primary) hypertension: Secondary | ICD-10-CM | POA: Diagnosis not present

## 2017-11-14 DIAGNOSIS — I440 Atrioventricular block, first degree: Secondary | ICD-10-CM | POA: Diagnosis not present

## 2017-11-14 DIAGNOSIS — I480 Paroxysmal atrial fibrillation: Secondary | ICD-10-CM

## 2017-11-14 NOTE — Progress Notes (Signed)
Primary Care Physician: Jerrol Banana., MD Referring Physician: Dr. Thompson Grayer Jeffery Davis is a 72 y.o. male with a h/o afib s/p ablation 8/6. He is in the afib clinic for f/u of ablation one month ago. He had some discomfort at his rt groin and saw his cardiologist in the [place] area 8/26 and has an U/S. He did not show any pseudoaneurysm or hematoma but several small lymph nodes. The pt reports that he feels several "knots in the area that have gotten bigger" since last week and the area is more sore than it was. Walkthrough he has resumed his walking of 3 miles several times a week and is tolerating this activity. He denies any afib. Amiodarone was stopped at the time of ablation. No issues with swallowing.   Today, he denies symptoms of palpitations, chest pain, shortness of breath, orthopnea, PND, lower extremity edema, dizziness, presyncope, syncope, or neurologic sequela. The patient is tolerating medications without difficulties and is otherwise without complaint today.   Past Medical History:  Diagnosis Date  . Actinic keratosis   . Allergy   . Arthritis    degenerative  . Atrial fibrillation ([place])    s/p cardioversion 07/18/17  . Benign fibroma of prostate   . Carpal tunnel syndrome, right   . Erectile dysfunction   . GERD (gastroesophageal reflux disease)   . Hyperlipidemia   . Hypertension   . Lumbar herniated disc   . Lumbar stenosis with neurogenic claudication   . LV dysfunction   . Neural foraminal stenosis of lumbosacral spine   . Primary localized osteoarthrosis of left shoulder 03/01/2016  . Wears hearing aid in both ears    Past Surgical History:  Procedure Laterality Date  . ABLATION OF DYSRHYTHMIC FOCUS  10/17/2017  . ATRIAL FIBRILLATION ABLATION N/A 10/17/2017   Procedure: ATRIAL FIBRILLATION ABLATION;  Surgeon: Thompson Grayer, MD;  Location: [facility];  Service: Cardiovascular;  Laterality: N/A;  . CARDIAC CATHETERIZATION  2005   [place] N/A 07/18/2017   Procedure: CARDIOVERSION;  Surgeon: Teodoro Spray, MD;  Location: [facility] ORS;  Service: Cardiovascular;  Laterality: N/A;  . CATARACT EXTRACTION  06/2002  . COLONOSCOPY    . COLONOSCOPY WITH PROPOFOL N/A 05/03/2017   Procedure: COLONOSCOPY WITH PROPOFOL;  Surgeon: Manya Silvas, MD;  [facility] ENDOS[facility];  Service: Endoscopy;  Laterality: N/A;  . ESOPHAGOGASTRODUODENOSCOPY (EGD) WITH PROPOFOL N/A 01/08/2016   Procedure: ESOPHAGOGASTRODUODENOSCOPY (EGD) WITH PROPOFOL;  Surgeon: Manya Silvas, MD;  Location: [facility] ENDOSCOPY;  Service: Endoscopy;  Laterality: N/A;  . KNEE ARTHROSCOPY Right   . LUMBAR LAMINECTOMY/DECOMPRESSION MICRODISCECTOMY N/A 01/09/2017   Procedure: LAMINECTOMY AND FORAMINOTOMY LUMBAR THREE-FOUR LUMBAR FOUR-FIVE  LUMBAR FIVE SACRAL ONE;  Surgeon: Newman Pies, MD;  Location: [facility];  Service: Neurosurgery;  Laterality: N/A;  . SHOULDER SURGERY Left 2010  . TOTAL SHOULDER ARTHROPLASTY Left 03/01/2016   Procedure: REVERSE TOTAL SHOULDER ARTHROPLASTY;  Surgeon: Marchia Bond, MD;  Location: [facility];  Service: Orthopedics;  Laterality: Left;    Current Outpatient Medications  Medication Sig Dispense Refill  . atorvastatin (LIPITOR) 10 MG tablet Take 1 tablet (10 mg total) by mouth every other day. 46 tablet 3  . Cholecalciferol (VITAMIN D3) 5000 units TABS Take 5,000 Units by mouth daily.    . Coenzyme Q10 (CO Q 10) 100 MG CAPS Take 100 mg by mouth 2 (two) times daily.     . diphenhydramine-acetaminophen (TYLENOL PM) 25-500 MG TABS tablet  Take 2 tablets by mouth at bedtime as needed (sleep).    Marland Kitchen ketoconazole (NIZORAL) 2 % cream Apply 1 application topically every other day.    . levothyroxine (SYNTHROID) 25 MCG tablet Take 1 tablet (25 mcg total) by mouth daily before breakfast. 30 tablet 5  . Misc Natural Products (GLUCOSAMINE CHONDROITIN TRIPLE) TABS Take 1 tablet by mouth 2 (two) times daily.    Marland Kitchen omeprazole  (PRILOSEC) 20 MG capsule TAKE 1 CAPSULE BY MOUTH EVERY DAY 90 capsule 3  . rivaroxaban (XARELTO) 20 MG TABS tablet Take 20 mg by mouth daily with supper.    . sildenafil (REVATIO) 20 MG tablet Take 1 tablet (20 mg total) by mouth 3 (three) times daily as needed (ED). 90 tablet 3  . telmisartan (MICARDIS) 20 MG tablet Take 20 mg by mouth daily.      No current facility-administered medications for this encounter.     No Known Allergies  Social History   Socioeconomic History  . Marital status: Married    Spouse name: Coralyn Mark  . Number of children: 2  . Years of education: bachelors  . Highest education level: Bachelor's degree (e.g., BA, AB, BS)  Occupational History  . Occupation: Retired     Comment: worked at [employer] 12 years.     Employer: [employer]  Social Needs  . Financial resource strain: Not hard at all  . Food insecurity:    Worry: Never true    Inability: Never true  . Transportation needs:    Medical: No    Non-medical: No  Tobacco Use  . Smoking status: Never Smoker  . Smokeless tobacco: Never Used  Substance and Sexual Activity  . Alcohol use: Yes    Alcohol/week: 4.0 - 6.0 standard drinks    Types: 4 - 6 Glasses of wine per week    Comment: 1-2 glasses of wine daily  . Drug use: No  . Sexual activity: Not on file  Lifestyle  . Physical activity:    Days per week: Not on file    Minutes per session: Not on file  . Stress: Only a little  Relationships  . Social connections:    Talks on phone: Not on file    Gets together: Not on file    Attends religious service: Not on file    Active member of club or organization: Not on file    Attends meetings of clubs or organizations: Not on file    Relationship status: Not on file  . Intimate partner violence:    Fear of current or ex partner: Not on file    Emotionally abused: Not on file    Physically abused: Not on file    Forced sexual activity: Not on file  Other Topics  Concern  . Not on file  Social History Narrative  . Not on file    Family History  Problem Relation Age of Onset  . Cancer Mother        breast and pancreatic  . CAD Father   . Hypertension Father   . Healthy Daughter   . Healthy Son     ROS- All systems are reviewed and negative except as per the HPI above  Physical Exam: Vitals:   11/14/17 0848  BP: (!) 167/102  Pulse: 76  Weight: 78.5 kg  Height: 5\' 8"  (1.727 m)   Wt Readings from Last 3 Encounters:  11/14/17 78.5 kg  10/18/17 79.3 kg  10/04/17  79.2 kg    Labs: Lab Results  Component Value Date   NA 141 09/18/2017   K 4.5 09/18/2017   CL 105 09/18/2017   CO2 22 09/18/2017   GLUCOSE 93 09/18/2017   BUN 16 09/18/2017   CREATININE 1.10 09/18/2017   CALCIUM 9.5 09/18/2017   Lab Results  Component Value Date   INR 1.09 08/01/2017   Lab Results  Component Value Date   CHOL 143 10/04/2017   HDL 46 10/04/2017   LDLCALC 68 10/04/2017   TRIG 143 10/04/2017     GEN- The patient is well appearing, alert and oriented x 3 today.   Head- normocephalic, atraumatic Eyes-  Sclera clear, conjunctiva pink Ears- hearing intact Oropharynx- clear Neck- supple, no JVP Lymph- no cervical lymphadenopathy Lungs- Clear to ausculation bilaterally, normal work of breathing Heart- Regular rate and rhythm, no murmurs, rubs or gallops, PMI not laterally displaced GI- soft, NT, ND, + BS Extremities- no clubbing, cyanosis, or edema MS- no significant deformity or atrophy Skin- no rash or lesion Psych- euthymic mood, full affect Neuro- strength and sensation are intact  EKG-SR with first degree AV block. PR int 234 ms, qrs int 96 ms, qtc 470 ms Epic records reviewed    Assessment and Plan: 1. afib  S/p ablation x one month No Afib to report Off amiodarone   2. Chadsvasc score of 2 Continue xarelto 20 mg daily, do not interrupt therapy  3. Discomfort rt groin area with "knots"  Pt states that discomfort is worse  and "knots are bigger Will repeat U/S today and inform pt of result  F/u with Dr. Rayann Heman 11/6  Geroge Baseman. Linh Johannes, [facility] [address] [place], [place] [address] [phone]

## 2017-11-14 NOTE — Progress Notes (Signed)
Right groin limited arterial duplex has been completed. Negative for pseudoaneurysm.  Results were given to Roderic Palau NP.  11/14/17 9:59 AM Jeffery Davis RVT

## 2017-12-19 ENCOUNTER — Ambulatory Visit
Admission: RE | Admit: 2017-12-19 | Discharge: 2017-12-19 | Disposition: A | Discharge status: Home / Self Care | Payer: PPO | Source: Ambulatory Visit | Attending: Family Medicine | Admitting: Family Medicine

## 2017-12-19 ENCOUNTER — Ambulatory Visit (INDEPENDENT_AMBULATORY_CARE_PROVIDER_SITE_OTHER): Payer: PPO | Admitting: Family Medicine

## 2017-12-19 VITALS — BP 132/74 | HR 68 | Temp 98.1°F | Resp 16 | Wt 176.0 lb

## 2017-12-19 DIAGNOSIS — M25559 Pain in unspecified hip: Secondary | ICD-10-CM | POA: Diagnosis present

## 2017-12-19 DIAGNOSIS — R1031 Right lower quadrant pain: Secondary | ICD-10-CM

## 2017-12-19 DIAGNOSIS — Z981 Arthrodesis status: Secondary | ICD-10-CM | POA: Diagnosis not present

## 2017-12-19 DIAGNOSIS — M25551 Pain in right hip: Secondary | ICD-10-CM

## 2017-12-19 NOTE — Progress Notes (Signed)
Jeffery Davis  MRN: [id number] DOB: 07-30-1945  Subjective:  HPI  The patient is a 72 year old male who presents for right hip pain.  He states it started mid August which was 1 week after he had his cardiac ablation.  He has since had 2 ultrasounds of the area both of which were negative.   He states it started in the area that they went in at the groin for the ablation.  It radiate up a little and to the front of the groin, then down the leg to the knee.  It is worse at night and in the morning he feels it the most.  After he is up and moving around a little it improves some but does not go away. Patient has history of back pain and states it might be a little worse since having his ablation.  Patient Active Problem List   Diagnosis Date Noted  . Persistent atrial fibrillation 10/17/2017  . Neural foraminal stenosis of lumbosacral spine 08/03/2017  . Myocardiopathy ([place]) 07/03/2017  . Ataxia 06/13/2017  . Atrial fibrillation ([place]) 06/13/2017  . Lumbar stenosis with neurogenic claudication 01/09/2017  . Diastasis recti 11/17/2016  . Patellofemoral stress syndrome 07/21/2016  . Acquired trigger finger 07/21/2016  . Acute deep vein thrombosis (DVT) of proximal vein of left lower extremity ([place]) 03/24/2016  . Primary localized osteoarthrosis of left shoulder 03/01/2016  . S/P shoulder replacement 03/01/2016  . Medicare annual wellness visit, subsequent 09/29/2015  . Chest tightness 09/29/2015  . Fall with injury 09/29/2015  . Actinic keratoses 08/19/2014  . Allergic rhinitis 08/19/2014  . Benign fibroma of prostate 08/19/2014  . Acid indigestion 08/19/2014  . Failure of erection 08/19/2014  . Acid reflux 08/19/2014  . Hypercholesteremia 08/19/2014  . Arthritis, degenerative 08/19/2014    Past Medical History:  Diagnosis Date  . Actinic keratosis   . Allergy   . Arthritis    degenerative  . Atrial fibrillation ([place])    s/p cardioversion 07/18/17  . Benign fibroma of  prostate   . Carpal tunnel syndrome, right   . Erectile dysfunction   . GERD (gastroesophageal reflux disease)   . Hyperlipidemia   . Hypertension   . Lumbar herniated disc   . Lumbar stenosis with neurogenic claudication   . LV dysfunction   . Neural foraminal stenosis of lumbosacral spine   . Primary localized osteoarthrosis of left shoulder 03/01/2016  . Wears hearing aid in both ears     Social History   Socioeconomic History  . Marital status: Married    Spouse name: Coralyn Mark  . Number of children: 2  . Years of education: bachelors  . Highest education level: Bachelor's degree (e.g., BA, AB, BS)  Occupational History  . Occupation: Retired     Comment: worked at [employer] 12 years.     Employer: [employer]  Social Needs  . Financial resource strain: Not hard at all  . Food insecurity:    Worry: Never true    Inability: Never true  . Transportation needs:    Medical: No    Non-medical: No  Tobacco Use  . Smoking status: Never Smoker  . Smokeless tobacco: Never Used  Substance and Sexual Activity  . Alcohol use: Yes    Alcohol/week: 4.0 - 6.0 standard drinks    Types: 4 - 6 Glasses of wine per week    Comment: 1-2 glasses of wine daily  . Drug use: No  .  Sexual activity: Not on file  Lifestyle  . Physical activity:    Days per week: Not on file    Minutes per session: Not on file  . Stress: Only a little  Relationships  . Social connections:    Talks on phone: Not on file    Gets together: Not on file    Attends religious service: Not on file    Active member of club or organization: Not on file    Attends meetings of clubs or organizations: Not on file    Relationship status: Not on file  . Intimate partner violence:    Fear of current or ex partner: Not on file    Emotionally abused: Not on file    Physically abused: Not on file    Forced sexual activity: Not on file  Other Topics Concern  . Not on file  Social History  Narrative  . Not on file    Outpatient Encounter Medications as of 12/19/2017  Medication Sig  . atorvastatin (LIPITOR) 10 MG tablet Take 1 tablet (10 mg total) by mouth every other day.  . Cholecalciferol (VITAMIN D3) 5000 units TABS Take 5,000 Units by mouth daily.  . Coenzyme Q10 (CO Q 10) 100 MG CAPS Take 100 mg by mouth 2 (two) times daily.   . diphenhydramine-acetaminophen (TYLENOL PM) 25-500 MG TABS tablet Take 2 tablets by mouth at bedtime as needed (sleep).  Marland Kitchen ketoconazole (NIZORAL) 2 % cream Apply 1 application topically every other day.  . levothyroxine (SYNTHROID) 25 MCG tablet Take 1 tablet (25 mcg total) by mouth daily before breakfast.  . Misc Natural Products (GLUCOSAMINE CHONDROITIN TRIPLE) TABS Take 1 tablet by mouth 2 (two) times daily.  Marland Kitchen omeprazole (PRILOSEC) 20 MG capsule TAKE 1 CAPSULE BY MOUTH EVERY DAY  . rivaroxaban (XARELTO) 20 MG TABS tablet Take 20 mg by mouth daily with supper.  . sildenafil (REVATIO) 20 MG tablet Take 1 tablet (20 mg total) by mouth 3 (three) times daily as needed (ED).  Marland Kitchen telmisartan (MICARDIS) 20 MG tablet Take 20 mg by mouth daily.    No facility-administered encounter medications on file as of 12/19/2017.     No Known Allergies  Review of Systems  Constitutional: Negative for fever and malaise/fatigue.  Eyes: Negative.   Respiratory: Negative for cough, shortness of breath and wheezing.   Cardiovascular: Positive for leg swelling (possibly in the upper part of the leg in the area near the groin). Negative for chest pain, palpitations, orthopnea and claudication.  Gastrointestinal: Negative.   Musculoskeletal: Positive for joint pain and myalgias.  Skin: Negative.   Endo/Heme/Allergies: Negative.   Psychiatric/Behavioral: Negative.     Objective:  BP 132/74 (BP Location: Right Arm, Patient Position: Sitting, Cuff Size: Normal)   Pulse 68   Temp 98.1 F (36.7 C) (Oral)   Resp 16   Wt 176 lb (79.8 kg)   SpO2 98%   BMI 26.76  kg/m   Physical Exam  Constitutional: He is oriented to person, place, and time and well-developed, well-nourished, and in no distress.  HENT:  Head: Normocephalic and atraumatic.  Eyes: Conjunctivae are normal.  Neck: No thyromegaly present.  Cardiovascular: Normal rate, regular rhythm and normal heart sounds.  Pulmonary/Chest: Effort normal.  Abdominal: Soft.  Musculoskeletal: He exhibits no tenderness or deformity.  Mild pain with figure 4 maneuver.  Neurological: He is alert and oriented to person, place, and time. Gait normal. GCS score is 15.  Skin: Skin is warm and dry.  Psychiatric: Mood, memory, affect and judgment normal.    Assessment and Plan :  1. Right inguinal pain  - DG HIP UNILAT WITH PELVIS 2-3 VIEWS RIGHT; Future  2. Pain of right hip joint Refer to Ortho. - DG HIP UNILAT WITH PELVIS 2-3 VIEWS RIGHT; Future  I have done the exam and reviewed the chart and it is accurate to the best of my knowledge. Development worker, community has been used and  any errors in dictation or transcription are unintentional. Miguel Aschoff M.D. [facility]

## 2017-12-21 DIAGNOSIS — S76911A Strain of unspecified muscles, fascia and tendons at thigh level, right thigh, initial encounter: Secondary | ICD-10-CM | POA: Diagnosis not present

## 2017-12-25 DIAGNOSIS — M6281 Muscle weakness (generalized): Secondary | ICD-10-CM | POA: Diagnosis not present

## 2017-12-25 DIAGNOSIS — M25551 Pain in right hip: Secondary | ICD-10-CM | POA: Diagnosis not present

## 2017-12-25 DIAGNOSIS — S76911D Strain of unspecified muscles, fascia and tendons at thigh level, right thigh, subsequent encounter: Secondary | ICD-10-CM | POA: Diagnosis not present

## 2017-12-26 ENCOUNTER — Telehealth: Payer: Self-pay | Admitting: Family Medicine

## 2017-12-26 NOTE — Telephone Encounter (Signed)
Pt needing blood work order.  Please call pt when ready for pickup.  Thanks, [employer]

## 2017-12-26 NOTE — Telephone Encounter (Signed)
Looks like patient is only due for TSH. Does patient need any other labs? Please advise. Thanks!

## 2017-12-28 ENCOUNTER — Other Ambulatory Visit: Payer: Self-pay

## 2017-12-28 DIAGNOSIS — M6281 Muscle weakness (generalized): Secondary | ICD-10-CM | POA: Diagnosis not present

## 2017-12-28 DIAGNOSIS — E039 Hypothyroidism, unspecified: Secondary | ICD-10-CM

## 2017-12-28 DIAGNOSIS — M25551 Pain in right hip: Secondary | ICD-10-CM | POA: Diagnosis not present

## 2017-12-28 DIAGNOSIS — S76911D Strain of unspecified muscles, fascia and tendons at thigh level, right thigh, subsequent encounter: Secondary | ICD-10-CM | POA: Diagnosis not present

## 2017-12-28 NOTE — Telephone Encounter (Signed)
Pt called back about getting lab slip. Spoke with Jiles Garter and pt was advised lab slip would be ready today. Thanks TNP

## 2017-12-28 NOTE — Progress Notes (Signed)
tsh

## 2017-12-29 DIAGNOSIS — E039 Hypothyroidism, unspecified: Secondary | ICD-10-CM | POA: Diagnosis not present

## 2017-12-30 LAB — TSH: TSH: 2.78 u[IU]/mL (ref 0.450–4.500)

## 2018-01-02 ENCOUNTER — Telehealth: Payer: Self-pay

## 2018-01-02 NOTE — Telephone Encounter (Signed)
Pt advised.   Thanks,   -Laura  

## 2018-01-02 NOTE — Telephone Encounter (Signed)
-----   Message from Jerrol Banana., MD sent at 01/01/2018  4:05 PM EDT ----- Normal thyroid.

## 2018-01-04 DIAGNOSIS — S76911D Strain of unspecified muscles, fascia and tendons at thigh level, right thigh, subsequent encounter: Secondary | ICD-10-CM | POA: Diagnosis not present

## 2018-01-04 DIAGNOSIS — M6281 Muscle weakness (generalized): Secondary | ICD-10-CM | POA: Diagnosis not present

## 2018-01-04 DIAGNOSIS — M25551 Pain in right hip: Secondary | ICD-10-CM | POA: Diagnosis not present

## 2018-01-08 ENCOUNTER — Ambulatory Visit (INDEPENDENT_AMBULATORY_CARE_PROVIDER_SITE_OTHER): Payer: PPO | Admitting: Family Medicine

## 2018-01-08 VITALS — BP 154/91 | HR 67 | Temp 98.0°F | Resp 16 | Wt 177.0 lb

## 2018-01-08 DIAGNOSIS — I10 Essential (primary) hypertension: Secondary | ICD-10-CM

## 2018-01-08 DIAGNOSIS — I480 Paroxysmal atrial fibrillation: Secondary | ICD-10-CM

## 2018-01-08 DIAGNOSIS — E039 Hypothyroidism, unspecified: Secondary | ICD-10-CM | POA: Diagnosis not present

## 2018-01-08 NOTE — Patient Instructions (Signed)
Stop Synthroid.

## 2018-01-08 NOTE — Progress Notes (Signed)
Jeffery Davis  MRN: [id number] DOB: 11/27/1945  Subjective:  HPI   The patient is a 72 year old male who presents for follow up of recently diagnosed hypothyroidism.  The patient was last seen on 12/19/17.   Patient had a visit in July for his annual exam and on labs done at that time it revealed he was hypothyroid.  The patient states he had been taking Amiodarone at that time and one of the side effects of that medicine is the effect on the thyroid function.  The patient has since had his TSH checked and his level is back to normal.   The patient has been off of the Amiodarone since August 6.  He feels that the medicine caused the change in the thyroid function and is hoping to be taken off of the Levothyroxine.  Patient Active Problem List   Diagnosis Date Noted  . Persistent atrial fibrillation 10/17/2017  . Neural foraminal stenosis of lumbosacral spine 08/03/2017  . Myocardiopathy ([place]) 07/03/2017  . Ataxia 06/13/2017  . Atrial fibrillation ([place]) 06/13/2017  . Lumbar stenosis with neurogenic claudication 01/09/2017  . Diastasis recti 11/17/2016  . Patellofemoral stress syndrome 07/21/2016  . Acquired trigger finger 07/21/2016  . Acute deep vein thrombosis (DVT) of proximal vein of left lower extremity ([place]) 03/24/2016  . Primary localized osteoarthrosis of left shoulder 03/01/2016  . S/P shoulder replacement 03/01/2016  . Medicare annual wellness visit, subsequent 09/29/2015  . Chest tightness 09/29/2015  . Fall with injury 09/29/2015  . Actinic keratoses 08/19/2014  . Allergic rhinitis 08/19/2014  . Benign fibroma of prostate 08/19/2014  . Acid indigestion 08/19/2014  . Failure of erection 08/19/2014  . Acid reflux 08/19/2014  . Hypercholesteremia 08/19/2014  . Arthritis, degenerative 08/19/2014    Past Medical History:  Diagnosis Date  . Actinic keratosis   . Allergy   . Arthritis    degenerative  . Atrial fibrillation ([place])    s/p cardioversion 07/18/17  . Benign  fibroma of prostate   . Carpal tunnel syndrome, right   . Erectile dysfunction   . GERD (gastroesophageal reflux disease)   . Hyperlipidemia   . Hypertension   . Lumbar herniated disc   . Lumbar stenosis with neurogenic claudication   . LV dysfunction   . Neural foraminal stenosis of lumbosacral spine   . Primary localized osteoarthrosis of left shoulder 03/01/2016  . Wears hearing aid in both ears     Social History   Socioeconomic History  . Marital status: Married    Spouse name: Coralyn Mark  . Number of children: 2  . Years of education: bachelors  . Highest education level: Bachelor's degree (e.g., BA, AB, BS)  Occupational History  . Occupation: Retired     Comment: worked at [employer] 12 years.     Employer: [employer]  Social Needs  . Financial resource strain: Not hard at all  . Food insecurity:    Worry: Never true    Inability: Never true  . Transportation needs:    Medical: No    Non-medical: No  Tobacco Use  . Smoking status: Never Smoker  . Smokeless tobacco: Never Used  Substance and Sexual Activity  . Alcohol use: Yes    Alcohol/week: 4.0 - 6.0 standard drinks    Types: 4 - 6 Glasses of wine per week    Comment: 1-2 glasses of wine daily  . Drug use: No  . Sexual activity: Not on file  Lifestyle  . Physical activity:    Days per week: Not on file    Minutes per session: Not on file  . Stress: Only a little  Relationships  . Social connections:    Talks on phone: Not on file    Gets together: Not on file    Attends religious service: Not on file    Active member of club or organization: Not on file    Attends meetings of clubs or organizations: Not on file    Relationship status: Not on file  . Intimate partner violence:    Fear of current or ex partner: Not on file    Emotionally abused: Not on file    Physically abused: Not on file    Forced sexual activity: Not on file  Other Topics Concern  . Not on file  Social  History Narrative  . Not on file    Outpatient Encounter Medications as of 01/08/2018  Medication Sig  . atorvastatin (LIPITOR) 10 MG tablet Take 1 tablet (10 mg total) by mouth every other day.  . Cholecalciferol (VITAMIN D3) 5000 units TABS Take 5,000 Units by mouth daily.  . Coenzyme Q10 (CO Q 10) 100 MG CAPS Take 100 mg by mouth 2 (two) times daily.   . diphenhydramine-acetaminophen (TYLENOL PM) 25-500 MG TABS tablet Take 2 tablets by mouth at bedtime as needed (sleep).  . gabapentin (NEURONTIN) 300 MG capsule Take 300 mg by mouth 3 (three) times daily.  Marland Kitchen ketoconazole (NIZORAL) 2 % cream Apply 1 application topically every other day.  . levothyroxine (SYNTHROID) 25 MCG tablet Take 1 tablet (25 mcg total) by mouth daily before breakfast.  . Misc Natural Products (GLUCOSAMINE CHONDROITIN TRIPLE) TABS Take 1 tablet by mouth 2 (two) times daily.  Marland Kitchen omeprazole (PRILOSEC) 20 MG capsule TAKE 1 CAPSULE BY MOUTH EVERY DAY  . rivaroxaban (XARELTO) 20 MG TABS tablet Take 20 mg by mouth daily with supper.  . sildenafil (REVATIO) 20 MG tablet Take 1 tablet (20 mg total) by mouth 3 (three) times daily as needed (ED).  Marland Kitchen telmisartan (MICARDIS) 20 MG tablet Take 20 mg by mouth daily.    No facility-administered encounter medications on file as of 01/08/2018.     No Known Allergies  Review of Systems  Constitutional: Negative for fever and malaise/fatigue.  HENT: Negative.   Eyes: Negative.   Respiratory: Positive for cough (head cold for several days). Negative for shortness of breath and wheezing.   Cardiovascular: Negative for chest pain, palpitations, orthopnea, claudication and leg swelling.  Gastrointestinal: Negative.   Skin: Negative.   Neurological: Negative for dizziness and headaches.  Endo/Heme/Allergies: Negative.   Psychiatric/Behavioral: Negative.     Objective:  BP (!) 154/91 (BP Location: Right Arm, Patient Position: Sitting, Cuff Size: Normal)   Pulse 67   Temp 98 F  (36.7 C) (Oral)   Resp 16   Wt 177 lb (80.3 kg)   BMI 26.91 kg/m   Physical Exam  Constitutional: He is oriented to person, place, and time and well-developed, well-nourished, and in no distress.  HENT:  Head: Normocephalic and atraumatic.  Eyes: No scleral icterus.  Neck: No thyromegaly present.  Cardiovascular: Normal rate, regular rhythm and normal heart sounds.  Pulmonary/Chest: Effort normal and breath sounds normal.  Musculoskeletal: He exhibits no edema.  Neurological: He is alert and oriented to person, place, and time. Gait normal. GCS score is 15.  Skin: Skin is warm and dry.  Psychiatric: Mood, memory, affect and judgment  normal.    Assessment and Plan :   1. Hypothyroidism, unspecified type Check TSH 2-3 months.Stop synthroid.  2. Essential hypertension  3.Afib Per cardiology.  I have done the exam and reviewed the chart and it is accurate to the best of my knowledge. Development worker, community has been used and  any errors in dictation or transcription are unintentional. Miguel Aschoff M.D. [facility]

## 2018-01-09 DIAGNOSIS — Z6826 Body mass index (BMI) 26.0-26.9, adult: Secondary | ICD-10-CM | POA: Diagnosis not present

## 2018-01-09 DIAGNOSIS — M5416 Radiculopathy, lumbar region: Secondary | ICD-10-CM | POA: Diagnosis not present

## 2018-01-09 DIAGNOSIS — R030 Elevated blood-pressure reading, without diagnosis of hypertension: Secondary | ICD-10-CM | POA: Diagnosis not present

## 2018-01-09 DIAGNOSIS — M9983 Other biomechanical lesions of lumbar region: Secondary | ICD-10-CM | POA: Diagnosis not present

## 2018-01-11 DIAGNOSIS — M6281 Muscle weakness (generalized): Secondary | ICD-10-CM | POA: Diagnosis not present

## 2018-01-11 DIAGNOSIS — M25551 Pain in right hip: Secondary | ICD-10-CM | POA: Diagnosis not present

## 2018-01-11 DIAGNOSIS — S76911D Strain of unspecified muscles, fascia and tendons at thigh level, right thigh, subsequent encounter: Secondary | ICD-10-CM | POA: Diagnosis not present

## 2018-01-16 DIAGNOSIS — M6281 Muscle weakness (generalized): Secondary | ICD-10-CM | POA: Diagnosis not present

## 2018-01-16 DIAGNOSIS — S76911D Strain of unspecified muscles, fascia and tendons at thigh level, right thigh, subsequent encounter: Secondary | ICD-10-CM | POA: Diagnosis not present

## 2018-01-16 DIAGNOSIS — M25551 Pain in right hip: Secondary | ICD-10-CM | POA: Diagnosis not present

## 2018-01-17 ENCOUNTER — Encounter: Payer: Self-pay | Admitting: Internal Medicine

## 2018-01-17 ENCOUNTER — Ambulatory Visit (INDEPENDENT_AMBULATORY_CARE_PROVIDER_SITE_OTHER): Payer: PPO | Admitting: Internal Medicine

## 2018-01-17 VITALS — BP 130/88 | HR 70 | Ht 68.0 in | Wt 174.0 lb

## 2018-01-17 DIAGNOSIS — I480 Paroxysmal atrial fibrillation: Secondary | ICD-10-CM | POA: Diagnosis not present

## 2018-01-17 NOTE — Progress Notes (Signed)
PCP: Jerrol Banana., MD Primary Cardiologist: Dr Dyann Kief Jeffery Davis is a 72 y.o. male who presents today for routine electrophysiology followup.  Since his recent afib ablation, the patient reports doing very well.  he denies procedure related complications and is pleased with the results of the procedure.  He did have R hip tendonitis which limited his mobility post ablation, which has now resolved.  Today, he denies symptoms of palpitations, chest pain, shortness of breath,  lower extremity edema, dizziness, presyncope, or syncope.  The patient is otherwise without complaint today.   Past Medical History:  Diagnosis Date  . Actinic keratosis   . Allergy   . Arthritis    degenerative  . Atrial fibrillation ([place])    s/p cardioversion 07/18/17  . Benign fibroma of prostate   . Carpal tunnel syndrome, right   . Erectile dysfunction   . GERD (gastroesophageal reflux disease)   . Hyperlipidemia   . Hypertension   . Lumbar herniated disc   . Lumbar stenosis with neurogenic claudication   . LV dysfunction   . Neural foraminal stenosis of lumbosacral spine   . Primary localized osteoarthrosis of left shoulder 03/01/2016  . Wears hearing aid in both ears    Past Surgical History:  Procedure Laterality Date  . ABLATION OF DYSRHYTHMIC FOCUS  10/17/2017  . ATRIAL FIBRILLATION ABLATION N/A 10/17/2017   Procedure: ATRIAL FIBRILLATION ABLATION;  Surgeon: Thompson Grayer, MD;  Location: [facility];  Service: Cardiovascular;  Laterality: N/A;  . CARDIAC CATHETERIZATION  2005   [place] N/A 07/18/2017   Procedure: CARDIOVERSION;  Surgeon: Teodoro Spray, MD;  Location: [facility];  Service: Cardiovascular;  Laterality: N/A;  . CATARACT EXTRACTION  06/2002  . COLONOSCOPY    . COLONOSCOPY WITH PROPOFOL N/A 05/03/2017   Procedure: COLONOSCOPY WITH PROPOFOL;  Surgeon: Manya Silvas, MD;  Location[facility];  Service: Endoscopy;  Laterality: N/A;  .  ESOPHAGOGASTRODUODENOSCOPY (EGD) WITH PROPOFOL N/A 01/08/2016   Procedure: ESOPHAGOGASTRODUODENOSCOPY (EGD) WITH PROPOFOL;  Surgeon: Manya Silvas, MD;  Location: [facility] ENDOSCOPY;  Service: Endoscopy;  Laterality: N/A;  . KNEE ARTHROSCOPY Right   . LUMBAR LAMINECTOMY/DECOMPRESSION MICRODISCECTOMY N/A 01/09/2017   Procedure: LAMINECTOMY AND FORAMINOTOMY LUMBAR THREE-FOUR LUMBAR FOUR-FIVE  LUMBAR FIVE SACRAL ONE;  Surgeon: Newman Pies, MD;  Location: [place];  Service: Neurosurgery;  Laterality: N/A;  . SHOULDER SURGERY Left 2010  . TOTAL SHOULDER ARTHROPLASTY Left 03/01/2016   Procedure: REVERSE TOTAL SHOULDER ARTHROPLASTY;  Surgeon: Marchia Bond, MD;  Location: [facility];  Service: Orthopedics;  Laterality: Left;    ROS- all systems are personally reviewed and negatives except as per HPI above  Current Outpatient Medications  Medication Sig Dispense Refill  . atorvastatin (LIPITOR) 10 MG tablet Take 1 tablet (10 mg total) by mouth every other day. 46 tablet 3  . Cholecalciferol (VITAMIN D3) 5000 units TABS Take 5,000 Units by mouth daily.    . Coenzyme Q10 (CO Q 10) 100 MG CAPS Take 100 mg by mouth 2 (two) times daily.     . diphenhydramine-acetaminophen (TYLENOL PM) 25-500 MG TABS tablet Take 2 tablets by mouth at bedtime as needed (sleep).    . gabapentin (NEURONTIN) 300 MG capsule Take 300 mg by mouth 3 (three) times daily.    Marland Kitchen ketoconazole (NIZORAL) 2 % cream Apply 1 application topically every other day.    . levothyroxine (SYNTHROID) 25 MCG tablet Take 1 tablet (25 mcg total) by mouth  daily before breakfast. 30 tablet 5  . Misc Natural Products (GLUCOSAMINE CHONDROITIN TRIPLE) TABS Take 1 tablet by mouth 2 (two) times daily.    Marland Kitchen omeprazole (PRILOSEC) 20 MG capsule TAKE 1 CAPSULE BY MOUTH EVERY DAY 90 capsule 3  . rivaroxaban (XARELTO) 20 MG TABS tablet Take 20 mg by mouth daily with supper.    . sildenafil (REVATIO) 20 MG tablet Take 1 tablet (20 mg total) by mouth 3 (three) times  daily as needed (ED). 90 tablet 3  . telmisartan (MICARDIS) 20 MG tablet Take 20 mg by mouth daily.      No current facility-administered medications for this visit.     Physical Exam: Vitals:   01/17/18 0943  BP: 130/88  Pulse: 70  SpO2: 97%  Weight: 174 lb (78.9 kg)  Height: 5\' 8"  (1.727 m)    GEN- The patient is well appearing, alert and oriented x 3 today.   Head- normocephalic, atraumatic Eyes-  Sclera clear, conjunctiva pink Ears- hearing intact Oropharynx- clear Lungs- Clear to ausculation bilaterally, normal work of breathing Heart- Regular rate and rhythm, no murmurs, rubs or gallops, PMI not laterally displaced GI- soft, NT, ND, + BS Extremities- no clubbing, cyanosis, or edema  EKG tracing ordered today is personally reviewed and shows sinus rhythm 70 bpm, PR 248 msec, Qtc 470 msec  Assessment and Plan:  1. Persistent atrial fibrillation and atrial flutter Doing well s/p ablation off amiodarone chads2vasc score is 3 (due to concerns of prior stroke). Continue xarelto   He is not excited about long term anticoagulation.  I did offer ILR for long term monitoring.  If he had no afib on ILR, we could consider stopping anticoagulation eventually.  I do not feel that a 30 day monitor would be of any value in this regard.  2. Nonischemic CM Dr Ubaldo Glassing to repeat echo to assess EF response to sinus rhythm  Return to see me in 3 months  Thompson Grayer MD, [facility] 01/17/2018 10:41 AM

## 2018-01-17 NOTE — Patient Instructions (Signed)
Medication Instructions:  Your physician recommends that you continue on your current medications as directed. Please refer to the Current Medication list given to you today.  If you need a refill on your cardiac medications before your next appointment, please call your pharmacy.   Lab work: None Ordered  If you have labs (blood work) drawn today and your tests are completely normal, you will receive your results only by: Marland Kitchen MyChart Message (if you have MyChart) OR . A paper copy in the mail If you have any lab test that is abnormal or we need to change your treatment, we will call you to review the results.  Testing/Procedures: None ordered  Follow-Up: Your physician recommends that you schedule a follow-up appointment in: 3 months with Dr. Rayann Heman   Any Other Special Instructions Will Be Listed Below (If Applicable).

## 2018-02-12 DIAGNOSIS — I480 Paroxysmal atrial fibrillation: Secondary | ICD-10-CM | POA: Diagnosis not present

## 2018-02-12 DIAGNOSIS — R0602 Shortness of breath: Secondary | ICD-10-CM | POA: Diagnosis not present

## 2018-02-12 DIAGNOSIS — M25512 Pain in left shoulder: Secondary | ICD-10-CM | POA: Diagnosis not present

## 2018-02-22 DIAGNOSIS — R0602 Shortness of breath: Secondary | ICD-10-CM | POA: Diagnosis not present

## 2018-03-15 ENCOUNTER — Telehealth: Payer: Self-pay | Admitting: Family Medicine

## 2018-03-15 ENCOUNTER — Other Ambulatory Visit: Payer: Self-pay

## 2018-03-15 DIAGNOSIS — I480 Paroxysmal atrial fibrillation: Secondary | ICD-10-CM | POA: Diagnosis not present

## 2018-03-15 DIAGNOSIS — E039 Hypothyroidism, unspecified: Secondary | ICD-10-CM | POA: Diagnosis not present

## 2018-03-15 DIAGNOSIS — I10 Essential (primary) hypertension: Secondary | ICD-10-CM | POA: Diagnosis not present

## 2018-03-15 DIAGNOSIS — E7800 Pure hypercholesterolemia, unspecified: Secondary | ICD-10-CM | POA: Diagnosis not present

## 2018-03-15 NOTE — Telephone Encounter (Signed)
Labs ordered.  Patient advised.

## 2018-03-15 NOTE — Telephone Encounter (Signed)
Pt is leaving to go out of town today.  Pt needing blood work to be done, this morning if possible.  Please advise pt.  Thanks, [employer]

## 2018-03-15 NOTE — Telephone Encounter (Signed)
Pt calling back checking on the blood work that needs to be done before his appt. W/ Rosanna Randy.  Needing lab orders for this morning.  Thanks, [employer]

## 2018-03-16 LAB — COMPREHENSIVE METABOLIC PANEL
ALT: 25 IU/L (ref 0–44)
AST: 33 IU/L (ref 0–40)
Albumin/Globulin Ratio: 1.8 (ref 1.2–2.2)
Albumin: 4.6 g/dL (ref 3.5–4.8)
Alkaline Phosphatase: 84 IU/L (ref 39–117)
BUN/Creatinine Ratio: 22 (ref 10–24)
BUN: 22 mg/dL (ref 8–27)
Bilirubin Total: 0.4 mg/dL (ref 0.0–1.2)
CO2: 22 mmol/L (ref 20–29)
Calcium: 9.6 mg/dL (ref 8.6–10.2)
Chloride: 103 mmol/L (ref 96–106)
Creatinine, Ser: 1.02 mg/dL (ref 0.76–1.27)
GFR calc Af Amer: 84 mL/min/{1.73_m2} (ref >59)
GFR calc non Af Amer: 73 mL/min/{1.73_m2} (ref >59)
Globulin, Total: 2.5 g/dL (ref 1.5–4.5)
Glucose: 89 mg/dL (ref 65–99)
Potassium: 4.5 mmol/L (ref 3.5–5.2)
Sodium: 142 mmol/L (ref 134–144)
Total Protein: 7.1 g/dL (ref 6.0–8.5)

## 2018-03-16 LAB — CBC WITH DIFFERENTIAL/PLATELET
Basophils Absolute: 0 10*3/uL (ref 0.0–0.2)
Basos: 1 %
EOS (ABSOLUTE): 0.1 10*3/uL (ref 0.0–0.4)
Eos: 1 %
Hematocrit: 42.4 % (ref 37.5–51.0)
Hemoglobin: 14.3 g/dL (ref 13.0–17.7)
Immature Grans (Abs): 0 10*3/uL (ref 0.0–0.1)
Immature Granulocytes: 0 %
Lymphocytes Absolute: 2.1 10*3/uL (ref 0.7–3.1)
Lymphs: 36 %
MCH: 31.4 pg (ref 26.6–33.0)
MCHC: 33.7 g/dL (ref 31.5–35.7)
MCV: 93 fL (ref 79–97)
Monocytes Absolute: 0.7 10*3/uL (ref 0.1–0.9)
Monocytes: 13 %
Neutrophils Absolute: 2.9 10*3/uL (ref 1.4–7.0)
Neutrophils: 49 %
Platelets: 287 10*3/uL (ref 150–450)
RBC: 4.56 x10E6/uL (ref 4.14–5.80)
RDW: 14.7 % (ref 12.3–15.4)
WBC: 5.8 10*3/uL (ref 3.4–10.8)

## 2018-03-16 LAB — LIPID PANEL
Chol/HDL Ratio: 5.8 ratio — ABNORMAL HIGH (ref 0.0–5.0)
Cholesterol, Total: 220 mg/dL — ABNORMAL HIGH (ref 100–199)
HDL: 38 mg/dL — ABNORMAL LOW (ref >39)
Triglycerides: 418 mg/dL — ABNORMAL HIGH (ref 0–149)

## 2018-03-16 LAB — TSH: TSH: 3.66 u[IU]/mL (ref 0.450–4.500)

## 2018-03-20 ENCOUNTER — Telehealth: Payer: Self-pay

## 2018-03-20 NOTE — Telephone Encounter (Signed)
-----   Message from Jerrol Banana., MD sent at 03/19/2018  3:05 PM EST ----- Thyroid okay.  Blood count normal.  Glycerides high suggest work on lifestyle issues to get that down.

## 2018-03-20 NOTE — Telephone Encounter (Signed)
Patient was advised.  

## 2018-03-20 NOTE — Telephone Encounter (Signed)
LVMTRC 

## 2018-03-27 ENCOUNTER — Encounter: Payer: Self-pay | Admitting: Family Medicine

## 2018-03-27 ENCOUNTER — Ambulatory Visit (INDEPENDENT_AMBULATORY_CARE_PROVIDER_SITE_OTHER): Payer: PPO | Admitting: Family Medicine

## 2018-03-27 VITALS — BP 132/72 | HR 68 | Temp 98.2°F | Resp 16 | Ht 68.0 in | Wt 172.0 lb

## 2018-03-27 DIAGNOSIS — I10 Essential (primary) hypertension: Secondary | ICD-10-CM | POA: Diagnosis not present

## 2018-03-27 DIAGNOSIS — M48062 Spinal stenosis, lumbar region with neurogenic claudication: Secondary | ICD-10-CM | POA: Diagnosis not present

## 2018-03-27 DIAGNOSIS — I824Y2 Acute embolism and thrombosis of unspecified deep veins of left proximal lower extremity: Secondary | ICD-10-CM | POA: Diagnosis not present

## 2018-03-27 DIAGNOSIS — R0602 Shortness of breath: Secondary | ICD-10-CM

## 2018-03-27 DIAGNOSIS — I480 Paroxysmal atrial fibrillation: Secondary | ICD-10-CM | POA: Diagnosis not present

## 2018-03-27 NOTE — Progress Notes (Signed)
Patient: Jeffery Davis Male    DOB: Sep 05, 1945   73 y.o.   MRN: [id number] Visit Date: 03/27/2018  Today's Provider: [name], MD   Chief Complaint  Patient presents with  . Hypothyroidism  . Hyperlipidemia   Subjective:     HPI  Patient comes in today for a follow up. He was last seen in the office 3 months ago. Patient wanted to discontinue levothyroxine, and labs were recently checked to see if thyroid would remain stable. Patient reports that he is tolerating med changes well. Lab Results  Component Value Date   TSH 3.660 03/15/2018   Patient also mentions that he has recently made better choices in his diet to lower his cholesterol. He is also taking atorvastatin 10mg  daily. He reports good compliance and good symptom control.  Lab Results  Component Value Date   CHOL 220 (H) 03/15/2018   HDL 38 (L) 03/15/2018   LDLCALC Comment 03/15/2018   TRIG 418 (H) 03/15/2018   CHOLHDL 5.8 (H) 03/15/2018   BP Readings from Last 3 Encounters:  03/27/18 132/72  01/17/18 130/88  01/08/18 (!) 154/91   Wt Readings from Last 3 Encounters:  03/27/18 172 lb (78 kg)  01/17/18 174 lb (78.9 kg)  01/08/18 177 lb (80.3 kg)     No Known Allergies   Current Outpatient Medications:  .  atorvastatin (LIPITOR) 10 MG tablet, Take 1 tablet (10 mg total) by mouth every other day., Disp: 46 tablet, Rfl: 3 .  Cholecalciferol (VITAMIN D3) 5000 units TABS, Take 5,000 Units by mouth daily., Disp: , Rfl:  .  Coenzyme Q10 (CO Q 10) 100 MG CAPS, Take 100 mg by mouth 2 (two) times daily. , Disp: , Rfl:  .  diphenhydramine-acetaminophen (TYLENOL PM) 25-500 MG TABS tablet, Take 2 tablets by mouth at bedtime as needed (sleep)., Disp: , Rfl:  .  gabapentin (NEURONTIN) 300 MG capsule, Take 300 mg by mouth 3 (three) times daily., Disp: , Rfl:  .  ketoconazole (NIZORAL) 2 % cream, Apply 1 application topically every other day., Disp: , Rfl:  .  Misc Natural Products (GLUCOSAMINE CHONDROITIN  TRIPLE) TABS, Take 1 tablet by mouth 2 (two) times daily., Disp: , Rfl:  .  omeprazole (PRILOSEC) 20 MG capsule, TAKE 1 CAPSULE BY MOUTH EVERY DAY, Disp: 90 capsule, Rfl: 3 .  rivaroxaban (XARELTO) 20 MG TABS tablet, Take 20 mg by mouth daily with supper., Disp: , Rfl:  .  sildenafil (REVATIO) 20 MG tablet, Take 1 tablet (20 mg total) by mouth 3 (three) times daily as needed (ED)., Disp: 90 tablet, Rfl: 3 .  telmisartan (MICARDIS) 20 MG tablet, Take 20 mg by mouth daily. , Disp: , Rfl:  .  levothyroxine (SYNTHROID) 25 MCG tablet, Take 1 tablet (25 mcg total) by mouth daily before breakfast. (Patient not taking: Reported on 03/27/2018), Disp: 30 tablet, Rfl: 5  Review of Systems  Constitutional: Negative.  Negative for activity change, appetite change, chills, diaphoresis, fatigue, fever and unexpected weight change.  HENT: Negative.   Eyes: Negative.   Respiratory: Negative.  Negative for cough and shortness of breath.   Cardiovascular: Negative.  Negative for chest pain, palpitations and leg swelling.  Gastrointestinal: Negative.   Endocrine: Negative.   Genitourinary: Negative.   Musculoskeletal: Negative.  Negative for arthralgias and back pain.  Skin: Negative.   Allergic/Immunologic: Negative.   Neurological: Negative.  Negative for dizziness, light-headedness and headaches.  Hematological: Negative.   Psychiatric/Behavioral:  Negative.  Negative for agitation, self-injury, sleep disturbance and suicidal ideas. The patient is not nervous/anxious.     Social History   Tobacco Use  . Smoking status: Never Smoker  . Smokeless tobacco: Never Used  Substance Use Topics  . Alcohol use: Yes    Alcohol/week: 4.0 - 6.0 standard drinks    Types: 4 - 6 Glasses of wine per week    Comment: 1-2 glasses of wine daily      Objective:   BP 132/72 (BP Location: Left Arm, Patient Position: Sitting, Cuff Size: Normal)   Pulse 68   Temp 98.2 F (36.8 C)   Resp 16   Ht 5\' 8"  (1.727 m)   Wt  172 lb (78 kg)   SpO2 100%   BMI 26.15 kg/m  Vitals:   03/27/18 0825  BP: 132/72  Pulse: 68  Resp: 16  Temp: 98.2 F (36.8 C)  SpO2: 100%  Weight: 172 lb (78 kg)  Height: 5\' 8"  (1.727 m)     Physical Exam Vitals signs reviewed.  Constitutional:      Appearance: He is well-developed.  HENT:     Head: Normocephalic and atraumatic.     Right Ear: External ear normal.     Left Ear: External ear normal.     Nose: Nose normal.  Eyes:     General: No scleral icterus.    Conjunctiva/sclera: Conjunctivae normal.     Comments: Right pupil 1 mm smaller than left.  Neck:     Thyroid: No thyromegaly.  Cardiovascular:     Rate and Rhythm: Normal rate and regular rhythm.     Heart sounds: Normal heart sounds.  Pulmonary:     Effort: Pulmonary effort is normal.     Breath sounds: Normal breath sounds.  Abdominal:     Palpations: Abdomen is soft.  Lymphadenopathy:     Cervical: No cervical adenopathy.  Skin:    General: Skin is warm and dry.  Neurological:     General: No focal deficit present.     Mental Status: He is alert and oriented to person, place, and time. Mental status is at baseline.  Psychiatric:        Mood and Affect: Mood normal.        Behavior: Behavior normal.        Thought Content: Thought content normal.        Judgment: Judgment normal.         Assessment & Plan       1. HTN, goal below 140/80 Improving.  2. Paroxysmal atrial fibrillation ([place])   3. Acute deep vein thrombosis (DVT) of proximal vein of left lower extremity ([place])   4. Lumbar stenosis with neurogenic claudication  I have done the exam and reviewed the chart and it is accurate to the best of my knowledge. Development worker, community has been used and  any errors in dictation or transcription are unintentional. Miguel Aschoff M.D. [place], MD  [facility]

## 2018-04-06 DIAGNOSIS — I10 Essential (primary) hypertension: Secondary | ICD-10-CM | POA: Insufficient documentation

## 2018-04-09 ENCOUNTER — Ambulatory Visit: Payer: Self-pay | Admitting: Family Medicine

## 2018-04-20 ENCOUNTER — Other Ambulatory Visit: Payer: Self-pay | Admitting: Family Medicine

## 2018-04-23 ENCOUNTER — Encounter: Payer: Self-pay | Admitting: Internal Medicine

## 2018-04-23 ENCOUNTER — Ambulatory Visit: Payer: PPO | Admitting: Internal Medicine

## 2018-04-23 VITALS — BP 132/78 | HR 60 | Ht 68.0 in | Wt 170.8 lb

## 2018-04-23 DIAGNOSIS — I483 Typical atrial flutter: Secondary | ICD-10-CM | POA: Diagnosis not present

## 2018-04-23 DIAGNOSIS — D229 Melanocytic nevi, unspecified: Secondary | ICD-10-CM | POA: Diagnosis not present

## 2018-04-23 DIAGNOSIS — L578 Other skin changes due to chronic exposure to nonionizing radiation: Secondary | ICD-10-CM | POA: Diagnosis not present

## 2018-04-23 DIAGNOSIS — L570 Actinic keratosis: Secondary | ICD-10-CM | POA: Diagnosis not present

## 2018-04-23 DIAGNOSIS — Z1283 Encounter for screening for malignant neoplasm of skin: Secondary | ICD-10-CM | POA: Diagnosis not present

## 2018-04-23 DIAGNOSIS — I428 Other cardiomyopathies: Secondary | ICD-10-CM | POA: Diagnosis not present

## 2018-04-23 DIAGNOSIS — L820 Inflamed seborrheic keratosis: Secondary | ICD-10-CM | POA: Diagnosis not present

## 2018-04-23 DIAGNOSIS — I4819 Other persistent atrial fibrillation: Secondary | ICD-10-CM

## 2018-04-23 DIAGNOSIS — L821 Other seborrheic keratosis: Secondary | ICD-10-CM | POA: Diagnosis not present

## 2018-04-23 DIAGNOSIS — D1800 Hemangioma unspecified site: Secondary | ICD-10-CM | POA: Diagnosis not present

## 2018-04-23 DIAGNOSIS — L814 Other melanin hyperpigmentation: Secondary | ICD-10-CM | POA: Diagnosis not present

## 2018-04-23 NOTE — Progress Notes (Signed)
PCP: Jerrol Banana., MD Primary Cardiologist: Dr Ubaldo Glassing Primary EP: Dr Thompson Grayer Jeffery Davis is a 73 y.o. male who presents today for routine electrophysiology followup.  Since last being seen in our clinic, the patient reports doing very well.  Today, he denies symptoms of palpitations, chest pain, shortness of breath,  lower extremity edema, dizziness, presyncope, or syncope.  The patient is otherwise without complaint today.   Past Medical History:  Diagnosis Date  . Actinic keratosis   . Allergy   . Arthritis    degenerative  . Atrial fibrillation ([place])    s/p cardioversion 07/18/17  . Benign fibroma of prostate   . Carpal tunnel syndrome, right   . Erectile dysfunction   . GERD (gastroesophageal reflux disease)   . Hyperlipidemia   . Hypertension   . Lumbar herniated disc   . Lumbar stenosis with neurogenic claudication   . LV dysfunction   . Neural foraminal stenosis of lumbosacral spine   . Primary localized osteoarthrosis of left shoulder 03/01/2016  . Wears hearing aid in both ears    Past Surgical History:  Procedure Laterality Date  . ABLATION OF DYSRHYTHMIC FOCUS  10/17/2017  . ATRIAL FIBRILLATION ABLATION N/A 10/17/2017   Procedure: ATRIAL FIBRILLATION ABLATION;  Surgeon: Thompson Grayer, MD;  Location: [place] [facility];  Service: Cardiovascular;  Laterality: N/A;  . CARDIAC CATHETERIZATION  2005   [place] N/A 07/18/2017   Procedure: CARDIOVERSION;  Surgeon: Teodoro Spray, MD;  Location: [facility];  Service: Cardiovascular;  Laterality: N/A;  . CATARACT EXTRACTION  06/2002  . COLONOSCOPY    . COLONOSCOPY WITH PROPOFOL N/A 05/03/2017   Procedure: COLONOSCOPY WITH PROPOFOL;  Surgeon: Manya Silvas, MD;  Location: [facility];  Service: Endoscopy;  Laterality: N/A;  . ESOPHAGOGASTRODUODENOSCOPY (EGD) WITH PROPOFOL N/A 01/08/2016   Procedure: ESOPHAGOGASTRODUODENOSCOPY (EGD) WITH PROPOFOL;  Surgeon: Manya Silvas, MD;   Location: [facility] ENDOSCOPY;  Service: Endoscopy;  Laterality: N/A;  . KNEE ARTHROSCOPY Right   . LUMBAR LAMINECTOMY/DECOMPRESSION MICRODISCECTOMY N/A 01/09/2017   Procedure: LAMINECTOMY AND FORAMINOTOMY LUMBAR THREE-FOUR LUMBAR FOUR-FIVE  LUMBAR FIVE SACRAL ONE;  Surgeon: Newman Pies, MD;  Location: [place];  Service: Neurosurgery;  Laterality: N/A;  . SHOULDER SURGERY Left 2010  . TOTAL SHOULDER ARTHROPLASTY Left 03/01/2016   Procedure: REVERSE TOTAL SHOULDER ARTHROPLASTY;  Surgeon: Marchia Bond, MD;  Location: [place];  Service: Orthopedics;  Laterality: Left;    ROS- all systems are reviewed and negatives except as per HPI above  Current Outpatient Medications  Medication Sig Dispense Refill  . atorvastatin (LIPITOR) 10 MG tablet TAKE ONE TABLET BY MOUTH EVERY OTHER DAY 45 tablet 11  . Cholecalciferol (VITAMIN D3) 5000 units TABS Take 5,000 Units by mouth daily.    . Coenzyme Q10 (CO Q 10) 100 MG CAPS Take 100 mg by mouth 2 (two) times daily.     . diphenhydramine-acetaminophen (TYLENOL PM) 25-500 MG TABS tablet Take 2 tablets by mouth at bedtime as needed (sleep).    Marland Kitchen ketoconazole (NIZORAL) 2 % cream Apply 1 application topically every other day.    . Misc Natural Products (GLUCOSAMINE CHONDROITIN TRIPLE) TABS Take 1 tablet by mouth 2 (two) times daily.    Marland Kitchen omeprazole (PRILOSEC) 20 MG capsule TAKE 1 CAPSULE BY MOUTH EVERY DAY 90 capsule 3  . rivaroxaban (XARELTO) 20 MG TABS tablet Take 20 mg by mouth daily with supper.    . sildenafil (REVATIO) 20 MG tablet Take  1 tablet (20 mg total) by mouth 3 (three) times daily as needed (ED). 90 tablet 3  . telmisartan (MICARDIS) 20 MG tablet Take 20 mg by mouth daily.      No current facility-administered medications for this visit.     Physical Exam: Vitals:   04/23/18 1024  BP: 132/78  Pulse: 60  SpO2: 99%  Weight: 170 lb 12.8 oz (77.5 kg)  Height: 5\' 8"  (1.727 m)    GEN- The patient is well appearing, alert and oriented x 3 today.    Head- normocephalic, atraumatic Eyes-  Sclera clear, conjunctiva pink Ears- hearing intact Oropharynx- clear Lungs- Clear to ausculation bilaterally, normal work of breathing Heart- Regular rate and rhythm, no murmurs, rubs or gallops, PMI not laterally displaced GI- soft, NT, ND, + BS Extremities- no clubbing, cyanosis, or edema  Wt Readings from Last 3 Encounters:  04/23/18 170 lb 12.8 oz (77.5 kg)  03/27/18 172 lb (78 kg)  01/17/18 174 lb (78.9 kg)    EKG tracing ordered today is personally reviewed and shows sinus rhythm 60 bpm, PR 262 msec, otherwise normal ekg  Assessment and Plan:  1. Persistent afib/ atrial flutter Doing well post ablation off amiodarone On xarelto for chads2vasc score of 4 (given age, EF, and concerns for prior stroke). He worries about bleeding risks long term on [place].  He is aware that guidelines would advise that he continue anticoagulation.  We did discuss allows for long term monitoring to guide patients with afib of low burden.  I discussed REACT AF trial as well as implantable loop recorder.  At this time, he is clear in his preference to proceed with an ILR.  Risks and benefits of the procedure were discussed with the patient who wishes to proceed.  We will proceed at the next available time.  2. Nonischemic CM Echo 02/22/18 reveals EF 45%, mild MR, mild TR  Return in 1 week for ILR Return to see me in 3 months for follow-up post implant.  Thompson Grayer MD, [facility] 04/23/2018 10:37 AM

## 2018-04-23 NOTE — Patient Instructions (Addendum)
Medication Instructions:  Your physician recommends that you continue on your current medications as directed. Please refer to the Current Medication list given to you today.  Labwork: None ordered.  Testing/Procedures:  You will come to the office next Monday for a loop recorder implant. April 30, 2018 at 8:00 am  Follow-Up: Your physician wants you to follow-up in: 3 months with Dr. Rayann Heman.       Any Other Special Instructions Will Be Listed Below (If Applicable).  If you need a refill on your cardiac medications before your next appointment, please call your pharmacy.     Implantable Loop Recorder Placement  An implantable loop recorder is a small electronic device that is placed under the skin of your chest. It is about the size of an AA ("double A") battery. The device records the electrical activity of your heart over a long period of time. Your health care provider can download these recordings to monitor your heart. You may need an implantable loop recorder if you have periods of abnormal heart activity (arrhythmias) or unexplained fainting (syncope). The recorder can be left in place for 1 year or longer. Tell a health care provider about:  Any allergies you have.  All medicines you are taking, including vitamins, herbs, eye drops, creams, and over-the-counter medicines.  Any problems you or family members have had with anesthetic medicines.  Any blood disorders you have.  Any surgeries you have had.  Any medical conditions you have.  Whether you are pregnant or may be pregnant. What are the risks? Generally, this is a safe procedure. However, problems may occur, including:  Infection.  Bleeding.  Allergic reactions to anesthetic medicines.  Damage to nerves or blood vessels.  Failure of the device to work. This could require another surgery to replace it. What happens before the procedure?   You may have a physical exam, blood tests, and imaging  tests of your heart, such as a chest X-ray.  Follow instructions from your health care provider about eating or drinking restrictions.  Ask your health care provider about: ? Changing or stopping your regular medicines. This is especially important if you are taking diabetes medicines or blood thinners. ? Taking medicines such as aspirin and ibuprofen. These medicines can thin your blood. Do not take these medicines unless your health care provider tells you to take them. ? Taking over-the-counter medicines, vitamins, herbs, and supplements.  Ask your health care provider how your surgical site will be marked or identified.  Ask your health care provider what steps will be taken to help prevent infection. These may include: ? Removing hair at the surgery site. ? Washing skin with a germ-killing soap.  Plan to have someone take you home from the hospital or clinic.  Plan to have a responsible adult care for you for at least 24 hours after you leave the hospital or clinic. This is important.  Do not use any products that contain nicotine or tobacco, such as cigarettes and e-cigarettes. If you need help quitting, ask your health care provider. What happens during the procedure?  An IV will be inserted into one of your veins.  You may be given one or more of the following: ? A medicine to help you relax (sedative). ? A medicine to numb the area (local anesthetic).  A small incision will be made on the left side of your upper chest.  A pocket will be created under your skin.  The device will be placed in the  pocket.  The incision will be closed with stitches (sutures) or adhesive strips.  A bandage (dressing) will be placed over the incision. The procedure may vary among health care providers and hospitals. What happens after the procedure?  Your blood pressure, heart rate, breathing rate, and blood oxygen level will be monitored until you leave the hospital or clinic.  You may  be able to go home on the day of your surgery. Before you go home: ? Your health care provider will program your recorder. ? You will learn how to trigger your device with a handheld activator. ? You will learn how to send recordings to your health care provider. ? You will get an ID card for your device, and you will be told when to use it.  Do not drive for 24 hours if you were given a sedative during your procedure. Summary  An implantable loop recorder is a small electronic device that is placed under the skin of your chest to monitor your heart over a long period of time.  The recorder can be left in place for 1 year or longer.  Plan to have someone take you home from the hospital or clinic. This information is not intended to replace advice given to you by your health care provider. Make sure you discuss any questions you have with your health care provider. Document Released: 02/09/2015 Document Revised: 04/15/2017 Document Reviewed: 04/15/2017 Elsevier Interactive Patient Education  2019 [employer].

## 2018-04-27 ENCOUNTER — Encounter: Payer: Self-pay | Admitting: Internal Medicine

## 2018-04-30 ENCOUNTER — Ambulatory Visit: Payer: PPO | Admitting: Internal Medicine

## 2018-04-30 ENCOUNTER — Encounter: Payer: Self-pay | Admitting: Internal Medicine

## 2018-04-30 VITALS — BP 132/64 | HR 60 | Ht 68.0 in | Wt 168.6 lb

## 2018-04-30 DIAGNOSIS — I483 Typical atrial flutter: Secondary | ICD-10-CM

## 2018-04-30 DIAGNOSIS — R002 Palpitations: Secondary | ICD-10-CM

## 2018-04-30 DIAGNOSIS — I4819 Other persistent atrial fibrillation: Secondary | ICD-10-CM

## 2018-04-30 HISTORY — PX: OTHER SURGICAL HISTORY: SHX169

## 2018-04-30 NOTE — Patient Instructions (Addendum)
Medication Instructions:  Your physician recommends that you continue on your current medications as directed. Please refer to the Current Medication list given to you today.  Labwork: None ordered.  Testing/Procedures: None ordered.  Follow-Up:  You have a wound check scheduled for May 10, 2018 at 9:30 am at the [facility] office.  You will follow up with Dr. Rayann Heman on Jul 30, 2018 at 2:45 pm.     Implantable Loop Recorder Placement, Care After This sheet gives you information about how to care for yourself after your procedure. Your health care provider may also give you more specific instructions. If you have problems or questions, contact your health care provider. What can I expect after the procedure? After the procedure, it is common to have:  Soreness or discomfort near the incision.  Some swelling or bruising near the incision. Follow these instructions at home: Incision care   Follow instructions from your health care provider about how to take care of your incision. Make sure you: ? Wash your hands with soap and water before you change your bandage (dressing). If soap and water are not available, use hand sanitizer. ? Change your dressing as told by your health care provider. ? Keep your dressing dry. ? Leave stitches (sutures), skin glue, or adhesive strips in place. These skin closures may need to stay in place for 2 weeks or longer. If adhesive strip edges start to loosen and curl up, you may trim the loose edges. Do not remove adhesive strips completely unless your health care provider tells you to do that.  Check your incision area every day for signs of infection. Check for: ? Redness, swelling, or pain. ? Fluid or blood. ? Warmth. ? Pus or a bad smell.  Do not take baths, swim, or use a hot tub until your health care provider approves. Ask your health care provider if you can take showers. Activity   Return to your normal activities as told by your  health care provider. Ask your health care provider what activities are safe for you.  Do not drive for 24 hours if you were given a sedative during your procedure. General instructions  Follow instructions from your health care provider about how to manage your implantable loop recorder and transmit the information. Learn how to activate a recording if this is necessary for your type of device.  Do not go through a metal detection gate, and do not let someone hold a metal detector over your chest. Show your ID card.  Do not have an MRI unless you check with your health care provider first.  Take over-the-counter and prescription medicines only as told by your health care provider.  Keep all follow-up visits as told by your health care provider. This is important. Contact a health care provider if:  You have redness, swelling, or pain around your incision.  You have a fever.  You have pain that is not relieved by your pain medicine.  You have triggered your device because of fainting (syncope) or because of a heartbeat that feels like it is racing, slow, fluttering, or skipping (palpitations). Get help right away if you have:  Chest pain.  Difficulty breathing. Summary  After the procedure, it is common to have soreness or discomfort near the incision.  Change your dressing as told by your health care provider.  Follow instructions from your health care provider about how to manage your implantable loop recorder and transmit the information.  Keep all follow-up visits  as told by your health care provider. This is important. This information is not intended to replace advice given to you by your health care provider. Make sure you discuss any questions you have with your health care provider. Document Released: 02/09/2015 Document Revised: 04/15/2017 Document Reviewed: 04/15/2017 Elsevier Interactive Patient Education  2019 [employer].

## 2018-04-30 NOTE — Progress Notes (Signed)
SURGEON:  Thompson Grayer, MD     PREPROCEDURE DIAGNOSIS:  Atrial fibrillation, palpitations    POSTPROCEDURE DIAGNOSIS:  Atrial fibrillation, palpitations     PROCEDURES:   1. Implantable loop recorder implantation    INTRODUCTION:  Jeffery Davis is a 73 y.o. male with a history of palpitations and atrial fibrillation who presents today for implantable loop implantation.  The patient has had difficult to manage atrial fibrillation with frequent palpitations.   The patient is also s/p prior atrial fibrillation ablation.   he has worn telemetry previously during which he did not have arrhythmias.  There is significant concern for possible atrial fibrillation as the cause for the palpitations.  In addition long term afib management is felt to require additional monitoring.  The patient therefore presents today for implantable loop implantation.     DESCRIPTION OF PROCEDURE:  Informed written consent was obtained.  The patient required no sedation for the procedure today.  Mapping over the patient's chest was performed to identify the area where electrograms were most prominent for ILR recording.  This area was found to be the left parasternal region over the 3rd-4th intercostal space. The patients left chest was therefore prepped and draped in the usual sterile fashion. The skin overlying the left parasternal region was infiltrated with lidocaine for local analgesia.  A 0.5-cm incision was made over the left parasternal region over the 3rd intercostal space.  A subcutaneous ILR pocket was fashioned using a combination of sharp and blunt dissection.  A Medtronic Reveal Linq model [id number] implantable loop recorder was then placed into the pocket  R waves were very prominent and measured >0.59mV.  Steri- Strips and a sterile dressing were then applied.  There were no early apparent complications.  Device SN [id number]    CONCLUSIONS:   1. Successful implantation of a Medtronic Reveal LINQ implantable loop  recorder for palpitations and recurrent symptoms of atrial fibrillation  2. No early apparent complications.   Thompson Grayer MD, [facility] 04/30/2018 8:49 AM

## 2018-05-10 ENCOUNTER — Ambulatory Visit (INDEPENDENT_AMBULATORY_CARE_PROVIDER_SITE_OTHER): Payer: PPO | Admitting: *Deleted

## 2018-05-10 DIAGNOSIS — I4819 Other persistent atrial fibrillation: Secondary | ICD-10-CM

## 2018-05-10 LAB — CUP PACEART INCLINIC DEVICE CHECK
Date Time Interrogation Session: 20200227095446
Implantable Pulse Generator Implant Date: 20200217

## 2018-05-10 NOTE — Progress Notes (Signed)
Wound check appointment. Steri-strips removed. Wound without redness or edema. Incision edges approximated, wound well healed. Normal device function. Battery status: good. R-waves 0.24mV. No symptom, tachy, or AF episodes. Pause and brady detection off at implant. Patient educated about wound care and Carelink monitor. Monthly summary reports and ROV with JA on 07/30/18.

## 2018-05-14 DIAGNOSIS — R270 Ataxia, unspecified: Secondary | ICD-10-CM | POA: Diagnosis not present

## 2018-05-14 DIAGNOSIS — I480 Paroxysmal atrial fibrillation: Secondary | ICD-10-CM | POA: Diagnosis not present

## 2018-05-14 DIAGNOSIS — I428 Other cardiomyopathies: Secondary | ICD-10-CM | POA: Diagnosis not present

## 2018-05-14 DIAGNOSIS — I10 Essential (primary) hypertension: Secondary | ICD-10-CM | POA: Diagnosis not present

## 2018-05-16 ENCOUNTER — Other Ambulatory Visit: Payer: Self-pay | Admitting: Family Medicine

## 2018-05-16 DIAGNOSIS — K219 Gastro-esophageal reflux disease without esophagitis: Secondary | ICD-10-CM

## 2018-05-28 ENCOUNTER — Other Ambulatory Visit: Payer: Self-pay | Admitting: Internal Medicine

## 2018-06-04 ENCOUNTER — Other Ambulatory Visit: Payer: Self-pay

## 2018-06-04 ENCOUNTER — Ambulatory Visit (INDEPENDENT_AMBULATORY_CARE_PROVIDER_SITE_OTHER): Payer: PPO | Admitting: *Deleted

## 2018-06-04 DIAGNOSIS — R002 Palpitations: Secondary | ICD-10-CM

## 2018-06-04 DIAGNOSIS — I4819 Other persistent atrial fibrillation: Secondary | ICD-10-CM

## 2018-06-04 LAB — CUP PACEART REMOTE DEVICE CHECK
Date Time Interrogation Session: 20200321134034
Implantable Pulse Generator Implant Date: 20200217

## 2018-06-12 NOTE — Progress Notes (Signed)
Carelink Summary Report / Loop Recorder 

## 2018-07-02 ENCOUNTER — Telehealth: Payer: Self-pay | Admitting: *Deleted

## 2018-07-02 NOTE — Telephone Encounter (Signed)
He can wait till the summer.

## 2018-07-02 NOTE — Telephone Encounter (Signed)
Patient had TSH done on 03/2018 and it was WNL. Did you want him to have this repeated? Please advise. Thanks!

## 2018-07-02 NOTE — Telephone Encounter (Signed)
Advised patient as below.  

## 2018-07-02 NOTE — Telephone Encounter (Signed)
Patient has a follow up appt 07/04/2018. Patient stated he was told at last visit that he needed to do labs before follow up to check TSH. Patient is requesting lab slip for this morning. Please advise?

## 2018-07-04 ENCOUNTER — Ambulatory Visit: Payer: Self-pay | Admitting: Family Medicine

## 2018-07-05 ENCOUNTER — Ambulatory Visit (INDEPENDENT_AMBULATORY_CARE_PROVIDER_SITE_OTHER): Payer: PPO | Admitting: *Deleted

## 2018-07-05 ENCOUNTER — Other Ambulatory Visit: Payer: Self-pay

## 2018-07-05 DIAGNOSIS — R002 Palpitations: Secondary | ICD-10-CM

## 2018-07-05 DIAGNOSIS — I480 Paroxysmal atrial fibrillation: Secondary | ICD-10-CM

## 2018-07-05 LAB — CUP PACEART REMOTE DEVICE CHECK
Date Time Interrogation Session: 20200423134228
Implantable Pulse Generator Implant Date: 20200217

## 2018-07-12 NOTE — Progress Notes (Signed)
Carelink Summary Report / Loop Recorder 

## 2018-07-25 ENCOUNTER — Telehealth: Payer: Self-pay

## 2018-07-25 DIAGNOSIS — M1732 Unilateral post-traumatic osteoarthritis, left knee: Secondary | ICD-10-CM | POA: Diagnosis not present

## 2018-07-26 NOTE — Telephone Encounter (Signed)
Spoke with pt regarding appt on 07/30/18. Pt stated he will check vitals day of appt. Pt questions and concerns were address.

## 2018-07-30 ENCOUNTER — Telehealth (INDEPENDENT_AMBULATORY_CARE_PROVIDER_SITE_OTHER): Payer: PPO | Admitting: Internal Medicine

## 2018-07-30 ENCOUNTER — Encounter: Payer: Self-pay | Admitting: Internal Medicine

## 2018-07-30 VITALS — BP 140/83 | HR 70 | Ht 68.0 in | Wt 163.6 lb

## 2018-07-30 DIAGNOSIS — I4819 Other persistent atrial fibrillation: Secondary | ICD-10-CM | POA: Diagnosis not present

## 2018-07-30 NOTE — Progress Notes (Signed)
Electrophysiology TeleHealth Note   Due to national recommendations of social distancing due to COVID 19, an audio/video telehealth visit is felt to be most appropriate for this patient at this time.  See MyChart message from today for the patient's consent to telehealth for [facility].   Date:  07/30/2018   ID:  Jeffery Davis, DOB 29-Mar-1945, MRN [id number]  Location: patient's home  Provider location: [address] [place]  Evaluation Performed: Follow-up visit  PCP:  Jerrol Banana., MD  Cardiologist:  Dr Ubaldo Glassing Electrophysiologist:  Dr Rayann Heman  Chief Complaint:  afib  History of Present Illness:    Jeffery Davis is a 73 y.o. male who presents via audio/video conferencing for a telehealth visit today.  Since last being seen in our clinic, the patient reports doing very well.  Today, he denies symptoms of palpitations, chest pain, shortness of breath,  lower extremity edema, dizziness, presyncope, or syncope.  The patient is otherwise without complaint today.  The patient denies symptoms of fevers, chills, cough, or new SOB worrisome for COVID 19.  Past Medical History:  Diagnosis Date  . Actinic keratosis   . Allergy   . Arthritis    degenerative  . Atrial fibrillation ([place])    s/p cardioversion 07/18/17  . Benign fibroma of prostate   . Carpal tunnel syndrome, right   . Erectile dysfunction   . GERD (gastroesophageal reflux disease)   . Hyperlipidemia   . Hypertension   . Lumbar herniated disc   . Lumbar stenosis with neurogenic claudication   . LV dysfunction   . Neural foraminal stenosis of lumbosacral spine   . Primary localized osteoarthrosis of left shoulder 03/01/2016  . Wears hearing aid in both ears     Past Surgical History:  Procedure Laterality Date  . ABLATION OF DYSRHYTHMIC FOCUS  10/17/2017  . ATRIAL FIBRILLATION ABLATION N/A 10/17/2017   Procedure: ATRIAL FIBRILLATION ABLATION;  Surgeon: Thompson Grayer, MD;  Location: [place] [facility];  Service: Cardiovascular;  Laterality: N/A;  . CARDIAC CATHETERIZATION  2005   [place] N/A 07/18/2017   Procedure: CARDIOVERSION;  Surgeon: Teodoro Spray, MD;  Location: [facility] ORS;  Service: Cardiovascular;  Laterality: N/A;  . CATARACT EXTRACTION  06/2002  . COLONOSCOPY    . COLONOSCOPY WITH PROPOFOL N/A 05/03/2017   Procedure: COLONOSCOPY WITH PROPOFOL;  Surgeon: Manya Silvas, MD;  Location: [facility] ENDOSCOPY;  Service: Endoscopy;  Laterality: N/A;  . ESOPHAGOGASTRODUODENOSCOPY (EGD) WITH PROPOFOL N/A 01/08/2016   Procedure: ESOPHAGOGASTRODUODENOSCOPY (EGD) WITH PROPOFOL;  Surgeon: Manya Silvas, MD;  Location: [facility] ENDOSCOPY;  Service: Endoscopy;  Laterality: N/A;  . Implantable loop recorder placement  04/30/2018   MDT Reveal LINQ implanted [facility] [id number]) by Dr Rayann Heman for afib management post ablation.  Marland Kitchen KNEE ARTHROSCOPY Right   . LUMBAR LAMINECTOMY/DECOMPRESSION MICRODISCECTOMY N/A 01/09/2017   Procedure: LAMINECTOMY AND FORAMINOTOMY LUMBAR THREE-FOUR LUMBAR FOUR-FIVE  LUMBAR FIVE SACRAL ONE;  Surgeon: Newman Pies, MD;  Location: [place];  Service: Neurosurgery;  Laterality: N/A;  . SHOULDER SURGERY Left 2010  . TOTAL SHOULDER ARTHROPLASTY Left 03/01/2016   Procedure: REVERSE TOTAL SHOULDER ARTHROPLASTY;  Surgeon: Marchia Bond, MD;  Location: [place];  Service: Orthopedics;  Laterality: Left;    Current Outpatient Medications  Medication Sig Dispense Refill  . atorvastatin (LIPITOR) 10 MG tablet TAKE ONE TABLET BY MOUTH EVERY OTHER DAY 45 tablet 11  . Cholecalciferol (VITAMIN D3) 5000 units TABS Take 5,000  Units by mouth daily.    . Coenzyme Q10 (CO Q 10) 100 MG CAPS Take 100 mg by mouth 2 (two) times daily.     . diphenhydramine-acetaminophen (TYLENOL PM) 25-500 MG TABS tablet Take 2 tablets by mouth at bedtime as needed (sleep).    Marland Kitchen ketoconazole (NIZORAL) 2 % cream Apply 1 application topically every other day.    . Misc Natural  Products (GLUCOSAMINE CHONDROITIN TRIPLE) TABS Take 1 tablet by mouth 2 (two) times daily.    Marland Kitchen omeprazole (PRILOSEC) 20 MG capsule TAKE 1 CAPSULE EVERY DAY (Patient taking differently: Take 20 mg by mouth every other day. TAKE 1 CAPSULE EVERY DAY) 90 capsule 3  . rivaroxaban (XARELTO) 20 MG TABS tablet Take 20 mg by mouth daily with supper.    . sildenafil (REVATIO) 20 MG tablet Take 1 tablet (20 mg total) by mouth 3 (three) times daily as needed (ED). 90 tablet 3  . telmisartan (MICARDIS) 20 MG tablet Take 20 mg by mouth daily.      No current facility-administered medications for this visit.     Allergies:   Patient has no known allergies.   Social History:  The patient  reports that he has never smoked. He has never used smokeless tobacco. He reports current alcohol use of about 4.0 - 6.0 standard drinks of alcohol per week. He reports that he does not use drugs.   Family History:  The patient's family history includes CAD in his father; Cancer in his mother; Healthy in his daughter and son; Hypertension in his father.   ROS:  Please see the history of present illness.   All other systems are personally reviewed and negative.    Exam:    Vital Signs:  BP 140/83   Pulse 70   Ht 5\' 8"  (1.727 m)   Wt 163 lb 9.6 oz (74.2 kg)   BMI 24.88 kg/m   Well appearing, alert and conversant, regular work of breathing,  good skin color Eyes- anicteric, neuro- grossly intact, skin- no apparent rash or lesions or cyanosis, mouth- oral mucosa is pink   Labs/Other Tests and Data Reviewed:    Recent Labs: 03/15/2018: ALT 25; BUN 22; Creatinine, Ser 1.02; Hemoglobin 14.3; Platelets 287; Potassium 4.5; Sodium 142; TSH 3.660   Wt Readings from Last 3 Encounters:  07/30/18 163 lb 9.6 oz (74.2 kg)  04/30/18 168 lb 9.6 oz (76.5 kg)  04/23/18 170 lb 12.8 oz (77.5 kg)     Other studies personally reviewed: Additional studies/ records that were reviewed today include: Dr Leamon Arnt notes, my prior notes   Review of the above records today demonstrates: as above Cardiac CT 10/10/17- calcium score 85%   Last device remote is reviewed from [facility] PDF dated 4/20 which reveals no afib. He did have episodes of NSVT for which he was asymptomatic   ASSESSMENT & PLAN:    1.  Persistent afib/ atrial flutter Doing very well post ablation off AAD therapy chads2vasc sore is at least 5.  He has had a prior stroke.  I have strongly advised life long anticoagulation as per [facility].  He is reluctant to stay on xarelto but states that he will continue it until our next appointment at which time, we will review ILR again.  No AF by ILR thus far  2. Nonischemic CM Echo 12/19- EF 45% Medical management per Dr Ubaldo Glassing  3. COVID 19 screen The patient denies symptoms of COVID 19 at this time.  The importance of  social distancing was discussed today  Follow-up:  4 months with me Next remote: 07/2018  Current medicines are reviewed at length with the patient today.   The patient does not have concerns regarding his medicines.  The following changes were made today:  none  Labs/ tests ordered today include:  No orders of the defined types were placed in this encounter.   Patient Risk:  after full review of this patients clinical status, I feel that they are at moderate risk at this time.  Today, I have spent 15 minutes with the patient with telehealth technology discussing afib .    Army Fossa, MD  07/30/2018 2:34 PM     [place] [address] [phone] (office) [phone] (fax)

## 2018-08-07 ENCOUNTER — Ambulatory Visit (INDEPENDENT_AMBULATORY_CARE_PROVIDER_SITE_OTHER): Payer: PPO | Admitting: *Deleted

## 2018-08-07 DIAGNOSIS — R002 Palpitations: Secondary | ICD-10-CM

## 2018-08-07 DIAGNOSIS — I4819 Other persistent atrial fibrillation: Secondary | ICD-10-CM

## 2018-08-07 LAB — CUP PACEART REMOTE DEVICE CHECK
Date Time Interrogation Session: 20200526133941
Implantable Pulse Generator Implant Date: 20200217

## 2018-08-15 NOTE — Progress Notes (Signed)
Carelink Summary Report / Loop Recorder 

## 2018-09-10 ENCOUNTER — Ambulatory Visit (INDEPENDENT_AMBULATORY_CARE_PROVIDER_SITE_OTHER): Payer: PPO | Admitting: *Deleted

## 2018-09-10 DIAGNOSIS — I480 Paroxysmal atrial fibrillation: Secondary | ICD-10-CM | POA: Diagnosis not present

## 2018-09-10 LAB — CUP PACEART REMOTE DEVICE CHECK
Date Time Interrogation Session: 20200628141039
Implantable Pulse Generator Implant Date: 20200217

## 2018-09-13 ENCOUNTER — Encounter: Payer: Self-pay | Admitting: Family Medicine

## 2018-09-13 ENCOUNTER — Ambulatory Visit (INDEPENDENT_AMBULATORY_CARE_PROVIDER_SITE_OTHER): Payer: PPO | Admitting: Family Medicine

## 2018-09-13 ENCOUNTER — Other Ambulatory Visit: Payer: Self-pay

## 2018-09-13 VITALS — BP 130/74 | HR 66 | Temp 98.1°F | Resp 16 | Wt 167.4 lb

## 2018-09-13 DIAGNOSIS — S6992XA Unspecified injury of left wrist, hand and finger(s), initial encounter: Secondary | ICD-10-CM

## 2018-09-13 DIAGNOSIS — I480 Paroxysmal atrial fibrillation: Secondary | ICD-10-CM | POA: Diagnosis not present

## 2018-09-13 NOTE — Progress Notes (Signed)
Patient: Jeffery Davis Adult    DOB: 10-Aug-1945   73 y.o.   MRN: [id number] Visit Date: 09/13/2018  Today's Provider: [name], MD   Chief Complaint  Patient presents with  . Laceration   Subjective:     Laceration  The incident occurred more than 1 week ago. The laceration is located on the left hand. The laceration mechanism was a blunt object (screwdriver). The pain is at a severity of 2/10. The pain is mild. The pain has been intermittent since onset. He reports no foreign bodies present. His tetanus status is UTD (10/06/2011).  He has fleshy tissue growing over lateral portion of proximal nail bed.  No Known Allergies   Current Outpatient Medications:  .  atorvastatin (LIPITOR) 10 MG tablet, TAKE ONE TABLET BY MOUTH EVERY OTHER DAY, Disp: 45 tablet, Rfl: 11 .  Cholecalciferol (VITAMIN D3) 5000 units TABS, Take 5,000 Units by mouth daily., Disp: , Rfl:  .  Coenzyme Q10 (CO Q 10) 100 MG CAPS, Take 100 mg by mouth 2 (two) times daily. , Disp: , Rfl:  .  diphenhydramine-acetaminophen (TYLENOL PM) 25-500 MG TABS tablet, Take 2 tablets by mouth at bedtime as needed (sleep)., Disp: , Rfl:  .  ketoconazole (NIZORAL) 2 % cream, Apply 1 application topically every other day., Disp: , Rfl:  .  Misc Natural Products (GLUCOSAMINE CHONDROITIN TRIPLE) TABS, Take 1 tablet by mouth 2 (two) times daily., Disp: , Rfl:  .  omeprazole (PRILOSEC) 20 MG capsule, TAKE 1 CAPSULE EVERY DAY (Patient taking differently: Take 20 mg by mouth every other day. TAKE 1 CAPSULE EVERY DAY), Disp: 90 capsule, Rfl: 3 .  rivaroxaban (XARELTO) 20 MG TABS tablet, Take 20 mg by mouth daily with supper., Disp: , Rfl:  .  sildenafil (REVATIO) 20 MG tablet, Take 1 tablet (20 mg total) by mouth 3 (three) times daily as needed (ED)., Disp: 90 tablet, Rfl: 3 .  telmisartan (MICARDIS) 20 MG tablet, Take 20 mg by mouth daily. , Disp: , Rfl:   Review of Systems  Constitutional: Negative.  Negative for activity  change, appetite change, chills, diaphoresis, fatigue, fever and unexpected weight change.  HENT: Negative.   Eyes: Negative.   Respiratory: Negative.  Negative for cough and shortness of breath.   Cardiovascular: Negative.  Negative for chest pain, palpitations and leg swelling.  Gastrointestinal: Negative.   Endocrine: Negative.   Genitourinary: Negative.   Musculoskeletal: Negative.  Negative for arthralgias and back pain.  Allergic/Immunologic: Negative.   Neurological: Negative.  Negative for dizziness, light-headedness and headaches.  Hematological: Negative.   Psychiatric/Behavioral: Negative.  Negative for agitation, self-injury, sleep disturbance and suicidal ideas. The patient is not nervous/anxious.     Social History   Tobacco Use  . Smoking status: Never Smoker  . Smokeless tobacco: Never Used  Substance Use Topics  . Alcohol use: Yes    Alcohol/week: 4.0 - 6.0 standard drinks    Types: 4 - 6 Glasses of wine per week    Comment: 1-2 glasses of wine daily      Objective:   BP 130/74   Pulse 66   Temp 98.1 F (36.7 C) (Oral)   Resp 16   Wt 167 lb 6.4 oz (75.9 kg)   SpO2 98%   BMI 25.45 kg/m  Vitals:   09/13/18 1549  BP: 130/74  Pulse: 66  Resp: 16  Temp: 98.1 F (36.7 C)  TempSrc: Oral  SpO2: 98%  Weight: 167 lb 6.4 oz (75.9 kg)     Physical Exam Vitals signs reviewed.  Constitutional:      Appearance: He is well-developed.  HENT:     Head: Normocephalic and atraumatic.     Right Ear: External ear normal.     Left Ear: External ear normal.     Nose: Nose normal.  Eyes:     General: No scleral icterus.    Conjunctiva/sclera: Conjunctivae normal.     Comments: Right pupil 1 mm smaller than left.  Neck:     Thyroid: No thyromegaly.  Cardiovascular:     Rate and Rhythm: Normal rate and regular rhythm.     Heart sounds: Normal heart sounds.  Pulmonary:     Effort: Pulmonary effort is normal.     Breath sounds: Normal breath sounds.   Abdominal:     Palpations: Abdomen is soft.  Lymphadenopathy:     Cervical: No cervical adenopathy.  Skin:    General: Skin is warm and dry.     Comments: Fleshy growth over lateral nail bed. Neurovascular intact.  Neurological:     General: No focal deficit present.     Mental Status: He is alert and oriented to person, place, and time. Mental status is at baseline.  Psychiatric:        Mood and Affect: Mood normal.        Behavior: Behavior normal.        Thought Content: Thought content normal.        Judgment: Judgment normal.      No results found for any visits on 09/13/18.     Assessment & Plan    1. Injury of left hand, initial encounter  Left thumb injurty--refer to Dr Veronia Beets.  - Ambulatory referral to Orthopedic Surgery 2.Afib    [name], MD  [facility] Fritzi Mandes [place] as a scribe for [name], MD.,have documented all relevant documentation on the behalf of [name], MD,as directed by  [name], MD while in the presence of [name], MD.

## 2018-09-18 ENCOUNTER — Other Ambulatory Visit: Payer: Self-pay

## 2018-09-18 DIAGNOSIS — S6992XA Unspecified injury of left wrist, hand and finger(s), initial encounter: Secondary | ICD-10-CM

## 2018-09-18 NOTE — Progress Notes (Signed)
Carelink Summary Report / Loop Recorder 

## 2018-09-20 DIAGNOSIS — S61012A Laceration without foreign body of left thumb without damage to nail, initial encounter: Secondary | ICD-10-CM | POA: Diagnosis not present

## 2018-09-20 DIAGNOSIS — L923 Foreign body granuloma of the skin and subcutaneous tissue: Secondary | ICD-10-CM | POA: Diagnosis not present

## 2018-09-20 DIAGNOSIS — M79645 Pain in left finger(s): Secondary | ICD-10-CM | POA: Diagnosis not present

## 2018-10-01 ENCOUNTER — Other Ambulatory Visit: Payer: Self-pay | Admitting: Family Medicine

## 2018-10-01 DIAGNOSIS — N529 Male erectile dysfunction, unspecified: Secondary | ICD-10-CM

## 2018-10-08 ENCOUNTER — Encounter: Payer: Self-pay | Admitting: Family Medicine

## 2018-10-08 ENCOUNTER — Other Ambulatory Visit: Payer: Self-pay

## 2018-10-08 ENCOUNTER — Ambulatory Visit (INDEPENDENT_AMBULATORY_CARE_PROVIDER_SITE_OTHER): Payer: PPO | Admitting: Family Medicine

## 2018-10-08 ENCOUNTER — Ambulatory Visit (INDEPENDENT_AMBULATORY_CARE_PROVIDER_SITE_OTHER): Payer: PPO

## 2018-10-08 VITALS — BP 129/62 | HR 59 | Temp 97.9°F | Resp 18 | Wt 164.4 lb

## 2018-10-08 DIAGNOSIS — K2270 Barrett's esophagus without dysplasia: Secondary | ICD-10-CM

## 2018-10-08 DIAGNOSIS — Z0000 Encounter for general adult medical examination without abnormal findings: Secondary | ICD-10-CM | POA: Diagnosis not present

## 2018-10-08 LAB — POCT URINALYSIS DIPSTICK
Bilirubin, UA: NEGATIVE
Blood, UA: NEGATIVE
Glucose, UA: NEGATIVE
Ketones, UA: NEGATIVE
Leukocytes, UA: NEGATIVE
Nitrite, UA: NEGATIVE
Protein, UA: NEGATIVE
Spec Grav, UA: 1.025 (ref 1.010–1.025)
Urobilinogen, UA: 0.2 E.U./dL
pH, UA: 5 (ref 5.0–8.0)

## 2018-10-08 NOTE — Progress Notes (Signed)
Subjective:   Jeffery Davis is a 73 y.o. male who presents for Medicare Annual/Subsequent preventive examination.    This visit is being conducted through telemedicine due to the COVID-19 pandemic. This patient has given me verbal consent via doximity to conduct this visit, patient states they are participating from their home address. Some vital signs may be absent or patient reported.    Patient identification: identified by name, DOB, and current address  Review of Systems:  N/A  Cardiac Risk Factors include: advanced age (>15men, >60 women);dyslipidemia;hypertension;male gender     Objective:    Vitals: There were no vitals taken for this visit.  There is no height or weight on file to calculate BMI. Unable to obtain vitals due to visit being conducted via telephonically.   Advanced Directives 10/08/2018 10/17/2017 10/03/2017 08/01/2017 07/18/2017 06/05/2017 05/03/2017  Does Patient Have a Medical Advance Directive? Yes Yes Yes Yes No Yes Yes  Type of Paramedic of [place];Living will [place];Living will [place];Living will Living will - [place];Living will [place];Living will  Does patient want to make changes to medical advance directive? - No - Patient declined - No - Patient declined - - -  Copy of [place] in Chart? Yes - validated most recent copy scanned in chart (See row information) No - copy requested Yes - - Yes Yes  Would patient like information on creating a medical advance directive? - - - No - Patient declined No - Patient declined - -    Tobacco Social History   Tobacco Use  Smoking Status Never Smoker  Smokeless Tobacco Never Used     Counseling given: Not Answered   Clinical Intake:  Pre-visit preparation completed: Yes  Pain Score: 0-No pain     Nutritional Risks: None Diabetes: No  How often do you need to have someone  help you when you read instructions, pamphlets, or other written materials from your doctor or pharmacy?: 1 - Never  Interpreter Needed?: No  Information entered by :: [facility], LPN  Past Medical History:  Diagnosis Date  . Actinic keratosis   . Allergy   . Arthritis    degenerative  . Atrial fibrillation ([place])    s/p cardioversion 07/18/17  . Benign fibroma of prostate   . Carpal tunnel syndrome, right   . Erectile dysfunction   . GERD (gastroesophageal reflux disease)   . Hyperlipidemia   . Hypertension   . Lumbar herniated disc   . Lumbar stenosis with neurogenic claudication   . LV dysfunction   . Neural foraminal stenosis of lumbosacral spine   . Primary localized osteoarthrosis of left shoulder 03/01/2016  . Wears hearing aid in both ears    Past Surgical History:  Procedure Laterality Date  . ABLATION OF DYSRHYTHMIC FOCUS  10/17/2017  . ATRIAL FIBRILLATION ABLATION N/A 10/17/2017   Procedure: ATRIAL FIBRILLATION ABLATION;  Surgeon: Thompson Grayer, MD;  Location: [facility];  Service: Cardiovascular;  Laterality: N/A;  . CARDIAC CATHETERIZATION  2005   [place] N/A 07/18/2017   Procedure: CARDIOVERSION;  Surgeon: Teodoro Spray, MD;  Location: [facility];  Service: Cardiovascular;  Laterality: N/A;  . CATARACT EXTRACTION  06/2002  . COLONOSCOPY    . COLONOSCOPY WITH PROPOFOL N/A 05/03/2017   Procedure: COLONOSCOPY WITH PROPOFOL;  Surgeon: Manya Silvas, MD;  [facility] END[facility];  Service: Endoscopy;  Laterality: N/A;  .  ESOPHAGOGASTRODUODENOSCOPY (EGD) WITH PROPOFOL N/A 01/08/2016   Procedure: ESOPHAGOGASTRODUODENOSCOPY (EGD) WITH PROPOFOL;  Surgeon: Manya Silvas, MD;  Location: [facility] ENDOSCOPY;  Service: Endoscopy;  Laterality: N/A;  . HAND SURGERY    . Implantable loop recorder placement  04/30/2018   MDT Reveal LINQ implanted [facility] [id number]) by Dr Rayann Heman for afib management post ablation.  Marland Kitchen KNEE ARTHROSCOPY Right   . LUMBAR  LAMINECTOMY/DECOMPRESSION MICRODISCECTOMY N/A 01/09/2017   Procedure: LAMINECTOMY AND FORAMINOTOMY LUMBAR THREE-FOUR LUMBAR FOUR-FIVE  LUMBAR FIVE SACRAL ONE;  Surgeon: Newman Pies, MD;  Location: [place];  Service: Neurosurgery;  Laterality: N/A;  . SHOULDER SURGERY Left 2010  . TOTAL SHOULDER ARTHROPLASTY Left 03/01/2016   Procedure: REVERSE TOTAL SHOULDER ARTHROPLASTY;  Surgeon: Marchia Bond, MD;  Location: [place];  Service: Orthopedics;  Laterality: Left;   Family History  Problem Relation Age of Onset  . Cancer Mother        breast and pancreatic  . CAD Father   . Hypertension Father   . Healthy Daughter   . Healthy Son    Social History   Socioeconomic History  . Marital status: Married    Spouse name: Coralyn Mark  . Number of children: 2  . Years of education: bachelors  . Highest education level: Bachelor's degree (e.g., BA, AB, BS)  Occupational History  . Occupation: Retired     Comment: worked at [employer] 12 years.     Employer: [employer]  Social Needs  . Financial resource strain: Not hard at all  . Food insecurity    Worry: Never true    Inability: Never true  . Transportation needs    Medical: No    Non-medical: No  Tobacco Use  . Smoking status: Never Smoker  . Smokeless tobacco: Never Used  Substance and Sexual Activity  . Alcohol use: Yes    Alcohol/week: 7.0 - 14.0 standard drinks    Types: 7 - 14 Glasses of wine per week    Comment: 1-2 glasses of wine daily  . Drug use: No  . Sexual activity: Not on file  Lifestyle  . Physical activity    Days per week: 7 days    Minutes per session: 60 min  . Stress: Only a little  Relationships  . Social Herbalist on phone: Patient refused    Gets together: Patient refused    Attends religious service: Patient refused    Active member of club or organization: Patient refused    Attends meetings of clubs or organizations: Patient refused    Relationship status: Patient  refused  Other Topics Concern  . Not on file  Social History Narrative  . Not on file    Outpatient Encounter Medications as of 10/08/2018  Medication Sig  . atorvastatin (LIPITOR) 10 MG tablet TAKE ONE TABLET BY MOUTH EVERY OTHER DAY  . Cholecalciferol (VITAMIN D3) 5000 units TABS Take 5,000 Units by mouth daily.  . Coenzyme Q10 (CO Q 10) 100 MG CAPS Take 100 mg by mouth 2 (two) times daily.   . diphenhydramine-acetaminophen (TYLENOL PM) 25-500 MG TABS tablet Take 2 tablets by mouth at bedtime as needed (sleep).  Marland Kitchen ketoconazole (NIZORAL) 2 % cream Apply 1 application topically every other day.  . Misc Natural Products (GLUCOSAMINE CHONDROITIN TRIPLE) TABS Take 1 tablet by mouth 2 (two) times daily.  Marland Kitchen omeprazole (PRILOSEC) 20 MG capsule TAKE 1 CAPSULE EVERY DAY (Patient taking differently: Take 20 mg by mouth every  other day. TAKE 1 CAPSULE EVERY DAY)  . rivaroxaban (XARELTO) 20 MG TABS tablet Take 20 mg by mouth daily with supper.  . sildenafil (REVATIO) 20 MG tablet TAKE ONE TABLET BY MOUTH 3 TIMES DAILY AS NEEDED  . telmisartan (MICARDIS) 20 MG tablet Take 20 mg by mouth daily.    No facility-administered encounter medications on file as of 10/08/2018.     Activities of Daily Living In your present state of health, do you have any difficulty performing the following activities: 10/08/2018 10/17/2017  Hearing? N N  Vision? N N  Difficulty concentrating or making decisions? N N  Walking or climbing stairs? N N  Dressing or bathing? N N  Doing errands, shopping? N N  Preparing Food and eating ? N -  Using the Toilet? N -  In the past six months, have you accidently leaked urine? N -  Do you have problems with loss of bowel control? N -  Managing your Medications? N -  Managing your Finances? N -  Housekeeping or managing your Housekeeping? N -  Some recent data might be hidden    Patient Care Team: Jerrol Banana., MD as PCP - General (Family Medicine) Idelle Leech, [place]  as Consulting Physician (Optometry) Ralene Bathe, MD as Consulting Physician (Dermatology) Ubaldo Glassing Javier Docker, MD as Consulting Physician (Cardiology) Thompson Grayer, MD as Consulting Physician (Cardiology) Newman Pies, MD as Consulting Physician (Neurosurgery) Marchia Bond, MD as Consulting Physician (Orthopedic Surgery)   Assessment:   This is a routine wellness examination for [place].  Exercise Activities and Dietary recommendations Current Exercise Habits: Home exercise routine, Type of exercise: strength training/weights;stretching;walking;Other - see comments(rides a bicycle), Time (Minutes): 60, Frequency (Times/Week): 7, Weekly Exercise (Minutes/Week): 420, Intensity: Moderate, Exercise limited by: None identified  Goals    . DIET - REDUCE PORTION SIZE     Recommend decreasing portion sizes by half and eating 3 small meals a day with two healthy snacks in between.     . Reduce salt intake to 2 grams per day or less     Reduce salt intake to 2 grams per day or less       Fall Risk: Fall Risk  10/08/2018 10/03/2017 09/27/2016 09/29/2015 11/10/2014  Falls in the past year? 0 No No Yes No  Number falls in past yr: - - - 1 -  Injury with Fall? - - - Yes -  Comment - - - fell on treadmill -  Risk for fall due to : - - - History of fall(s) -  Follow up - - - Falls evaluation completed -    FALL RISK PREVENTION PERTAINING TO THE HOME:  Any stairs in or around the home? No  If so, are there any without handrails? N/A  Home free of loose throw rugs in walkways, pet beds, electrical cords, etc? Yes  Adequate lighting in your home to reduce risk of falls? Yes   ASSISTIVE DEVICES UTILIZED TO PREVENT FALLS:  Life alert? No  Use of a cane, walker or w/c? No  Grab bars in the bathroom? No  Shower chair or bench in shower? Yes  Elevated toilet seat or a handicapped toilet? Yes   TIMED UP AND GO:  Was the test performed? No .    Depression Screen PHQ 2/9 Scores 10/08/2018  10/03/2017 10/03/2017 09/27/2016  PHQ - 2 Score 0 0 0 0  PHQ- 9 Score - 1 - 0    Cognitive Function: Declined today.  6CIT Screen 09/27/2016  What Year? 0 points  What month? 0 points  What time? 0 points  Count back from 20 0 points  Months in reverse 0 points  Repeat phrase 2 points  Total Score 2    Immunization History  Administered Date(s) Administered  . Influenza, High Dose Seasonal PF 01/08/2015, 12/08/2015, 12/30/2016  . Influenza,inj,Quad PF,6+ Mos 01/07/2014  . Influenza-Unspecified 12/31/2017  . Pneumococcal Conjugate-13 11/10/2014  . Pneumococcal Polysaccharide-23 10/06/2011  . Td 12/22/2003  . Tdap 10/06/2011  . Zoster 03/14/2010    Qualifies for Shingles Vaccine? Yes  Zostavax completed in 2012. Due for Shingrix. Education has been provided regarding the importance of this vaccine. Pt has been advised to call insurance company to determine out of pocket expense. Advised may also receive vaccine at local pharmacy or Health Dept. Verbalized acceptance and understanding.  Tdap: Up to date  Flu Vaccine: Up to date  Pneumococcal Vaccine: Completed series  Screening Tests Health Maintenance  Topic Date Due  . MAMMOGRAM  09/12/1995  . DEXA SCAN  09/12/2010  . INFLUENZA VACCINE  10/13/2018  . COLONOSCOPY  05/03/2020  . TETANUS/TDAP  10/05/2021  . Hepatitis C Screening  Completed  . PNA vac Low Risk Adult  Completed   Cancer Screenings:  Colorectal Screening: Completed 06/06/16. Repeat every 3 years.  Lung Cancer Screening: (Low Dose CT Chest recommended if Age 30-80 years, 30 pack-year currently smoking OR have quit w/in 15years.) does not qualify.   Additional Screening:  Hepatitis C Screening: Up to date  Vision Screening: Recommended annual ophthalmology exams for early detection of glaucoma and other disorders of the eye.  Dental Screening: Recommended annual dental exams for proper oral hygiene  Community Resource Referral:  CRR required  this visit?  No        Plan:  I have personally reviewed and addressed the Medicare Annual Wellness questionnaire and have noted the following in the patient's chart:  A. Medical and social history B. Use of alcohol, tobacco or illicit drugs  C. Current medications and supplements D. Functional ability and status E.  Nutritional status F.  Physical activity G. Advance directives H. List of other physicians I.  Hospitalizations, surgeries, and ER visits in previous 12 months J.  [place] such as hearing and vision if needed, cognitive and depression L. Referrals and appointments   In addition, I have reviewed and discussed with patient certain preventive protocols, quality metrics, and best practice recommendations. A written personalized care plan for preventive services as well as general preventive health recommendations were provided to patient.   [place] Score, [place]  5/99/3570 Nurse Health Advisor   Nurse Notes: None.

## 2018-10-08 NOTE — Patient Instructions (Signed)
Jeffery Davis , Thank you for taking time to come for your Medicare Wellness Visit. I appreciate your ongoing commitment to your health goals. Please review the following plan we discussed and let me know if I can assist you in the future.   Screening recommendations/referrals: Colonoscopy: Up to date, due 05/2020 Recommended yearly ophthalmology/optometry visit for glaucoma screening and checkup Recommended yearly dental visit for hygiene and checkup  Vaccinations: Influenza vaccine: Up to date Pneumococcal vaccine: Completed series Tdap vaccine: Up to date, due 09/2021 Shingles vaccine: Pt declines today.     Advanced directives: Currently on file.  Conditions/risks identified: Continue to work on cutting back portion sizes and salt intake.   Next appointment: 10:00 today with Dr Rosanna Randy  Preventive Care 73 Years and Older, Male Preventive care refers to lifestyle choices and visits with your health care provider that can promote health and wellness. What does preventive care include?  A yearly physical exam. This is also called an annual well check.  Dental exams once or twice a year.  Routine eye exams. Ask your health care provider how often you should have your eyes checked.  Personal lifestyle choices, including:  Daily care of your teeth and gums.  Regular physical activity.  Eating a healthy diet.  Avoiding tobacco and drug use.  Limiting alcohol use.  Practicing safe sex.  Taking low doses of aspirin every day.  Taking vitamin and mineral supplements as recommended by your health care provider. What happens during an annual well check? The services and screenings done by your health care provider during your annual well check will depend on your age, overall health, lifestyle risk factors, and family history of disease. Counseling  Your health care provider may ask you questions about your:  Alcohol use.  Tobacco use.  Drug use.  Emotional well-being.   Home and relationship well-being.  Sexual activity.  Eating habits.  History of falls.  Memory and ability to understand (cognition).  Work and work Statistician. Screening  You may have the following tests or measurements:  Height, weight, and BMI.  Blood pressure.  Lipid and cholesterol levels. These may be checked every 5 years, or more frequently if you are over 51 years old.  Skin check.  Lung cancer screening. You may have this screening every year starting at age 37 if you have a 30-pack-year history of smoking and currently smoke or have quit within the past 15 years.  Fecal occult blood test (FOBT) of the stool. You may have this test every year starting at age 43.  Flexible sigmoidoscopy or colonoscopy. You may have a sigmoidoscopy every 5 years or a colonoscopy every 10 years starting at age 30.  Prostate cancer screening. Recommendations will vary depending on your family history and other risks.  Hepatitis C blood test.  Hepatitis B blood test.  Sexually transmitted disease (STD) testing.  Diabetes screening. This is done by checking your blood sugar (glucose) after you have not eaten for a while (fasting). You may have this done every 1-3 years.  Abdominal aortic aneurysm (AAA) screening. You may need this if you are a current or former smoker.  Osteoporosis. You may be screened starting at age 58 if you are at high risk. Talk with your health care provider about your test results, treatment options, and if necessary, the need for more tests. Vaccines  Your health care provider may recommend certain vaccines, such as:  Influenza vaccine. This is recommended every year.  Tetanus, diphtheria, and acellular  pertussis (Tdap, Td) vaccine. You may need a Td booster every 10 years.  Zoster vaccine. You may need this after age 58.  Pneumococcal 13-valent conjugate (PCV13) vaccine. One dose is recommended after age 54.  Pneumococcal polysaccharide (PPSV23)  vaccine. One dose is recommended after age 95. Talk to your health care provider about which screenings and vaccines you need and how often you need them. This information is not intended to replace advice given to you by your health care provider. Make sure you discuss any questions you have with your health care provider. Document Released: 03/27/2015 Document Revised: 11/18/2015 Document Reviewed: 12/30/2014 Elsevier Interactive Patient Education  2017 [place] Prevention in the Home Falls can cause injuries. They can happen to people of all ages. There are many things you can do to make your home safe and to help prevent falls. What can I do on the outside of my home?  Regularly fix the edges of walkways and driveways and fix any cracks.  Remove anything that might make you trip as you walk through a door, such as a raised step or threshold.  Trim any bushes or trees on the path to your home.  Use bright outdoor lighting.  Clear any walking paths of anything that might make someone trip, such as rocks or tools.  Regularly check to see if handrails are loose or broken. Make sure that both sides of any steps have handrails.  Any raised decks and porches should have guardrails on the edges.  Have any leaves, snow, or ice cleared regularly.  Use sand or salt on walking paths during winter.  Clean up any spills in your garage right away. This includes oil or grease spills. What can I do in the bathroom?  Use night lights.  Install grab bars by the toilet and in the tub and shower. Do not use towel bars as grab bars.  Use non-skid mats or decals in the tub or shower.  If you need to sit down in the shower, use a plastic, non-slip stool.  Keep the floor dry. Clean up any water that spills on the floor as soon as it happens.  Remove soap buildup in the tub or shower regularly.  Attach bath mats securely with double-sided non-slip rug tape.  Do not have throw rugs  and other things on the floor that can make you trip. What can I do in the bedroom?  Use night lights.  Make sure that you have a light by your bed that is easy to reach.  Do not use any sheets or blankets that are too big for your bed. They should not hang down onto the floor.  Have a firm chair that has side arms. You can use this for support while you get dressed.  Do not have throw rugs and other things on the floor that can make you trip. What can I do in the kitchen?  Clean up any spills right away.  Avoid walking on wet floors.  Keep items that you use a lot in easy-to-reach places.  If you need to reach something above you, use a strong step stool that has a grab bar.  Keep electrical cords out of the way.  Do not use floor polish or wax that makes floors slippery. If you must use wax, use non-skid floor wax.  Do not have throw rugs and other things on the floor that can make you trip. What can I do with my stairs?  Do not leave any items on the stairs.  Make sure that there are handrails on both sides of the stairs and use them. Fix handrails that are broken or loose. Make sure that handrails are as long as the stairways.  Check any carpeting to make sure that it is firmly attached to the stairs. Fix any carpet that is loose or worn.  Avoid having throw rugs at the top or bottom of the stairs. If you do have throw rugs, attach them to the floor with carpet tape.  Make sure that you have a light switch at the top of the stairs and the bottom of the stairs. If you do not have them, ask someone to add them for you. What else can I do to help prevent falls?  Wear shoes that:  Do not have high heels.  Have rubber bottoms.  Are comfortable and fit you well.  Are closed at the toe. Do not wear sandals.  If you use a stepladder:  Make sure that it is fully opened. Do not climb a closed stepladder.  Make sure that both sides of the stepladder are locked into  place.  Ask someone to hold it for you, if possible.  Clearly mark and make sure that you can see:  Any grab bars or handrails.  First and last steps.  Where the edge of each step is.  Use tools that help you move around (mobility aids) if they are needed. These include:  Canes.  Walkers.  Scooters.  Crutches.  Turn on the lights when you go into a dark area. Replace any light bulbs as soon as they burn out.  Set up your furniture so you have a clear path. Avoid moving your furniture around.  If any of your floors are uneven, fix them.  If there are any pets around you, be aware of where they are.  Review your medicines with your doctor. Some medicines can make you feel dizzy. This can increase your chance of falling. Ask your doctor what other things that you can do to help prevent falls. This information is not intended to replace advice given to you by your health care provider. Make sure you discuss any questions you have with your health care provider. Document Released: 12/25/2008 Document Revised: 08/06/2015 Document Reviewed: 04/04/2014 Elsevier Interactive Patient Education  2017 [employer].

## 2018-10-08 NOTE — Progress Notes (Signed)
Patient: Jeffery Davis, Adult    DOB: 18-Oct-1945, 73 y.o.   MRN: [id number] Visit Date: 10/08/2018  Today's Provider: [name], MD   Chief Complaint  Patient presents with   Annual Exam   Subjective:     Annual physical exam Jeffery Davis is a 73 y.o. adult who presents today for health maintenance and complete physical. He feels well. Marland Kitchen He reports exercising weekly by walking, running, riding his bike and lifting weights. He reports he is sleeping well.  -----------------------------------------------------------------   Review of Systems  Constitutional: Negative.  Negative for activity change, appetite change, chills, diaphoresis, fatigue, fever and unexpected weight change.  HENT: Negative.   Eyes: Negative.   Respiratory: Negative.  Negative for cough and shortness of breath.   Cardiovascular: Negative.  Negative for chest pain, palpitations and leg swelling.  Gastrointestinal: Negative.   Endocrine: Negative.   Genitourinary: Negative.   Musculoskeletal: Negative.  Negative for arthralgias and back pain.  Skin: Negative.   Allergic/Immunologic: Negative.   Neurological: Negative.  Negative for dizziness, light-headedness and headaches.  Hematological: Negative.   Psychiatric/Behavioral: Negative.  Negative for agitation, self-injury, sleep disturbance and suicidal ideas. The patient is not nervous/anxious.     Social History      He  reports that he has never smoked. He has never used smokeless tobacco. He reports current alcohol use of about 7.0 - 14.0 standard drinks of alcohol per week. He reports that he does not use drugs.       Social History   Socioeconomic History   Marital status: Married    Spouse name: Coralyn Mark   Number of children: 2   Years of education: bachelors   Highest education level: Bachelor's degree (e.g., BA, AB, BS)  Occupational History   Occupation: Retired     Comment: worked at [employer] 12  years.     Employer: [employer] resource strain: Not hard at all   Food insecurity    Worry: Never true    Inability: Never true   Transportation needs    Medical: No    Non-medical: No  Tobacco Use   Smoking status: Never Smoker   Smokeless tobacco: Never Used  Substance and Sexual Activity   Alcohol use: Yes    Alcohol/week: 7.0 - 14.0 standard drinks    Types: 7 - 14 Glasses of wine per week    Comment: 1-2 glasses of wine daily   Drug use: No   Sexual activity: Not on file  Lifestyle   Physical activity    Days per week: 7 days    Minutes per session: 60 min   Stress: Only a little  Relationships   Social connections    Talks on phone: Patient refused    Gets together: Patient refused    Attends religious service: Patient refused    Active member of club or organization: Patient refused    Attends meetings of clubs or organizations: Patient refused    Relationship status: Patient refused  Other Topics Concern   Not on file  Social History Narrative   Not on file    Past Medical History:  Diagnosis Date   Actinic keratosis    Allergy    Arthritis    degenerative   Atrial fibrillation ([place])    s/p cardioversion 07/18/17   Benign fibroma of prostate    Carpal tunnel syndrome, right  Erectile dysfunction    GERD (gastroesophageal reflux disease)    Hyperlipidemia    Hypertension    Lumbar herniated disc    Lumbar stenosis with neurogenic claudication    LV dysfunction    Neural foraminal stenosis of lumbosacral spine    Primary localized osteoarthrosis of left shoulder 03/01/2016   Wears hearing aid in both ears      Patient Active Problem List   Diagnosis Date Noted   HTN, goal below 140/80 04/06/2018   Persistent atrial fibrillation 10/17/2017   Neural foraminal stenosis of lumbosacral spine 08/03/2017   Myocardiopathy ([place]) 07/03/2017   Ataxia 06/13/2017   Atrial fibrillation  ([place]) 06/13/2017   Lumbar stenosis with neurogenic claudication 01/09/2017   Diastasis recti 11/17/2016   Patellofemoral stress syndrome 07/21/2016   Acquired trigger finger 07/21/2016   Acute deep vein thrombosis (DVT) of proximal vein of left lower extremity ([place]) 03/24/2016   Primary localized osteoarthrosis of left shoulder 03/01/2016   S/P shoulder replacement 03/01/2016   Medicare annual wellness visit, subsequent 09/29/2015   Chest tightness 09/29/2015   Fall with injury 09/29/2015   Actinic keratoses 08/19/2014   Allergic rhinitis 08/19/2014   Benign fibroma of prostate 08/19/2014   Acid indigestion 08/19/2014   Failure of erection 08/19/2014   Acid reflux 08/19/2014   Hypercholesteremia 08/19/2014   Arthritis, degenerative 08/19/2014    Past Surgical History:  Procedure Laterality Date   ABLATION OF DYSRHYTHMIC FOCUS  10/17/2017   ATRIAL FIBRILLATION ABLATION N/A 10/17/2017   Procedure: ATRIAL FIBRILLATION ABLATION;  Surgeon: Thompson Grayer, MD;  Location: [place] [facility];  Service: Cardiovascular;  Laterality: N/A;   CARDIAC CATHETERIZATION  2005   [place] N/A 07/18/2017   Procedure: CARDIOVERSION;  Surgeon: Teodoro Spray, MD;  Location: [facility] OR[facility];  Service: Cardiovascular;  Laterality: N/A;   CATARACT EXTRACTION  06/2002   COLONOSCOPY     COLONOSCOPY WITH PROPOFOL N/A 05/03/2017   Procedure: COLONOSCOPY WITH PROPOFOL;  Surgeon: Manya Silvas, MD;  Location: [facility] ENDOSCOPY;  Service: Endoscopy;  Laterality: N/A;   ESOPHAGOGASTRODUODENOSCOPY (EGD) WITH PROPOFOL N/A 01/08/2016   Procedure: ESOPHAGOGASTRODUODENOSCOPY (EGD) WITH PROPOFOL;  Surgeon: Manya Silvas, MD;  Location: [facility] ENDOSCOPY;  Service: Endoscopy;  Laterality: N/A;   HAND SURGERY     Implantable loop recorder placement  04/30/2018   MDT Reveal LINQ implanted [facility] [id number]) by Dr Rayann Heman for afib management post ablation.   KNEE ARTHROSCOPY Right      LUMBAR LAMINECTOMY/DECOMPRESSION MICRODISCECTOMY N/A 01/09/2017   Procedure: LAMINECTOMY AND FORAMINOTOMY LUMBAR THREE-FOUR LUMBAR FOUR-FIVE  LUMBAR FIVE SACRAL ONE;  Surgeon: Newman Pies, MD;  Location: [place];  Service: Neurosurgery;  Laterality: N/A;   SHOULDER SURGERY Left 2010   TOTAL SHOULDER ARTHROPLASTY Left 03/01/2016   Procedure: REVERSE TOTAL SHOULDER ARTHROPLASTY;  Surgeon: Marchia Bond, MD;  Location: [place];  Service: Orthopedics;  Laterality: Left;    Family History        Family Status  Relation Name Status   Mother  Deceased   Father  Deceased   Daughter  Alive   Son  Alive   MGM  Deceased   MGF  Deceased   PGM  Deceased   PGF  Deceased        His family history includes CAD in his father; Cancer in his mother; Healthy in his daughter and son; Hypertension in his father.      No Known Allergies   Current Outpatient Medications:    atorvastatin (  LIPITOR) 10 MG tablet, TAKE ONE TABLET BY MOUTH EVERY OTHER DAY, Disp: 45 tablet, Rfl: 11   Cholecalciferol (VITAMIN D3) 5000 units TABS, Take 5,000 Units by mouth daily., Disp: , Rfl:    Coenzyme Q10 (CO Q 10) 100 MG CAPS, Take 100 mg by mouth 2 (two) times daily. , Disp: , Rfl:    diphenhydramine-acetaminophen (TYLENOL PM) 25-500 MG TABS tablet, Take 2 tablets by mouth at bedtime as needed (sleep)., Disp: , Rfl:    ketoconazole (NIZORAL) 2 % cream, Apply 1 application topically every other day., Disp: , Rfl:    Misc Natural Products (GLUCOSAMINE CHONDROITIN TRIPLE) TABS, Take 1 tablet by mouth 2 (two) times daily., Disp: , Rfl:    omeprazole (PRILOSEC) 20 MG capsule, TAKE 1 CAPSULE EVERY DAY (Patient taking differently: Take 20 mg by mouth every other day. TAKE 1 CAPSULE EVERY DAY), Disp: 90 capsule, Rfl: 3   rivaroxaban (XARELTO) 20 MG TABS tablet, Take 20 mg by mouth daily with supper., Disp: , Rfl:    sildenafil (REVATIO) 20 MG tablet, TAKE ONE TABLET BY MOUTH 3 TIMES DAILY AS NEEDED, Disp: 90  tablet, Rfl: 3   telmisartan (MICARDIS) 20 MG tablet, Take 20 mg by mouth daily. , Disp: , Rfl:    Patient Care Team: Jerrol Banana., MD as PCP - General (Family Medicine) Idelle Leech, OD as Consulting Physician (Optometry) Ralene Bathe, MD as Consulting Physician (Dermatology) Teodoro Spray, MD as Consulting Physician (Cardiology) Thompson Grayer, MD as Consulting Physician (Cardiology) Newman Pies, MD as Consulting Physician (Neurosurgery) Marchia Bond, MD as Consulting Physician (Orthopedic Surgery)    Objective:    Vitals: BP 129/62 (BP Location: Right Arm, Patient Position: Sitting, Cuff Size: Normal)    Pulse (!) 59    Temp 97.9 F (36.6 C) (Oral)    Resp 18    Wt 164 lb 6.4 oz (74.6 kg)    SpO2 98%    BMI 25.00 kg/m    Vitals:   10/08/18 1011  BP: 129/62  Pulse: (!) 59  Resp: 18  Temp: 97.9 F (36.6 C)  TempSrc: Oral  SpO2: 98%  Weight: 164 lb 6.4 oz (74.6 kg)     Physical Exam Vitals signs reviewed.  Constitutional:      Appearance: He is well-developed.  HENT:     Head: Normocephalic and atraumatic.     Right Ear: External ear normal.     Left Ear: External ear normal.     Nose: Nose normal.  Eyes:     General: No scleral icterus.    Conjunctiva/sclera: Conjunctivae normal.     Comments: Right pupil 1 mm smaller than left.  Neck:     Thyroid: No thyromegaly.  Cardiovascular:     Rate and Rhythm: Normal rate and regular rhythm.     Pulses: Normal pulses.     Heart sounds: Normal heart sounds.  Pulmonary:     Effort: Pulmonary effort is normal.     Breath sounds: Normal breath sounds.  Abdominal:     Palpations: Abdomen is soft.  Musculoskeletal:     Right lower leg: No edema.     Left lower leg: No edema.  Lymphadenopathy:     Cervical: No cervical adenopathy.  Skin:    General: Skin is warm and dry.  Neurological:     General: No focal deficit present.     Mental Status: He is alert and oriented to person, place, and  time. Mental status  is at baseline.  Psychiatric:        Mood and Affect: Mood normal.        Behavior: Behavior normal.        Thought Content: Thought content normal.        Judgment: Judgment normal.      Depression Screen PHQ 2/9 Scores 10/08/2018 10/08/2018 10/03/2017 10/03/2017  PHQ - 2 Score 0 0 0 0  PHQ- 9 Score 0 - 1 -       Assessment & Plan:     Routine Health Maintenance and Physical Exam  Exercise Activities and Dietary recommendations Goals     DIET - REDUCE PORTION SIZE     Recommend decreasing portion sizes by half and eating 3 small meals a day with two healthy snacks in between.      Reduce salt intake to 2 grams per day or less     Reduce salt intake to 2 grams per day or less       Immunization History  Administered Date(s) Administered   Influenza, High Dose Seasonal PF 01/08/2015, 12/08/2015, 12/30/2016   Influenza,inj,Quad PF,6+ Mos 01/07/2014   Influenza-Unspecified 12/31/2017   Pneumococcal Conjugate-13 11/10/2014   Pneumococcal Polysaccharide-23 10/06/2011   Td 12/22/2003   Tdap 10/06/2011   Zoster 03/14/2010    Health Maintenance  Topic Date Due   MAMMOGRAM  09/12/1995   DEXA SCAN  09/12/2010   INFLUENZA VACCINE  10/13/2018   COLONOSCOPY  05/03/2020   TETANUS/TDAP  10/05/2021   Hepatitis C Screening  Completed   PNA vac Low Risk Adult  Completed     Discussed health benefits of physical activity, and encouraged him to engage in regular exercise appropriate for his age and condition.  1. Annual physical exam  - POCT urinalysis dipstick - TSH - Comp. Metabolic Panel (12) - Lipid Profile - CBC w/Diff/Platelet  2. Barrett's esophagus without dysplasia Short segment Barretts--refer back to GI.Lifelong PPI. - Ambulatory referral to Gastroenterology    --------------------------------------------------------------------    [name], MD  [facility]

## 2018-10-09 ENCOUNTER — Telehealth: Payer: Self-pay

## 2018-10-09 LAB — LIPID PANEL
Chol/HDL Ratio: 3.3 ratio (ref 0.0–5.0)
Cholesterol, Total: 163 mg/dL (ref 100–199)
HDL: 49 mg/dL (ref >39)
LDL Calculated: 80 mg/dL (ref 0–99)
Triglycerides: 172 mg/dL — ABNORMAL HIGH (ref 0–149)
VLDL Cholesterol Cal: 34 mg/dL (ref 5–40)

## 2018-10-09 LAB — COMP. METABOLIC PANEL (12)
AST: 35 IU/L (ref 0–40)
Albumin/Globulin Ratio: 2.2 (ref 1.2–2.2)
Albumin: 4.4 g/dL (ref 3.7–4.7)
Alkaline Phosphatase: 68 IU/L (ref 39–117)
BUN/Creatinine Ratio: 28 — ABNORMAL HIGH (ref 10–24)
BUN: 22 mg/dL (ref 8–27)
Bilirubin Total: 0.8 mg/dL (ref 0.0–1.2)
Calcium: 9 mg/dL (ref 8.6–10.2)
Chloride: 104 mmol/L (ref 96–106)
Creatinine, Ser: 0.78 mg/dL (ref 0.76–1.27)
GFR calc Af Amer: 104 mL/min/{1.73_m2} (ref >59)
GFR calc non Af Amer: 90 mL/min/{1.73_m2} (ref >59)
Globulin, Total: 2 g/dL (ref 1.5–4.5)
Glucose: 97 mg/dL (ref 65–99)
Potassium: 4.1 mmol/L (ref 3.5–5.2)
Sodium: 141 mmol/L (ref 134–144)
Total Protein: 6.4 g/dL (ref 6.0–8.5)

## 2018-10-09 LAB — CBC WITH DIFFERENTIAL/PLATELET
Basophils Absolute: 0 10*3/uL (ref 0.0–0.2)
Basos: 1 %
EOS (ABSOLUTE): 0.1 10*3/uL (ref 0.0–0.4)
Eos: 2 %
Hematocrit: 41.1 % (ref 37.5–51.0)
Hemoglobin: 14 g/dL (ref 13.0–17.7)
Immature Grans (Abs): 0 10*3/uL (ref 0.0–0.1)
Immature Granulocytes: 0 %
Lymphocytes Absolute: 1.7 10*3/uL (ref 0.7–3.1)
Lymphs: 37 %
MCH: 33.3 pg — ABNORMAL HIGH (ref 26.6–33.0)
MCHC: 34.1 g/dL (ref 31.5–35.7)
MCV: 98 fL — ABNORMAL HIGH (ref 79–97)
Monocytes Absolute: 0.5 10*3/uL (ref 0.1–0.9)
Monocytes: 10 %
Neutrophils Absolute: 2.3 10*3/uL (ref 1.4–7.0)
Neutrophils: 50 %
Platelets: 240 10*3/uL (ref 150–450)
RBC: 4.21 x10E6/uL (ref 4.14–5.80)
RDW: 12.9 % (ref 11.6–15.4)
WBC: 4.6 10*3/uL (ref 3.4–10.8)

## 2018-10-09 LAB — TSH: TSH: 1.5 u[IU]/mL (ref 0.450–4.500)

## 2018-10-09 NOTE — Telephone Encounter (Signed)
Patient notified of lab results

## 2018-10-09 NOTE — Telephone Encounter (Signed)
-----   Message from Jerrol Banana., MD sent at 10/09/2018  1:58 PM EDT ----- Labs all good.

## 2018-10-12 ENCOUNTER — Ambulatory Visit (INDEPENDENT_AMBULATORY_CARE_PROVIDER_SITE_OTHER): Payer: PPO | Admitting: *Deleted

## 2018-10-12 DIAGNOSIS — I480 Paroxysmal atrial fibrillation: Secondary | ICD-10-CM

## 2018-10-12 LAB — CUP PACEART REMOTE DEVICE CHECK
Date Time Interrogation Session: 20200731140801
Implantable Pulse Generator Implant Date: 20200217

## 2018-10-15 DIAGNOSIS — I10 Essential (primary) hypertension: Secondary | ICD-10-CM | POA: Diagnosis not present

## 2018-10-15 DIAGNOSIS — K2270 Barrett's esophagus without dysplasia: Secondary | ICD-10-CM | POA: Diagnosis not present

## 2018-10-15 DIAGNOSIS — Z86010 Personal history of colonic polyps: Secondary | ICD-10-CM | POA: Diagnosis not present

## 2018-10-15 DIAGNOSIS — Z7901 Long term (current) use of anticoagulants: Secondary | ICD-10-CM | POA: Diagnosis not present

## 2018-10-15 DIAGNOSIS — I480 Paroxysmal atrial fibrillation: Secondary | ICD-10-CM | POA: Diagnosis not present

## 2018-10-15 DIAGNOSIS — K210 Gastro-esophageal reflux disease with esophagitis: Secondary | ICD-10-CM | POA: Diagnosis not present

## 2018-10-16 DIAGNOSIS — R030 Elevated blood-pressure reading, without diagnosis of hypertension: Secondary | ICD-10-CM | POA: Diagnosis not present

## 2018-10-16 DIAGNOSIS — M5127 Other intervertebral disc displacement, lumbosacral region: Secondary | ICD-10-CM | POA: Diagnosis not present

## 2018-10-16 DIAGNOSIS — Z6825 Body mass index (BMI) 25.0-25.9, adult: Secondary | ICD-10-CM | POA: Diagnosis not present

## 2018-10-16 DIAGNOSIS — M9983 Other biomechanical lesions of lumbar region: Secondary | ICD-10-CM | POA: Diagnosis not present

## 2018-10-18 NOTE — Progress Notes (Signed)
Carelink Summary Report / Loop Recorder 

## 2018-11-14 ENCOUNTER — Ambulatory Visit: Payer: PPO | Admitting: *Deleted

## 2018-11-15 LAB — CUP PACEART REMOTE DEVICE CHECK
Date Time Interrogation Session: 20200902144000
Implantable Pulse Generator Implant Date: 20200217

## 2018-11-30 ENCOUNTER — Telehealth: Payer: Self-pay

## 2018-11-30 NOTE — Telephone Encounter (Signed)
Spoke with regarding appt on 12/03/18. Pt stated he will check his vitals prior to his appt. Pt questions was address.

## 2018-12-03 ENCOUNTER — Telehealth (INDEPENDENT_AMBULATORY_CARE_PROVIDER_SITE_OTHER): Payer: PPO | Admitting: Internal Medicine

## 2018-12-03 ENCOUNTER — Encounter: Payer: Self-pay | Admitting: Internal Medicine

## 2018-12-03 VITALS — BP 152/89 | HR 59 | Ht 68.0 in | Wt 158.2 lb

## 2018-12-03 DIAGNOSIS — I10 Essential (primary) hypertension: Secondary | ICD-10-CM | POA: Diagnosis not present

## 2018-12-03 DIAGNOSIS — I519 Heart disease, unspecified: Secondary | ICD-10-CM

## 2018-12-03 DIAGNOSIS — I480 Paroxysmal atrial fibrillation: Secondary | ICD-10-CM

## 2018-12-03 NOTE — Progress Notes (Signed)
Electrophysiology TeleHealth Note   Due to national recommendations of social distancing due to COVID 19, an audio/video telehealth visit is felt to be most appropriate for this patient at this time.  See MyChart message from today for the patient's consent to telehealth for [facility].   Date:  12/03/2018   ID:  Jeffery Davis, DOB 1945/05/09, MRN [id number]  Location: patient's home  Provider location:  [facility]  Evaluation Performed: Follow-up visit  PCP:  Jeffery Banana., MD   Electrophysiologist:  Dr Jeffery Davis  Chief Complaint:  palpitations  History of Present Illness:    Jeffery Davis is a 73 y.o. adult who presents via telehealth conferencing today.  Since last being seen in our clinic, the patient reports doing very well. Exercising regularly without limitation.  He has lost 20 lbs!  No AF symptoms.  Today, he denies symptoms of palpitations, chest pain, shortness of breath,  lower extremity edema, dizziness, presyncope, or syncope.  The patient is otherwise without complaint today.  The patient denies symptoms of fevers, chills, cough, or new SOB worrisome for COVID 19.  Past Medical History:  Diagnosis Date  . Actinic keratosis   . Allergy   . Arthritis    degenerative  . Atrial fibrillation ([place])    s/p cardioversion 07/18/17  . Benign fibroma of prostate   . Carpal tunnel syndrome, right   . Erectile dysfunction   . GERD (gastroesophageal reflux disease)   . Hyperlipidemia   . Hypertension   . Lumbar herniated disc   . Lumbar stenosis with neurogenic claudication   . LV dysfunction   . Neural foraminal stenosis of lumbosacral spine   . Primary localized osteoarthrosis of left shoulder 03/01/2016  . Wears hearing aid in both ears     Past Surgical History:  Procedure Laterality Date  . ABLATION OF DYSRHYTHMIC FOCUS  10/17/2017  . ATRIAL FIBRILLATION ABLATION N/A 10/17/2017   Procedure: ATRIAL FIBRILLATION ABLATION;  Surgeon: Thompson Grayer,  MD;  Location: [place] [facility];  Service: Cardiovascular;  Laterality: N/A;  . CARDIAC CATHETERIZATION  2005   [place] N/A 07/18/2017   Procedure: CARDIOVERSION;  Surgeon: Teodoro Spray, MD;  Location: [facility] ORS;  Service: Cardiovascular;  Laterality: N/A;  . CATARACT EXTRACTION  06/2002  . COLONOSCOPY    . COLONOSCOPY WITH PROPOFOL N/A 05/03/2017   Procedure: COLONOSCOPY WITH PROPOFOL;  Surgeon: Manya Silvas, MD;  Location: [facility] ENDOSCOPY;  Service: Endoscopy;  Laterality: N/A;  . ESOPHAGOGASTRODUODENOSCOPY (EGD) WITH PROPOFOL N/A 01/08/2016   Procedure: ESOPHAGOGASTRODUODENOSCOPY (EGD) WITH PROPOFOL;  Surgeon: Manya Silvas, MD;  Location: [facility] ENDOSCOPY;  Service: Endoscopy;  Laterality: N/A;  . HAND SURGERY    . Implantable loop recorder placement  04/30/2018   MDT Reveal LINQ implanted [facility] [id number]) by Dr Jeffery Davis for afib management post ablation.  Marland Kitchen KNEE ARTHROSCOPY Right   . LUMBAR LAMINECTOMY/DECOMPRESSION MICRODISCECTOMY N/A 01/09/2017   Procedure: LAMINECTOMY AND FORAMINOTOMY LUMBAR THREE-FOUR LUMBAR FOUR-FIVE  LUMBAR FIVE SACRAL ONE;  Surgeon: Newman Pies, MD;  Location: [place];  Service: Neurosurgery;  Laterality: N/A;  . SHOULDER SURGERY Left 2010  . TOTAL SHOULDER ARTHROPLASTY Left 03/01/2016   Procedure: REVERSE TOTAL SHOULDER ARTHROPLASTY;  Surgeon: Marchia Bond, MD;  Location: [place];  Service: Orthopedics;  Laterality: Left;    Current Outpatient Medications  Medication Sig Dispense Refill  . atorvastatin (LIPITOR) 10 MG tablet TAKE ONE TABLET BY MOUTH EVERY OTHER DAY 45 tablet 11  .  Cholecalciferol (VITAMIN D3) 5000 units TABS Take 5,000 Units by mouth daily.    . Coenzyme Q10 (CO Q 10) 100 MG CAPS Take 100 mg by mouth 2 (two) times daily.     . diphenhydramine-acetaminophen (TYLENOL PM) 25-500 MG TABS tablet Take 2 tablets by mouth at bedtime as needed (sleep).    Marland Kitchen ketoconazole (NIZORAL) 2 % cream Apply 1 application topically  every other day.    . Misc Natural Products (GLUCOSAMINE CHONDROITIN TRIPLE) TABS Take 1 tablet by mouth 2 (two) times daily.    Marland Kitchen omeprazole (PRILOSEC) 20 MG capsule TAKE 1 CAPSULE EVERY DAY (Patient taking differently: Take 20 mg by mouth every other day. TAKE 1 CAPSULE EVERY DAY) 90 capsule 3  . rivaroxaban (XARELTO) 20 MG TABS tablet Take 20 mg by mouth daily with supper.    . sildenafil (REVATIO) 20 MG tablet TAKE ONE TABLET BY MOUTH 3 TIMES DAILY AS NEEDED 90 tablet 3  . telmisartan (MICARDIS) 20 MG tablet Take 20 mg by mouth daily.      No current facility-administered medications for this visit.     Allergies:   Patient has no known allergies.   Social History:  The patient  reports that he has never smoked. He has never used smokeless tobacco. He reports current alcohol use of about 7.0 - 14.0 standard drinks of alcohol per week. He reports that he does not use drugs.   Family History:  The patient's family history includes CAD in his father; Cancer in his mother; Healthy in his daughter and son; Hypertension in his father.   ROS:  Please see the history of present illness.   All other systems are personally reviewed and negative.    Exam:    Vital Signs:  BP (!) 152/89   Pulse (!) 59   Ht 5\' 8"  (1.727 m)   Wt 158 lb 3.2 oz (71.8 kg)   BMI 24.05 kg/m   Well sounding and appearing, alert and conversant, regular work of breathing,  good skin color Eyes- anicteric, neuro- grossly intact, skin- no apparent rash or lesions or cyanosis, mouth- oral mucosa is pink  Labs/Other Tests and Data Reviewed:    Recent Labs: 03/15/2018: ALT 25 10/08/2018: BUN 22; Creatinine, Ser 0.78; Hemoglobin 14.0; Platelets 240; Potassium 4.1; Sodium 141; TSH 1.500   Wt Readings from Last 3 Encounters:  12/03/18 158 lb 3.2 oz (71.8 kg)  10/08/18 164 lb 6.4 oz (74.6 kg)  09/13/18 167 lb 6.4 oz (75.9 kg)     ILR reveals no afib episodes  ASSESSMENT & PLAN:    1.  persistent afib/ atrial flutter  Doing great post ablation off AAD therapy chads2vasc csore is 5.  He is on xarelto ILR has revealed no afib episodes I have advised that he continue anticoagulation lifelong given his prior stroke.  He is very clear in his own personal decision that he is going to stop anticoagulation at this time.  He understands the risks of doing so.  If he has further AF detected on his ILR then he may be more willing to resume anticoagulation at that time.  2, nonischemic CM Followed by Dr Ubaldo Glassing  3. HTN Elevated today,  Typically controlled  Follow-up:  AF clinic in 6 months I will see in a year careink   Patient Risk:  after full review of this patients clinical status, I feel that they are at moderate risk at this time.  Today, I have spent 15 minutes with the  patient with telehealth technology discussing arrhythmia management .    Army Fossa, MD  12/03/2018 10:08 AM     [facility] [address] [place] [address] [phone] (office) [phone] (fax)

## 2018-12-05 ENCOUNTER — Telehealth: Payer: Self-pay | Admitting: Family Medicine

## 2018-12-05 ENCOUNTER — Ambulatory Visit (INDEPENDENT_AMBULATORY_CARE_PROVIDER_SITE_OTHER): Payer: PPO

## 2018-12-05 ENCOUNTER — Other Ambulatory Visit: Payer: Self-pay

## 2018-12-05 DIAGNOSIS — Z23 Encounter for immunization: Secondary | ICD-10-CM | POA: Diagnosis not present

## 2018-12-05 NOTE — Telephone Encounter (Signed)
Pt calling back before his flu shot.  Thanks, [employer]

## 2018-12-17 ENCOUNTER — Ambulatory Visit (INDEPENDENT_AMBULATORY_CARE_PROVIDER_SITE_OTHER): Payer: PPO | Admitting: *Deleted

## 2018-12-17 DIAGNOSIS — I480 Paroxysmal atrial fibrillation: Secondary | ICD-10-CM

## 2018-12-18 LAB — CUP PACEART REMOTE DEVICE CHECK
Date Time Interrogation Session: 20201005143949
Implantable Pulse Generator Implant Date: 20200217

## 2018-12-20 ENCOUNTER — Other Ambulatory Visit: Payer: Self-pay

## 2018-12-20 ENCOUNTER — Other Ambulatory Visit
Admission: RE | Admit: 2018-12-20 | Discharge: 2018-12-20 | Disposition: A | Discharge status: Home / Self Care | Payer: PPO | Source: Ambulatory Visit | Attending: Internal Medicine | Admitting: Internal Medicine

## 2018-12-20 DIAGNOSIS — Z20828 Contact with and (suspected) exposure to other viral communicable diseases: Secondary | ICD-10-CM | POA: Diagnosis not present

## 2018-12-20 DIAGNOSIS — Z01812 Encounter for preprocedural laboratory examination: Secondary | ICD-10-CM | POA: Insufficient documentation

## 2018-12-20 LAB — SARS CORONAVIRUS 2 (TAT 6-24 HRS): SARS Coronavirus 2: NEGATIVE

## 2018-12-24 ENCOUNTER — Ambulatory Visit: Payer: PPO | Admitting: Anesthesiology

## 2018-12-24 ENCOUNTER — Encounter: Payer: Self-pay | Admitting: Anesthesiology

## 2018-12-24 ENCOUNTER — Ambulatory Visit
Admission: RE | Admit: 2018-12-24 | Discharge: 2018-12-24 | Disposition: A | Discharge status: Home / Self Care | Payer: PPO | Attending: Internal Medicine | Admitting: Internal Medicine

## 2018-12-24 ENCOUNTER — Encounter
Admission: RE | Disposition: A | Discharge status: Home / Self Care | Payer: Self-pay | Source: Home / Self Care | Attending: Internal Medicine

## 2018-12-24 DIAGNOSIS — K219 Gastro-esophageal reflux disease without esophagitis: Secondary | ICD-10-CM | POA: Diagnosis not present

## 2018-12-24 DIAGNOSIS — K2270 Barrett's esophagus without dysplasia: Secondary | ICD-10-CM | POA: Diagnosis not present

## 2018-12-24 DIAGNOSIS — I429 Cardiomyopathy, unspecified: Secondary | ICD-10-CM | POA: Diagnosis not present

## 2018-12-24 DIAGNOSIS — K449 Diaphragmatic hernia without obstruction or gangrene: Secondary | ICD-10-CM | POA: Diagnosis not present

## 2018-12-24 DIAGNOSIS — Z8719 Personal history of other diseases of the digestive system: Secondary | ICD-10-CM | POA: Diagnosis not present

## 2018-12-24 DIAGNOSIS — Z79899 Other long term (current) drug therapy: Secondary | ICD-10-CM | POA: Insufficient documentation

## 2018-12-24 DIAGNOSIS — Z7901 Long term (current) use of anticoagulants: Secondary | ICD-10-CM | POA: Diagnosis not present

## 2018-12-24 DIAGNOSIS — E7800 Pure hypercholesterolemia, unspecified: Secondary | ICD-10-CM | POA: Diagnosis not present

## 2018-12-24 DIAGNOSIS — M19012 Primary osteoarthritis, left shoulder: Secondary | ICD-10-CM | POA: Insufficient documentation

## 2018-12-24 DIAGNOSIS — I4819 Other persistent atrial fibrillation: Secondary | ICD-10-CM | POA: Insufficient documentation

## 2018-12-24 DIAGNOSIS — E785 Hyperlipidemia, unspecified: Secondary | ICD-10-CM | POA: Diagnosis not present

## 2018-12-24 DIAGNOSIS — K2970 Gastritis, unspecified, without bleeding: Secondary | ICD-10-CM | POA: Diagnosis not present

## 2018-12-24 DIAGNOSIS — N529 Male erectile dysfunction, unspecified: Secondary | ICD-10-CM | POA: Insufficient documentation

## 2018-12-24 DIAGNOSIS — I10 Essential (primary) hypertension: Secondary | ICD-10-CM | POA: Insufficient documentation

## 2018-12-24 DIAGNOSIS — Z1289 Encounter for screening for malignant neoplasm of other sites: Secondary | ICD-10-CM | POA: Insufficient documentation

## 2018-12-24 DIAGNOSIS — Z7689 Persons encountering health services in other specified circumstances: Secondary | ICD-10-CM | POA: Diagnosis not present

## 2018-12-24 DIAGNOSIS — K2100 Gastro-esophageal reflux disease with esophagitis, without bleeding: Secondary | ICD-10-CM | POA: Diagnosis not present

## 2018-12-24 HISTORY — PX: ESOPHAGOGASTRODUODENOSCOPY (EGD) WITH PROPOFOL: SHX5813

## 2018-12-24 SURGERY — ESOPHAGOGASTRODUODENOSCOPY (EGD) WITH PROPOFOL
Anesthesia: General

## 2018-12-24 MED ORDER — LIDOCAINE HCL (PF) 2 % IJ SOLN
INTRAMUSCULAR | Status: DC | PRN
  Administered 2018-12-24 (×2): 80 mg via INTRADERMAL

## 2018-12-24 MED ORDER — PROPOFOL 10 MG/ML IV BOLUS
INTRAVENOUS | Status: DC | PRN
  Administered 2018-12-24: 20 mg via INTRAVENOUS
  Administered 2018-12-24: 50 mg via INTRAVENOUS
  Administered 2018-12-24: 10 mg via INTRAVENOUS

## 2018-12-24 MED ORDER — PROPOFOL 500 MG/50ML IV EMUL
INTRAVENOUS | Status: DC | PRN
  Administered 2018-12-24: 150 ug/kg/min via INTRAVENOUS

## 2018-12-24 MED ORDER — PROPOFOL 500 MG/50ML IV EMUL
INTRAVENOUS | Status: AC
  Filled 2018-12-24: qty 50

## 2018-12-24 MED ORDER — SODIUM CHLORIDE 0.9 % IV SOLN
INTRAVENOUS | Status: DC
  Administered 2018-12-24: 11:00:00 via INTRAVENOUS

## 2018-12-24 MED ORDER — LIDOCAINE HCL (PF) 2 % IJ SOLN
INTRAMUSCULAR | Status: AC
  Filled 2018-12-24: qty 10

## 2018-12-24 NOTE — H&P (Signed)
Outpatient short stay form Pre-procedure 12/24/2018 10:38 AM Jeffery Davis K. Alice Reichert, M.D.  Primary Physician: Miguel Aschoff, M.D.  Reason for visit:  Short segment Barrett's esophagus.  History of present illness:  Patient has hx of Barrett's esophagus. Denies dysphagia, weight loss or severe GERD symptoms. No abdominal pain.    Current Facility-Administered Medications:  .  0.9 %  sodium chloride infusion, , Intravenous, Continuous, Jamayia Croker, Benay Pike, MD  Medications Prior to Admission  Medication Sig Dispense Refill Last Dose  . Coenzyme Q10 (CO Q 10) 100 MG CAPS Take 100 mg by mouth 2 (two) times daily.    12/23/2018 at Unknown time  . diphenhydramine-acetaminophen (TYLENOL PM) 25-500 MG TABS tablet Take 2 tablets by mouth at bedtime as needed (sleep).   12/23/2018 at Unknown time  . ketoconazole (NIZORAL) 2 % cream Apply 1 application topically every other day.   Past Week at Unknown time  . Misc Natural Products (GLUCOSAMINE CHONDROITIN TRIPLE) TABS Take 1 tablet by mouth 2 (two) times daily.   12/23/2018 at Unknown time  . telmisartan (MICARDIS) 20 MG tablet Take 20 mg by mouth daily.    12/23/2018 at Unknown time  . atorvastatin (LIPITOR) 10 MG tablet TAKE ONE TABLET BY MOUTH EVERY OTHER DAY 45 tablet 11 12/22/2018  . Cholecalciferol (VITAMIN D3) 5000 units TABS Take 5,000 Units by mouth daily.   12/22/2018  . omeprazole (PRILOSEC) 20 MG capsule TAKE 1 CAPSULE EVERY DAY (Patient taking differently: Take 20 mg by mouth every other day. TAKE 1 CAPSULE EVERY DAY) 90 capsule 3 12/22/2018  . rivaroxaban (XARELTO) 20 MG TABS tablet Take 20 mg by mouth daily with supper.     . sildenafil (REVATIO) 20 MG tablet TAKE ONE TABLET BY MOUTH 3 TIMES DAILY AS NEEDED 90 tablet 3      No Known Allergies   Past Medical History:  Diagnosis Date  . Actinic keratosis   . Allergy   . Arthritis    degenerative  . Atrial fibrillation ([place])    s/p cardioversion 07/18/17  . Benign fibroma of  prostate   . Carpal tunnel syndrome, right   . Erectile dysfunction   . GERD (gastroesophageal reflux disease)   . Hyperlipidemia   . Hypertension   . Lumbar herniated disc   . Lumbar stenosis with neurogenic claudication   . LV dysfunction   . Neural foraminal stenosis of lumbosacral spine   . Primary localized osteoarthrosis of left shoulder 03/01/2016  . Wears hearing aid in both ears     Review of systems:  Otherwise negative.    Physical Exam  Gen: Alert, oriented. Appears stated age.  HEENT: [place]/AT. PERRLA. Lungs: CTA, no wheezes. CV: RR nl S1, S2. Abd: soft, benign, no masses. BS+ Ext: No edema. Pulses 2+    Planned procedures: Proceed with EGD with surveillance biopsies. The patient understands the nature of the planned procedure, indications, risks, alternatives and potential complications including but not limited to bleeding, infection, perforation, damage to internal organs and possible oversedation/side effects from anesthesia. The patient agrees and gives consent to proceed.  Please refer to procedure notes for findings, recommendations and patient disposition/instructions.     Jeris Easterly K. Alice Reichert, M.D. Gastroenterology 12/24/2018  10:38 AM

## 2018-12-24 NOTE — Anesthesia Preprocedure Evaluation (Signed)
Anesthesia Evaluation  Patient identified by MRN, date of birth, ID band Patient awake    Reviewed: Allergy & Precautions, NPO status , Patient's Chart, lab work & pertinent test results, reviewed documented beta blocker date and time   Airway Mallampati: II  TM Distance: >3 FB     Dental  (+) Chipped   Pulmonary           Cardiovascular hypertension, Pt. on medications + dysrhythmias Atrial Fibrillation      Neuro/Psych  Neuromuscular disease    GI/Hepatic GERD  ,  Endo/Other    Renal/GU      Musculoskeletal  (+) Arthritis ,   Abdominal   Peds  Hematology   Anesthesia Other Findings   Reproductive/Obstetrics                             Anesthesia Physical Anesthesia Plan  ASA: III  Anesthesia Plan: General   Post-op Pain Management:    Induction: Intravenous  PONV Risk Score and Plan:   Airway Management Planned:   Additional Equipment:   Intra-op Plan:   Post-operative Plan:   Informed Consent: I have reviewed the patients History and Physical, chart, labs and discussed the procedure including the risks, benefits and alternatives for the proposed anesthesia with the patient or authorized representative who has indicated his/her understanding and acceptance.       Plan Discussed with: CRNA  Anesthesia Plan Comments:         Anesthesia Quick Evaluation

## 2018-12-24 NOTE — Op Note (Signed)
[facility] Patient Name: Jeffery Davis Procedure Date: 12/24/2018 10:46 AM MRN: [id number] Account #: [id number] Date of Birth: 1945/12/15 Admit Type: Outpatient Age: 73 Room: [facility] ENDO ROOM 2 Gender: Male Note Status: Finalized Procedure:            Upper GI endoscopy Indications:          Surveillance for malignancy due to personal history of                        Barrett's esophagus Providers:            Benay Pike. Alice Reichert MD, MD Referring MD:         Janine Ores. Rosanna Randy, MD (Referring MD) Medicines:            Propofol per Anesthesia Complications:        No immediate complications. Procedure:            Pre-Anesthesia Assessment:                       - The risks and benefits of the procedure and the                        sedation options and risks were discussed with the                        patient. All questions were answered and informed                        consent was obtained.                       - Patient identification and proposed procedure were                        verified prior to the procedure by the nurse. The                        procedure was verified in the procedure room.                       - ASA Grade Assessment: II - A patient with mild                        systemic disease.                       - After reviewing the risks and benefits, the patient                        was deemed in satisfactory condition to undergo the                        procedure.                       After obtaining informed consent, the endoscope was                        passed under direct vision. Throughout the procedure,  the patient's blood pressure, pulse, and oxygen                        saturations were monitored continuously. The Endoscope                        was introduced through the mouth, and advanced to the                        third part of duodenum. The upper GI endoscopy was                   accomplished without difficulty. The patient tolerated                        the procedure well. Findings:      There were esophageal mucosal changes secondary to established Barrett's       disease present at the gastroesophageal junction. The maximum       longitudinal extent of these mucosal changes was 0.5 cm in length.       Mucosa was biopsied with a cold forceps for histology in 4 quadrants at       the gastroesophageal junction. One specimen bottle was sent to pathology.      A 3 cm hiatal hernia was present.      Patchy moderate inflammation characterized by erythema was found in the       gastric antrum.      The examined duodenum was normal. Impression:           - Esophageal mucosal changes secondary to established                        Barrett's disease. Biopsied.                       - 3 cm hiatal hernia.                       - Gastritis.                       - Normal examined duodenum. Recommendation:       - Await pathology results.                       - Patient has a contact number available for                        emergencies. The signs and symptoms of potential                        delayed complications were discussed with the patient.                        Return to normal activities tomorrow. Written discharge                        instructions were provided to the patient.                       - Resume previous diet.                       -  Continue present medications.                       - Repeat upper endoscopy after studies are complete for                        surveillance.                       - Return to GI office in 1 year. Procedure Code(s):    --- Professional ---                       [id number], Esophagogastroduodenoscopy, flexible, transoral;                        with biopsy, single or multiple Diagnosis Code(s):    --- Professional ---                       K29.70, Gastritis, unspecified, without bleeding                        K44.9, Diaphragmatic hernia without obstruction or                        gangrene                       K22.70, Barrett's esophagus without dysplasia CPT copyright 2019 American Medical Association. All rights reserved. The codes documented in this report are preliminary and upon coder review may  be revised to meet current compliance requirements. Efrain Sella MD, MD 12/24/2018 11:05:01 AM This report has been signed electronically. Number of Addenda: 0 Note Initiated On: 12/24/2018 10:46 AM Estimated Blood Loss: Estimated blood loss: none.      [facility]

## 2018-12-24 NOTE — Anesthesia Post-op Follow-up Note (Signed)
Anesthesia QCDR form completed.        

## 2018-12-24 NOTE — Anesthesia Postprocedure Evaluation (Signed)
Anesthesia Post Note  Patient: Jeffery Davis  Procedure(s) Performed: ESOPHAGOGASTRODUODENOSCOPY (EGD) WITH PROPOFOL (N/A )  Patient location during evaluation: Endoscopy Anesthesia Type: General Level of consciousness: awake and alert Pain management: pain level controlled Vital Signs Assessment: post-procedure vital signs reviewed and stable Respiratory status: spontaneous breathing, nonlabored ventilation, respiratory function stable and patient connected to nasal cannula oxygen Cardiovascular status: blood pressure returned to baseline and stable Postop Assessment: no apparent nausea or vomiting Anesthetic complications: no     Last Vitals:  Vitals:   12/24/18 1122 12/24/18 1132  BP: (!) 152/84 (!) 165/92  Pulse: (!) 58 (!) 56  Resp: 14 14  Temp:    SpO2: 99% 99%    Last Pain:  Vitals:   12/24/18 1132  TempSrc:   PainSc: 0-No pain                 Pennelope Basque S

## 2018-12-24 NOTE — Progress Notes (Signed)
Carelink Summary Report / Loop Recorder 

## 2018-12-24 NOTE — Transfer of Care (Signed)
Immediate Anesthesia Transfer of Care Note  Patient: Jeffery Davis  Procedure(s) Performed: ESOPHAGOGASTRODUODENOSCOPY (EGD) WITH PROPOFOL (N/A )  Patient Location: PACU  Anesthesia Type:General  Level of Consciousness: awake and drowsy  Airway & Oxygen Therapy: Patient Spontanous Breathing and Patient connected to face mask oxygen  Post-op Assessment: Report given to RN and Post -op Vital signs reviewed and stable  Post vital signs: Reviewed and stable  Last Vitals:  Vitals Value Taken Time  BP    Temp    Pulse    Resp    SpO2      Last Pain:  Vitals:   12/24/18 1022  TempSrc: Tympanic  PainSc: 0-No pain         Complications: No apparent anesthesia complications

## 2018-12-25 ENCOUNTER — Encounter: Payer: Self-pay | Admitting: Internal Medicine

## 2018-12-25 LAB — SURGICAL PATHOLOGY

## 2018-12-27 DIAGNOSIS — L820 Inflamed seborrheic keratosis: Secondary | ICD-10-CM | POA: Diagnosis not present

## 2018-12-27 DIAGNOSIS — D692 Other nonthrombocytopenic purpura: Secondary | ICD-10-CM | POA: Diagnosis not present

## 2018-12-27 DIAGNOSIS — D485 Neoplasm of uncertain behavior of skin: Secondary | ICD-10-CM | POA: Diagnosis not present

## 2018-12-27 DIAGNOSIS — L565 Disseminated superficial actinic porokeratosis (DSAP): Secondary | ICD-10-CM | POA: Diagnosis not present

## 2018-12-27 DIAGNOSIS — L570 Actinic keratosis: Secondary | ICD-10-CM | POA: Diagnosis not present

## 2018-12-27 DIAGNOSIS — L821 Other seborrheic keratosis: Secondary | ICD-10-CM | POA: Diagnosis not present

## 2019-01-02 ENCOUNTER — Telehealth: Payer: Self-pay | Admitting: Family Medicine

## 2019-01-02 NOTE — Chronic Care Management (AMB) (Signed)
Chronic Care Management  ° °Note ° °01/02/2019 °Name: Jeffery Davis MRN: [id number] DOB: 07/12/1945 ° °Jeffery Davis is a 73 y.o. year old adult who is a primary care patient of Gilbert, Richard L Jr., MD. I reached out to Amiere M Mineer by phone today in response to a referral sent by Jeffery Davis's health plan.    ° °Jeffery Davis was given information about Chronic Care Management services today including:  °1. CCM service includes personalized support from designated clinical staff supervised by his physician, including individualized plan of care and coordination with other care providers °2. 24/7 contact phone numbers for assistance for urgent and routine care needs. °3. Service will only be billed when office clinical staff spend 20 minutes or more in a month to coordinate care. °4. Only one practitioner may furnish and bill the service in a calendar month. °5. The patient may stop CCM services at any time (effective at the end of the month) by phone call to the office staff. °6. The patient will be responsible for cost sharing (co-pay) of up to 20% of the service fee (after annual deductible is met). ° °Patient agreed to services and verbal consent obtained.  ° °Follow up plan: °Telephone appointment with CCM team member scheduled for: 01/17/2019 ° °Bernice Cicero °Care Guide • [facility] °[place]   [facility]  °[email or web address]   ??[phone]   ° ° ° °

## 2019-01-17 ENCOUNTER — Ambulatory Visit: Payer: PPO

## 2019-01-17 DIAGNOSIS — I10 Essential (primary) hypertension: Secondary | ICD-10-CM

## 2019-01-17 DIAGNOSIS — I4819 Other persistent atrial fibrillation: Secondary | ICD-10-CM

## 2019-01-18 NOTE — Chronic Care Management (AMB) (Signed)
Chronic Care Management   Initial Visit Note   Name: Jeffery Davis MRN: [id number] DOB: [birth year]  Referred by: Jeffery Davis., MD Reason for referral : Chronic Care Management   Jeffery Davis is a 73 y.o. year old adult who is a primary care patient of Jeffery Davis., MD. The CCM team was consulted for assistance with chronic disease management and care coordination needs. The primary focus of our discussion today was blood pressure management.   Review of patient status, including review of consultant reports, relevant laboratory and other test results. Collaboration with appropriate care team members and the patient's provider was performed as part of comprehensive patient evaluation and provision of chronic care management services.    Medications: Outpatient Encounter Medications as of 01/17/2019  Medication Sig  . atorvastatin (LIPITOR) 10 MG tablet TAKE ONE TABLET BY MOUTH EVERY OTHER DAY  . Cholecalciferol (VITAMIN D3) 5000 units TABS Take 5,000 Units by mouth Davis.  . diphenhydramine-acetaminophen (TYLENOL PM) 25-500 MG TABS tablet Take 2 tablets by mouth at bedtime as needed (sleep).  . Coenzyme Q10 (CO Q 10) 100 MG CAPS Take 100 mg by mouth 2 (two) times Davis.   Marland Kitchen ketoconazole (NIZORAL) 2 % cream Apply 1 application topically every other day.  . Misc Natural Products (GLUCOSAMINE CHONDROITIN TRIPLE) TABS Take 1 tablet by mouth 2 (two) times Davis.  Marland Kitchen omeprazole (PRILOSEC) 20 MG capsule TAKE 1 CAPSULE EVERY DAY (Patient taking differently: Take 20 mg by mouth every other day. TAKE 1 CAPSULE EVERY DAY)  . rivaroxaban (XARELTO) 20 MG TABS tablet Take 20 mg by mouth Davis with supper.  . sildenafil (REVATIO) 20 MG tablet TAKE ONE TABLET BY MOUTH 3 TIMES Davis AS NEEDED  . telmisartan (MICARDIS) 20 MG tablet Take 20 mg by mouth Davis.    No facility-administered encounter medications on file as of 01/17/2019.      Objective:  BP Readings from Last 3  Encounters:  12/24/18 (!) 165/92  12/03/18 (!) 152/89  10/08/18 129/62    Goals Addressed            This Visit's Progress   . Blood Pressure Management       Current Barriers:  . Chronic Disease Management education and support related to hypertension self management.  Case Manager Clinical Goal(s):  Marland Kitchen Over the next 60 days, patient will verbalize understanding of plan for hypertension management. . Over the next 60 days, patient will demonstrate adherence to prescribed treatment plan for hypertension as evidenced by taking all medications as prescribed, monitoring and recording blood pressure as directed, and adhering to a cardiac prudent diet.  Interventions:  . Reviewed medications, prescribed doses and indications for use. Patient encouraged to continue taking medications as prescribed and notify MD if unable to tolerate prescribed regimen. . Advised patient to monitor blood pressure and maintain a BP log to identify trends. Patient reports monitoring his BP every few weeks. Encouraged to monitor at least three times a week if unable to monitor Davis. Discussed recommended parameters and indications for notifying his primary care provider. . Encouraged to continue adherence with cardiac prudent diet. . Encouraged to continue mild exercises. Patient reports walking approximately 4-6 miles a day. Reports meeting his weight loss goal of 20lbs. He is doing very well with weight maintenance. . Reviewed schedule and pending appointments. Encouraged to attend appointments as scheduled to prevent delays in care.   Patient Self Care Activities:  . Self administers medications as prescribed .  Attends provider appointments . Calls pharmacy for medication refills . Performs ADL's independently . Performs IADL's independently . Calls provider office for new concerns or questions  Initial goal documentation        . DIET - REDUCE PORTION SIZE   On track    Recommend decreasing  portion sizes by half and eating 3 small meals a day with two healthy snacks in between.     . Reduce salt intake to 2 grams per day or less   On track    Reduce salt intake to 2 grams per day or less        PLAN The care management team will follow-up with [name] next month. Contact information was provided. He was encouraged to call with questions/concerns if needed prior to the scheduled follow-up.   [facility] Care Management [phone]

## 2019-01-19 LAB — CUP PACEART REMOTE DEVICE CHECK
Date Time Interrogation Session: 20201107110037
Implantable Pulse Generator Implant Date: 20200217

## 2019-01-21 ENCOUNTER — Ambulatory Visit (INDEPENDENT_AMBULATORY_CARE_PROVIDER_SITE_OTHER): Payer: PPO | Admitting: *Deleted

## 2019-01-21 ENCOUNTER — Telehealth: Payer: Self-pay | Admitting: Internal Medicine

## 2019-01-21 DIAGNOSIS — I480 Paroxysmal atrial fibrillation: Secondary | ICD-10-CM

## 2019-01-21 NOTE — Telephone Encounter (Signed)
  Patient states that at last appointment with Dr Rayann Heman he was told that an appointment would be made for him with Butch Penny in the [facility] clinic. He has not heard anything regarding this and would like to see about having that scheduled.

## 2019-01-21 NOTE — Telephone Encounter (Signed)
Called and spoke with patient, he is aware of appt scheduled 06/03/2019. Nothing further needed.

## 2019-01-24 NOTE — Patient Instructions (Addendum)
Goals Addressed            This Visit's Progress   . Blood Pressure Management       Current Barriers:  . Chronic Disease Management education and support related to hypertension self management.  Case Manager Clinical Goal(s):  Marland Kitchen Over the next 60 days, patient will verbalize understanding of plan for hypertension management. . Over the next 60 days, patient will demonstrate adherence to prescribed treatment plan for hypertension as evidenced by taking all medications as prescribed, monitoring and recording blood pressure as directed, and adhering to a cardiac prudent diet.  Interventions:  . Reviewed medications, prescribed doses and indications for use. Patient encouraged to continue taking medications as prescribed and notify MD if unable to tolerate prescribed regimen. . Advised patient to monitor blood pressure and maintain a BP log to identify trends. Patient reports monitoring his BP every few weeks. Encouraged to monitor at least three times a week if unable to monitor daily. Discussed recommended parameters and indications for notifying his primary care provider. . Encouraged to continue adherence with cardiac prudent diet. . Encouraged to continue mild exercises. Patient reports walking approximately 4-6 miles a day. Reports meeting his weight loss goal of 20lbs. He is doing very well with weight maintenance. . Reviewed schedule and pending appointments. Encouraged to attend appointments as scheduled to prevent delays in care.   Patient Self Care Activities:  . Self administers medications as prescribed . Attends provider appointments . Calls pharmacy for medication refills . Performs ADL's independently . Performs IADL's independently . Calls provider office for new concerns or questions  Initial goal documentation        . DIET - REDUCE PORTION SIZE   On track    Recommend decreasing portion sizes by half and eating 3 small meals a day with two healthy snacks in  between.     . Reduce salt intake to 2 grams per day or less   On track    Reduce salt intake to 2 grams per day or less       The patient verbalized understanding of the instructions discussed during our telephonic outreach today. He did not request a copy of the printed instructions.  A telephonic outreach  is scheduled for 02/21/19.   [facility] Care Management [phone]

## 2019-02-08 IMAGING — CT CT L SPINE W/ CM
1 of 6 series · 5 of 14 positions shown, 7 images · non-contrast
Comparison: MRI lumbar spine 09/14/2016.

CLINICAL DATA: Low back and RIGHT leg pain.
TECHNIQUE: Contiguous axial images were obtained through the Lumbar spine after
the intrathecal infusion of infusion. Coronal and sagittal
reconstructions were obtained of the axial image sets.

[Series 3: l spine soft · axial · 0.27mm/px · z∈[-270,-112]mm · 5 of 81 slices shown, 7 images]
[im 14/81  soft-tissue]
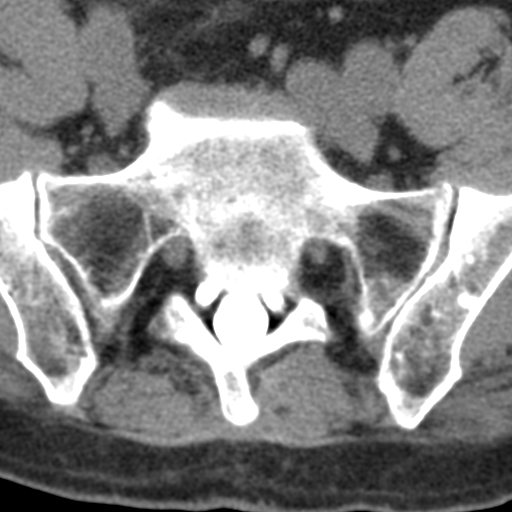
[im 14/81  bone]
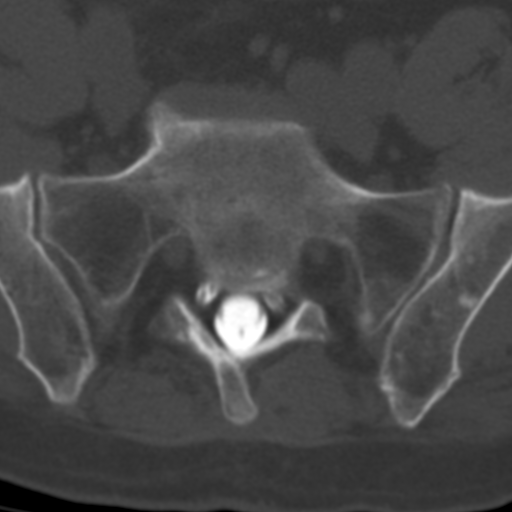
[im 27/81  bone]
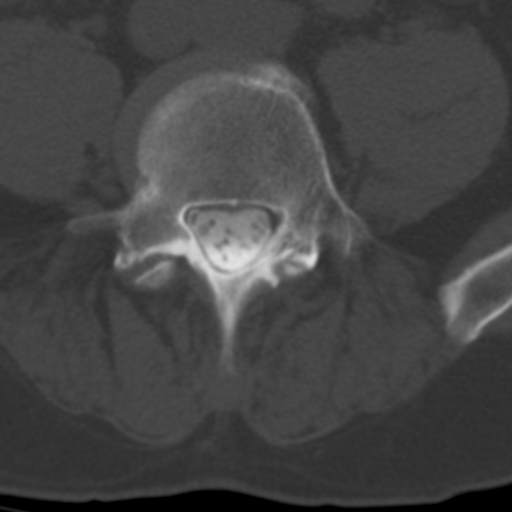
[im 41/81  bone]
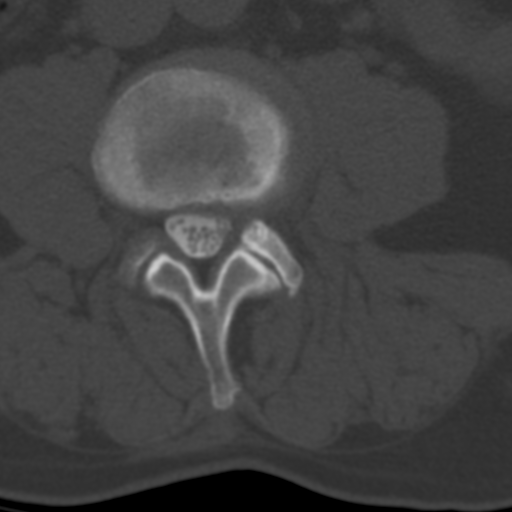
[im 54/81  bone]
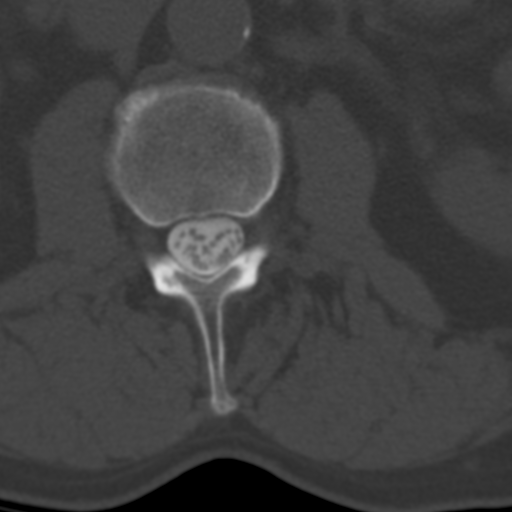
[im 67/81  soft-tissue]
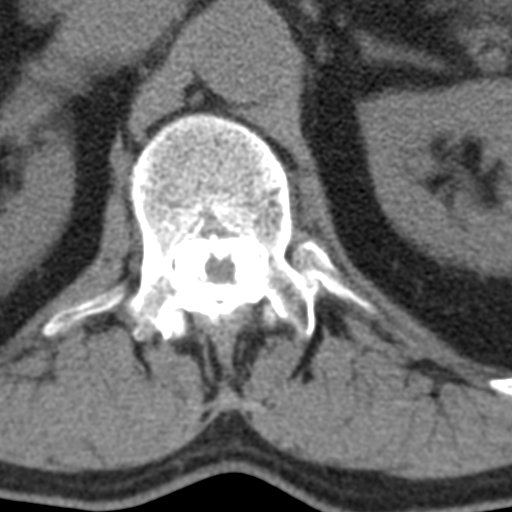
[im 67/81  bone]
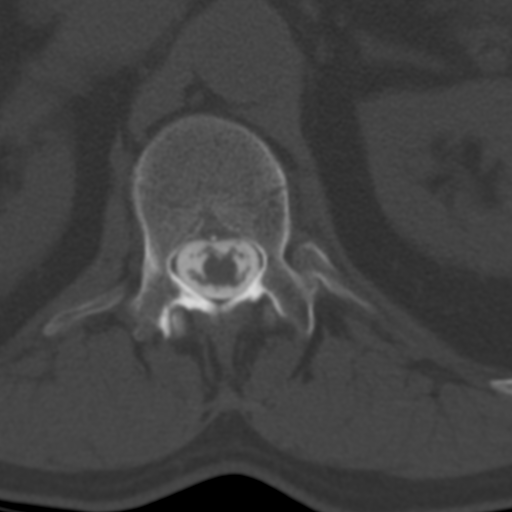

[5 of 14 positions shown; findings below may reference images not displayed]

EXAM:
LUMBAR MYELOGRAM

FLUOROSCOPY TIME:  32 seconds corresponding to a Dose Area Product
of 288.29 ?Gy*m2

PROCEDURE:
After thorough discussion of risks and benefits of the procedure
including bleeding, infection, injury to nerves, blood vessels,
adjacent structures as well as headache and CSF leak, written and
oral informed consent was obtained. Consent was obtained by Dr. Ryoichiro
Asuelimen. Time out form was completed.

Patient was positioned prone on the fluoroscopy table. Local
anesthesia was provided with 1% lidocaine without epinephrine after
prepped and draped in the usual sterile fashion. Puncture was
performed at L3-L4 using a 3 1/2 inch 22-gauge spinal needle via
RIGHT paramedian approach. Using a single pass through the dura, the
needle was placed within the thecal sac, with return of clear CSF.
15 mL of Isovue-M 200 was injected into the thecal sac, with normal
opacification of the nerve roots and cauda equina consistent with
free flow within the subarachnoid space.

I personally performed the lumbar puncture and administered the
intrathecal contrast. I also personally supervised acquisition of
the myelogram images.
FINDINGS: LUMBAR MYELOGRAM FINDINGS:

Good opacification lumbar subarachnoid space. Degenerative scoliosis
convex RIGHT mid lumbar region.

Asymmetric loss of interspace height at L3-4 on the LEFT. Mild
stenosis at this level with LEFT L4 nerve root impingement. Mild
subarticular zone narrowing at L4-5 associated with generalized loss
of interspace height and a ventral soft tissue defect. Mild
subarticular zone narrowing L5-S1 on the RIGHT, which appears
compensatory for the degenerative scoliosis at the L3-4 level.
Osseous spurring at L5-S1 extends to the RIGHT.

Subarticular zone narrowing is also present at L4-5 bilaterally,
RIGHT greater than LEFT, where there is RIGHT L5 nerve root
impingement. This is less apparent with patient standing.

Anatomic alignment. Standing flexion extension radiographs
demonstrate no dynamic instability.

CT LUMBAR MYELOGRAM FINDINGS:

Segmentation: Normal.

Alignment: No anterolisthesis or retrolisthesis of significance.
15-20 degrees degenerative scoliosis convex RIGHT centered at L3.

Vertebrae: No worrisome osseous lesion.

Conus medullaris: Slightly low termination mid L2.  Normal in size.

Paraspinal tissues: No evidence for hydronephrosis or paravertebral
mass.

Disc levels:

L1-L2:  Unremarkable.

L2-L3:  Annular bulge.  No impingement.

L3-L4: Central and leftward protrusion with osseous spurring
eccentric to the LEFT. Asymmetric loss of interspace height on the
LEFT. Mild subarticular zone and foraminal zone narrowing. LEFT L4
and LEFT L3 nerve root impingement.

L4-L5: Vacuum phenomenon. Shallow central protrusion. Mild facet
arthropathy. No definite impingement.

L5-S1: Central and rightward protrusion extends to the foramen where
there is osseous spurring. Facet arthropathy is worse on the RIGHT.
Large extraforaminal spur extends into the paraspinous soft tissues
on the RIGHT. RIGHT L5 and RIGHT S1 nerve root impingement are
noted.

Compared with prior MR, similar appearance.
IMPRESSION: LUMBAR MYELOGRAM IMPRESSION:

Degenerative scoliosis convex RIGHT at L3-4 due to asymmetric
leftward loss of interspace height. LEFT greater than RIGHT L4 nerve
root impingement.

Compensatory narrowing of the subarticular zone and foraminal zone
at L5-S1 on the RIGHT contributes to mild mass effect in the
subarticular zone, but no frank S1 nerve root cut off.

Ventral defect and subarticular zone narrowing at L4-5, worse on the
RIGHT.

No subluxation or dynamic instability on standing flexion extension
radiographs.

CT LUMBAR MYELOGRAM IMPRESSION:

The dominant RIGHT-sided abnormality is at L5-S1 where central and
rightward protrusion with osseous spurring results in RIGHT L5 and
S1 nerve root impingement. No other significant RIGHT-sided
compressive lesion.

Myelographic findings of disc space narrowing and RIGHT greater than
LEFT subarticular zone narrowing at L4-5 are unimpressive on post
myelogram CT.

Central and leftward protrusion at L3-4 with bony overgrowth. LEFT
L4 and LEFT L3 nerve root impingement are noted.

## 2019-02-11 DIAGNOSIS — M25512 Pain in left shoulder: Secondary | ICD-10-CM | POA: Diagnosis not present

## 2019-02-16 NOTE — Progress Notes (Signed)
Carelink Summary Report / Loop Recorder 

## 2019-02-21 ENCOUNTER — Other Ambulatory Visit: Payer: Self-pay

## 2019-02-21 ENCOUNTER — Ambulatory Visit (INDEPENDENT_AMBULATORY_CARE_PROVIDER_SITE_OTHER): Payer: PPO | Admitting: *Deleted

## 2019-02-21 ENCOUNTER — Ambulatory Visit: Payer: Self-pay

## 2019-02-21 DIAGNOSIS — I4819 Other persistent atrial fibrillation: Secondary | ICD-10-CM

## 2019-02-21 LAB — CUP PACEART REMOTE DEVICE CHECK
Date Time Interrogation Session: 20201210122931
Implantable Pulse Generator Implant Date: 20200217

## 2019-03-11 ENCOUNTER — Other Ambulatory Visit: Payer: Self-pay

## 2019-03-11 ENCOUNTER — Ambulatory Visit (INDEPENDENT_AMBULATORY_CARE_PROVIDER_SITE_OTHER): Payer: PPO | Admitting: Family Medicine

## 2019-03-11 ENCOUNTER — Encounter: Payer: Self-pay | Admitting: Family Medicine

## 2019-03-11 VITALS — BP 163/91 | HR 61 | Temp 96.9°F | Resp 18 | Ht 68.0 in | Wt 169.0 lb

## 2019-03-11 DIAGNOSIS — I4819 Other persistent atrial fibrillation: Secondary | ICD-10-CM | POA: Diagnosis not present

## 2019-03-11 DIAGNOSIS — I824Y2 Acute embolism and thrombosis of unspecified deep veins of left proximal lower extremity: Secondary | ICD-10-CM | POA: Diagnosis not present

## 2019-03-11 DIAGNOSIS — I10 Essential (primary) hypertension: Secondary | ICD-10-CM

## 2019-03-11 DIAGNOSIS — R079 Chest pain, unspecified: Secondary | ICD-10-CM | POA: Diagnosis not present

## 2019-03-11 DIAGNOSIS — I429 Cardiomyopathy, unspecified: Secondary | ICD-10-CM

## 2019-03-11 MED ORDER — AMLODIPINE BESYLATE 5 MG PO TABS: 5.0000 mg | ORAL_TABLET | Freq: Every day | ORAL | 3 refills | Status: DC

## 2019-03-11 NOTE — Progress Notes (Signed)
Patient: Jeffery Davis Adult    DOB: 04-Nov-1945   73 y.o.   MRN: [id number] Visit Date: 03/11/2019  Today's Provider: [name], MD   Chief Complaint  Patient presents with  . Chest Pain  . Dizziness  . Hypertension   Subjective:     HPI  Patient has been having elevated blood pressure. lightheadedness and upper chest pain since mid-day yesterday. No shortness of breath, no fever.  No leg swelling.  No chest pain today, specifically the patient walked 4 miles with his wife today with no problem.  He is off Xarelto for atrial fib.  Also history of DVT.  He has no lower extremity edema today. No fever, cough, unexpected fatigue or myalgias.  No known Covid exposure.  He states he and his wife been very careful  No Known Allergies   Current Outpatient Medications:  .  atorvastatin (LIPITOR) 10 MG tablet, TAKE ONE TABLET BY MOUTH EVERY OTHER DAY, Disp: 45 tablet, Rfl: 11 .  Cholecalciferol (VITAMIN D3) 5000 units TABS, Take 5,000 Units by mouth daily., Disp: , Rfl:  .  Coenzyme Q10 (CO Q 10) 100 MG CAPS, Take 100 mg by mouth 2 (two) times daily. , Disp: , Rfl:  .  diphenhydramine-acetaminophen (TYLENOL PM) 25-500 MG TABS tablet, Take 2 tablets by mouth at bedtime as needed (sleep)., Disp: , Rfl:  .  ketoconazole (NIZORAL) 2 % cream, Apply 1 application topically every other day., Disp: , Rfl:  .  Misc Natural Products (GLUCOSAMINE CHONDROITIN TRIPLE) TABS, Take 1 tablet by mouth 2 (two) times daily., Disp: , Rfl:  .  omeprazole (PRILOSEC) 20 MG capsule, TAKE 1 CAPSULE EVERY DAY (Patient taking differently: Take 20 mg by mouth every other day. TAKE 1 CAPSULE EVERY DAY), Disp: 90 capsule, Rfl: 3 .  sildenafil (REVATIO) 20 MG tablet, TAKE ONE TABLET BY MOUTH 3 TIMES DAILY AS NEEDED, Disp: 90 tablet, Rfl: 3 .  telmisartan (MICARDIS) 20 MG tablet, Take 20 mg by mouth daily. , Disp: , Rfl:  .  amLODipine (NORVASC) 5 MG tablet, Take 1 tablet (5 mg total) by mouth daily.,  Disp: 30 tablet, Rfl: 3 .  rivaroxaban (XARELTO) 20 MG TABS tablet, Take 20 mg by mouth daily with supper., Disp: , Rfl:   Review of Systems  Constitutional: Negative for appetite change, chills and fever.  Eyes: Negative.   Respiratory: Negative for chest tightness, shortness of breath and wheezing.   Cardiovascular: Positive for chest pain. Negative for palpitations.  Gastrointestinal: Negative for abdominal pain, nausea and vomiting.  Endocrine: Negative.   Skin: Negative.   Allergic/Immunologic: Negative.   Neurological: Positive for light-headedness.  Psychiatric/Behavioral: Negative.     Social History   Tobacco Use  . Smoking status: Never Smoker  . Smokeless tobacco: Never Used  Substance Use Topics  . Alcohol use: Yes    Alcohol/week: 7.0 - 14.0 standard drinks    Types: 7 - 14 Glasses of wine per week    Comment: 1-2 glasses of wine daily      Objective:   BP (!) 175/95 (BP Location: Right Arm, Patient Position: Sitting, Cuff Size: Large)   Pulse (!) 59   Temp (!) 96.9 F (36.1 C) (Other (Comment))   Resp 18   Ht 5\' 8"  (1.727 m)   Wt 169 lb (76.7 kg)   SpO2 97%   BMI 25.70 kg/m  Vitals:   03/11/19 1121  BP: (!) 175/95  Pulse: Marland Kitchen)  59  Resp: 18  Temp: (!) 96.9 F (36.1 C)  TempSrc: Other (Comment)  SpO2: 97%  Weight: 169 lb (76.7 kg)  Height: 5\' 8"  (1.727 m)  Body mass index is 25.7 kg/m.   Physical Exam Vitals reviewed.  Constitutional:      Appearance: He is well-developed.  HENT:     Head: Normocephalic and atraumatic.     Right Ear: External ear normal.     Left Ear: External ear normal.     Nose: Nose normal.  Eyes:     General: No scleral icterus.    Conjunctiva/sclera: Conjunctivae normal.     Comments: Right pupil 1 mm smaller than left.  Neck:     Thyroid: No thyromegaly.  Cardiovascular:     Rate and Rhythm: Normal rate and regular rhythm.     Pulses: Normal pulses.     Heart sounds: Normal heart sounds.  Pulmonary:      Effort: Pulmonary effort is normal.     Breath sounds: Normal breath sounds.  Abdominal:     Palpations: Abdomen is soft.  Musculoskeletal:     Right lower leg: No edema.     Left lower leg: No edema.  Lymphadenopathy:     Cervical: No cervical adenopathy.  Skin:    General: Skin is warm and dry.  Neurological:     General: No focal deficit present.     Mental Status: He is alert and oriented to person, place, and time. Mental status is at baseline.  Psychiatric:        Mood and Affect: Mood normal.        Behavior: Behavior normal.        Thought Content: Thought content normal.        Judgment: Judgment normal.   2 ECGs obtained.  First appeared to have some inferior ischemic changes.  Second this was normalized.  No atrial fibrillation.  There is some repolarization changes from blood pressure probably   No results found for any visits on 03/11/19.     Assessment & Plan    1. Chest pain, unspecified type Added amlodipine 5 mg daily.  Patient on aspirin.  Labs obtained.  Refer back to Dr. Ubaldo Glassing from cardiology. This pain was nonexertional.More than 50% of 30 minute visit spent in counseling or coordination of care  - EKG 12-Lead - CBC w/Diff/Platelet - Renal function panel - Troponin I  2. HTN, goal below 140/80 Added amlodipine 5 mg daily.  - amLODipine (NORVASC) 5 MG tablet; Take 1 tablet (5 mg total) by mouth daily.  Dispense: 30 tablet; Refill: 3  - CBC w/Diff/Platelet - Renal function panel - Troponin I  3. Persistent atrial fibrillation ([place]) Per EP  4. Cardiomyopathy, unspecified type ([place]) Followed by Dr. Ubaldo Glassing  5. Acute deep vein thrombosis (DVT) of proximal vein of left lower extremity ([place]) This occurred after orthopedic surgery.  No leg swelling today.  No signs of DVT or PE.  O2 sats 97% 6.Headache Patient told if he develops any other symptoms we will need to obtain a Covid test Follow up in one month.      I,Cleophus Mendonsa,acting as a scribe  for [name], MD.,have documented all relevant documentation on the behalf of [name], MD,as directed by  [name], MD while in the presence of [name], MD.      [name], MD  [facility]

## 2019-03-12 ENCOUNTER — Ambulatory Visit: Payer: Self-pay | Admitting: Family Medicine

## 2019-03-12 LAB — RENAL FUNCTION PANEL
Albumin: 4.5 g/dL (ref 3.7–4.7)
BUN/Creatinine Ratio: 22 (ref 10–24)
BUN: 20 mg/dL (ref 8–27)
CO2: 26 mmol/L (ref 20–29)
Calcium: 9.2 mg/dL (ref 8.6–10.2)
Chloride: 100 mmol/L (ref 96–106)
Creatinine, Ser: 0.9 mg/dL (ref 0.76–1.27)
GFR calc Af Amer: 98 mL/min/{1.73_m2} (ref >59)
GFR calc non Af Amer: 84 mL/min/{1.73_m2} (ref >59)
Glucose: 88 mg/dL (ref 65–99)
Phosphorus: 3.5 mg/dL (ref 2.8–4.1)
Potassium: 4.4 mmol/L (ref 3.5–5.2)
Sodium: 139 mmol/L (ref 134–144)

## 2019-03-12 LAB — CBC WITH DIFFERENTIAL/PLATELET
Basophils Absolute: 0 10*3/uL (ref 0.0–0.2)
Basos: 1 %
EOS (ABSOLUTE): 0.2 10*3/uL (ref 0.0–0.4)
Eos: 3 %
Hematocrit: 40.7 % (ref 37.5–51.0)
Hemoglobin: 14.1 g/dL (ref 13.0–17.7)
Immature Grans (Abs): 0 10*3/uL (ref 0.0–0.1)
Immature Granulocytes: 0 %
Lymphocytes Absolute: 2.2 10*3/uL (ref 0.7–3.1)
Lymphs: 36 %
MCH: 33.1 pg — ABNORMAL HIGH (ref 26.6–33.0)
MCHC: 34.6 g/dL (ref 31.5–35.7)
MCV: 96 fL (ref 79–97)
Monocytes Absolute: 0.6 10*3/uL (ref 0.1–0.9)
Monocytes: 10 %
Neutrophils Absolute: 3.1 10*3/uL (ref 1.4–7.0)
Neutrophils: 50 %
Platelets: 242 10*3/uL (ref 150–450)
RBC: 4.26 x10E6/uL (ref 4.14–5.80)
RDW: 12.7 % (ref 11.6–15.4)
WBC: 6.1 10*3/uL (ref 3.4–10.8)

## 2019-03-12 LAB — TROPONIN I: Troponin I: <0.01 ng/mL (ref 0.00–0.04)

## 2019-03-19 DIAGNOSIS — I480 Paroxysmal atrial fibrillation: Secondary | ICD-10-CM | POA: Diagnosis not present

## 2019-03-19 DIAGNOSIS — I428 Other cardiomyopathies: Secondary | ICD-10-CM | POA: Diagnosis not present

## 2019-03-19 DIAGNOSIS — I10 Essential (primary) hypertension: Secondary | ICD-10-CM | POA: Diagnosis not present

## 2019-03-26 ENCOUNTER — Ambulatory Visit (INDEPENDENT_AMBULATORY_CARE_PROVIDER_SITE_OTHER): Payer: PPO | Admitting: *Deleted

## 2019-03-26 DIAGNOSIS — I4819 Other persistent atrial fibrillation: Secondary | ICD-10-CM

## 2019-03-26 LAB — CUP PACEART REMOTE DEVICE CHECK
Date Time Interrogation Session: 20210112123039
Implantable Pulse Generator Implant Date: 20200217

## 2019-03-26 NOTE — Progress Notes (Signed)
ILR remote 

## 2019-04-02 DIAGNOSIS — I428 Other cardiomyopathies: Secondary | ICD-10-CM | POA: Diagnosis not present

## 2019-04-09 NOTE — Progress Notes (Signed)
Patient: Jeffery Davis Adult    DOB: [birth year]   74 y.o.   MRN: [id number] Visit Date: 04/11/2019  Today's Provider: [name], MD   Chief Complaint  Patient presents with  . Hypertension   Subjective:     HPI  Patient overall has been feeling fairly well.  His complaint today is 1 of progressive ED.  He is able to get an erection firm enough for penetration but lately more frequently loses the erection during intercourse.  He has tolerated Viagra well. Chest pain, unspecified type From 03/11/2019-Added amlodipine 5 mg daily.  Patient on aspirin.  Labs obtained.  Referred back to Dr. Ubaldo Glassing from cardiology.  Work-up was negative.  HTN, goal below 140/80 From 03/11/2019-Added amlodipine 5 mg daily.  Headache From 03/11/2019-Patient told if he develops any other symptoms we will need to obtain a Covid test. Follow up in one month.  HLD On Lipitor No Known Allergies   Current Outpatient Medications:  .  amLODipine (NORVASC) 5 MG tablet, Take 1 tablet (5 mg total) by mouth daily., Disp: 30 tablet, Rfl: 3 .  atorvastatin (LIPITOR) 10 MG tablet, TAKE ONE TABLET BY MOUTH EVERY OTHER DAY, Disp: 45 tablet, Rfl: 11 .  Cholecalciferol (VITAMIN D3) 5000 units TABS, Take 5,000 Units by mouth daily., Disp: , Rfl:  .  Coenzyme Q10 (CO Q 10) 100 MG CAPS, Take 100 mg by mouth 2 (two) times daily. , Disp: , Rfl:  .  diphenhydramine-acetaminophen (TYLENOL PM) 25-500 MG TABS tablet, Take 2 tablets by mouth at bedtime as needed (sleep)., Disp: , Rfl:  .  ketoconazole (NIZORAL) 2 % cream, Apply 1 application topically every other day., Disp: , Rfl:  .  Misc Natural Products (GLUCOSAMINE CHONDROITIN TRIPLE) TABS, Take 1 tablet by mouth 2 (two) times daily., Disp: , Rfl:  .  omeprazole (PRILOSEC) 20 MG capsule, TAKE 1 CAPSULE EVERY DAY (Patient taking differently: Take 20 mg by mouth every other day. TAKE 1 CAPSULE EVERY DAY), Disp: 90 capsule, Rfl: 3 .  sildenafil (REVATIO) 20 MG  tablet, TAKE ONE TABLET BY MOUTH 3 TIMES DAILY AS NEEDED, Disp: 90 tablet, Rfl: 3 .  rivaroxaban (XARELTO) 20 MG TABS tablet, Take 20 mg by mouth daily with supper., Disp: , Rfl:  .  telmisartan (MICARDIS) 20 MG tablet, Take 20 mg by mouth daily. , Disp: , Rfl:   Review of Systems  Constitutional: Negative for appetite change, chills, fatigue and fever.  HENT: Negative.   Eyes: Negative.   Respiratory: Negative for chest tightness and shortness of breath.   Cardiovascular: Negative for chest pain and palpitations.  Gastrointestinal: Negative for abdominal pain, nausea and vomiting.  Endocrine: Negative.   Genitourinary:       ED  Skin: Negative.   Neurological: Negative for dizziness and weakness.  Hematological: Negative.   Psychiatric/Behavioral: Negative.     Social History   Tobacco Use  . Smoking status: Never Smoker  . Smokeless tobacco: Never Used  Substance Use Topics  . Alcohol use: Yes    Alcohol/week: 7.0 - 14.0 standard drinks    Types: 7 - 14 Glasses of wine per week    Comment: 1-2 glasses of wine daily      Objective:   BP 130/82 (BP Location: Right Arm, Patient Position: Sitting, Cuff Size: Large)   Pulse 65   Temp (!) 95.5 F (35.3 C) (Other (Comment))   Wt 171 lb 6.4 oz (77.7 kg)  SpO2 100%   BMI 26.06 kg/m  Vitals:   04/11/19 0841  BP: 130/82  Pulse: 65  Temp: (!) 95.5 F (35.3 C)  TempSrc: Other (Comment)  SpO2: 100%  Weight: 171 lb 6.4 oz (77.7 kg)  Body mass index is 26.06 kg/m.   Physical Exam Vitals reviewed.  Constitutional:      Appearance: He is well-developed.  HENT:     Head: Normocephalic and atraumatic.     Right Ear: External ear normal.     Left Ear: External ear normal.     Nose: Nose normal.  Eyes:     General: No scleral icterus.    Conjunctiva/sclera: Conjunctivae normal.     Comments: Right pupil 1 mm smaller than left.  Neck:     Thyroid: No thyromegaly.  Cardiovascular:     Rate and Rhythm: Normal rate and  regular rhythm.     Heart sounds: Normal heart sounds.  Pulmonary:     Effort: Pulmonary effort is normal.     Breath sounds: Normal breath sounds.  Abdominal:     Palpations: Abdomen is soft.  Lymphadenopathy:     Cervical: No cervical adenopathy.  Skin:    General: Skin is warm and dry.  Neurological:     General: No focal deficit present.     Mental Status: He is alert and oriented to person, place, and time. Mental status is at baseline.  Psychiatric:        Mood and Affect: Mood normal.        Behavior: Behavior normal.        Thought Content: Thought content normal.        Judgment: Judgment normal.      No results found for any visits on 04/11/19.     Assessment & Plan    1. Failure of erection This seems to be more of a venous and arterial problem.  He has had a reasonable response to sildenafil with erections that are adequate to begin with.  At this time refer back to urology. - Ambulatory referral to Urology  2. Persistent atrial fibrillation (HCC) On Xarelto  3. HTN, goal below 140/80 On telmisartan  4. Gastroesophageal reflux disease, unspecified whether esophagitis present On omeprazole  5. Lumbar stenosis with neurogenic claudication      Jeffery Durie, MD  Jeffery Davis

## 2019-04-11 ENCOUNTER — Encounter: Payer: Self-pay | Admitting: Family Medicine

## 2019-04-11 ENCOUNTER — Other Ambulatory Visit: Payer: Self-pay

## 2019-04-11 ENCOUNTER — Ambulatory Visit (INDEPENDENT_AMBULATORY_CARE_PROVIDER_SITE_OTHER): Payer: PPO | Admitting: Family Medicine

## 2019-04-11 VITALS — BP 130/82 | HR 65 | Temp 95.5°F | Wt 171.4 lb

## 2019-04-11 DIAGNOSIS — M48062 Spinal stenosis, lumbar region with neurogenic claudication: Secondary | ICD-10-CM

## 2019-04-11 DIAGNOSIS — I10 Essential (primary) hypertension: Secondary | ICD-10-CM | POA: Diagnosis not present

## 2019-04-11 DIAGNOSIS — I4819 Other persistent atrial fibrillation: Secondary | ICD-10-CM

## 2019-04-11 DIAGNOSIS — K219 Gastro-esophageal reflux disease without esophagitis: Secondary | ICD-10-CM

## 2019-04-11 DIAGNOSIS — N529 Male erectile dysfunction, unspecified: Secondary | ICD-10-CM

## 2019-04-16 DIAGNOSIS — M5417 Radiculopathy, lumbosacral region: Secondary | ICD-10-CM | POA: Diagnosis not present

## 2019-04-16 DIAGNOSIS — I480 Paroxysmal atrial fibrillation: Secondary | ICD-10-CM | POA: Diagnosis not present

## 2019-04-16 DIAGNOSIS — Z6825 Body mass index (BMI) 25.0-25.9, adult: Secondary | ICD-10-CM | POA: Diagnosis not present

## 2019-04-16 DIAGNOSIS — I10 Essential (primary) hypertension: Secondary | ICD-10-CM | POA: Diagnosis not present

## 2019-04-16 DIAGNOSIS — I428 Other cardiomyopathies: Secondary | ICD-10-CM | POA: Diagnosis not present

## 2019-04-24 ENCOUNTER — Other Ambulatory Visit: Payer: Self-pay | Admitting: Family Medicine

## 2019-04-26 ENCOUNTER — Ambulatory Visit (INDEPENDENT_AMBULATORY_CARE_PROVIDER_SITE_OTHER): Payer: PPO | Admitting: *Deleted

## 2019-04-26 DIAGNOSIS — I480 Paroxysmal atrial fibrillation: Secondary | ICD-10-CM | POA: Diagnosis not present

## 2019-04-26 LAB — CUP PACEART REMOTE DEVICE CHECK
Date Time Interrogation Session: 20210212123900
Implantable Pulse Generator Implant Date: 20200217

## 2019-04-27 NOTE — Progress Notes (Signed)
ILR remote 

## 2019-05-09 ENCOUNTER — Telehealth: Payer: Self-pay

## 2019-05-09 ENCOUNTER — Ambulatory Visit: Payer: Self-pay

## 2019-05-09 NOTE — Chronic Care Management (AMB) (Signed)
  Chronic Care Management   Outreach Note  05/09/2019 Name: Jeffery Davis MRN: [id number] DOB: 1946/02/23  Primary Care Provider: Jerrol Banana., MD Reason for referral : Chronic Care Management    An unsuccessful telephone outreach was attempted today. Jeffery Davis is currently engaged with the case management team. He was scheduled for routine outreach today.  A HIPAA compliant voice message was left requesting a return call.   Follow Up Plan: The care management team will reach out to Jeffery Davis again within the next two weeks.   [facility] Care Management [phone]

## 2019-05-15 ENCOUNTER — Encounter: Payer: Self-pay | Admitting: Urology

## 2019-05-15 ENCOUNTER — Ambulatory Visit: Payer: PPO | Admitting: Urology

## 2019-05-15 ENCOUNTER — Other Ambulatory Visit: Payer: Self-pay

## 2019-05-15 VITALS — BP 145/85 | HR 78 | Ht 68.0 in | Wt 162.0 lb

## 2019-05-15 DIAGNOSIS — N5202 Corporo-venous occlusive erectile dysfunction: Secondary | ICD-10-CM | POA: Diagnosis not present

## 2019-05-15 MED ORDER — TADALAFIL 20 MG PO TABS: ORAL_TABLET | ORAL | 0 refills | Status: DC

## 2019-05-15 NOTE — Progress Notes (Signed)
05/15/2019 11:16 AM   Alyson Locket Wallenstein 07-11-1945 [id number]  Referring provider: Jerrol Banana., MD [address] [place],  [place] [address]  Chief Complaint  Patient presents with  . Erectile Dysfunction    HPI: Margarito Bonifay is a 74 YO male seen at the request of Dr. Rosanna Randy for evaluation of erectile dysfunction.  He has mild ED for several years and was using a 20 mg sildenafil tab prior to intercourse which was effective at achieving good erections.  He was also able to obtain sufficient erections with no medication.  Approximately 6-12 months ago he began to have difficulty maintaining erection.  He increased his sildenafil to 40 mg which did not make a difference.  He estimates on average he will lose the erection with an 2 minutes of penetration and prior to orgasm.  No pain or curvature with erections.  He has not been started on any new medications including SSRIs at the time of symptom onset.   PMH: Past Medical History:  Diagnosis Date  . Actinic keratosis   . Allergy   . Arthritis    degenerative  . Atrial fibrillation ([place])    s/p cardioversion 07/18/17  . Benign fibroma of prostate   . Carpal tunnel syndrome, right   . Erectile dysfunction   . GERD (gastroesophageal reflux disease)   . Hyperlipidemia   . Hypertension   . Lumbar herniated disc   . Lumbar stenosis with neurogenic claudication   . LV dysfunction   . Neural foraminal stenosis of lumbosacral spine   . Primary localized osteoarthrosis of left shoulder 03/01/2016  . Wears hearing aid in both ears     Surgical History: Past Surgical History:  Procedure Laterality Date  . ABLATION OF DYSRHYTHMIC FOCUS  10/17/2017  . ATRIAL FIBRILLATION ABLATION N/A 10/17/2017   Procedure: ATRIAL FIBRILLATION ABLATION;  Surgeon: Thompson Grayer, MD;  Location: [facility];  Service: Cardiovascular;  Laterality: N/A;  . CARDIAC CATHETERIZATION  2005   [place] N/A  07/18/2017   Procedure: CARDIOVERSION;  Surgeon: Teodoro Spray, MD;  Location: [facility] ORS;  Service: Cardiovascular;  Laterality: N/A;  . CATARACT EXTRACTION  06/2002  . COLONOSCOPY    . COLONOSCOPY WITH PROPOFOL N/A 05/03/2017   Procedure: COLONOSCOPY WITH PROPOFOL;  Surgeon: Manya Silvas, MD;  Location[facility] ENDOSCOPY;  Service: Endoscopy;  Laterality: N/A;  . ESOPHAGOGASTRODUODENOSCOPY (EGD) WITH PROPOFOL N/A 01/08/2016   Procedure: ESOPHAGOGASTRODUODENOSCOPY (EGD) WITH PROPOFOL;  Surgeon: Manya Silvas, MD;  Location: [facility] ENDOSCOPY;  Service: Endoscopy;  Laterality: N/A;  . ESOPHAGOGASTRODUODENOSCOPY (EGD) WITH PROPOFOL N/A 12/24/2018   Procedure: ESOPHAGOGASTRODUODENOSCOPY (EGD) WITH PROPOFOL;  Surgeon: Tol[place], Benay Pike, MD;  Location: [facility] ENDOSCOPY;  Service: Gastroenterology;  Laterality: N/A;  . HAND SURGERY    . Implantable loop recorder placement  04/30/2018   MDT Reveal LINQ implanted [facility] [id number]) by Dr Rayann Heman for afib management post ablation.  Marland Kitchen KNEE ARTHROSCOPY Right   . LUMBAR LAMINECTOMY/DECOMPRESSION MICRODISCECTOMY N/A 01/09/2017   Procedure: LAMINECTOMY AND FORAMINOTOMY LUMBAR THREE-FOUR LUMBAR FOUR-FIVE  LUMBAR FIVE SACRAL ONE;  Surgeon: Newman Pies, MD;  Location: [place];  Service: Neurosurgery;  Laterality: N/A;  . SHOULDER SURGERY Left 2010  . TOTAL SHOULDER ARTHROPLASTY Left 03/01/2016   Procedure: REVERSE TOTAL SHOULDER ARTHROPLASTY;  Surgeon: Marchia Bond, MD;  Location: [place];  Service: Orthopedics;  Laterality: Left;    Home Medications:  Allergies as of 05/15/2019   No Known Allergies  Medication List       Accurate as of May 15, 2019 11:16 AM. If you have any questions, ask your nurse or doctor.        amLODipine 5 MG tablet Commonly known as: NORVASC Take 1 tablet (5 mg total) by mouth daily.   atorvastatin 10 MG tablet Commonly known as: LIPITOR TAKE 1 TABLET EVERY OTHER DAY   Co Q 10 100 MG Caps Take 100 mg by mouth 2 (two)  times daily.   diphenhydramine-acetaminophen 25-500 MG Tabs tablet Commonly known as: TYLENOL PM Take 2 tablets by mouth at bedtime as needed (sleep).   Glucosamine Chondroitin Triple Tabs Take 1 tablet by mouth 2 (two) times daily.   ketoconazole 2 % cream Commonly known as: NIZORAL Apply 1 application topically every other day.   omeprazole 20 MG capsule Commonly known as: PRILOSEC TAKE 1 CAPSULE EVERY DAY What changed:   how much to take  how to take this  when to take this   rivaroxaban 20 MG Tabs tablet Commonly known as: XARELTO Take 20 mg by mouth daily with supper.   sildenafil 20 MG tablet Commonly known as: REVATIO TAKE ONE TABLET BY MOUTH 3 TIMES DAILY AS NEEDED   telmisartan 20 MG tablet Commonly known as: MICARDIS Take 20 mg by mouth daily.   Vitamin D3 125 MCG (5000 UT) Tabs Take 5,000 Units by mouth daily.       Allergies: No Known Allergies  Family History: Family History  Problem Relation Age of Onset  . Cancer Mother        breast and pancreatic  . CAD Father   . Hypertension Father   . Healthy Daughter   . Healthy Son     Social History:  reports that he has never smoked. He has never used smokeless tobacco. He reports current alcohol use of about 7.0 - 14.0 standard drinks of alcohol per week. He reports that he does not use drugs.   Physical Exam: BP (!) 145/85   Pulse 78   Ht 5\' 8"  (1.727 m)   Wt 162 lb (73.5 kg)   BMI 24.63 kg/m   Constitutional:  Alert and oriented, No acute distress. HEENT: [place] AT, moist mucus membranes.  Trachea midline, no masses. Cardiovascular: No clubbing, cyanosis, or edema. Respiratory: Normal respiratory effort, no increased work of breathing. GI: Abdomen is soft, nontender, nondistended, no abdominal masses GU: Phallus circumcised without lesions, testes descended bilaterally without masses or tenderness.  Spermatic cord/epididymis palpably normal bilaterally. Skin: No rashes, bruises or  suspicious lesions. Neurologic: Grossly intact, no focal deficits, moving all 4 extremities. Psychiatric: Normal mood and affect.   Assessment & Plan:    - Erectile dysfunction Recent difficulty maintaining an erection.  We discussed potential etiologies including venoocclusive disease and psychogenic ED.  He did have increased headache and flushing with the 40 mg dose of sildenafil and recommended a trial of tadalafil at a higher dose to see if this achieves better efficacy.  If not would recommend a venous compression band and he was given information on an adjustable band.  I also discussed obtaining a penile Doppler ultrasound for definitive diagnosis however it would not change treatment.   Abbie Sons, [facility] [address] [place], [place] [address] [phone]

## 2019-05-21 ENCOUNTER — Telehealth: Payer: Self-pay

## 2019-05-21 NOTE — Chronic Care Management (AMB) (Signed)
  Chronic Care Management   Note for outreach on 02/21/19.   Name: DHILAN SZALKOWSKI MRN: [id number] DOB: 1945/09/07   Brief outreach with Mr. Kerner. He reports doing well but busy at the time of the call. Will contact CCM Case Manager when he is available.   Follow up plan: -Pending return call. -Will plan to outreach routinely.   [facility] Care Management [phone]

## 2019-05-22 ENCOUNTER — Ambulatory Visit: Payer: Self-pay

## 2019-05-27 ENCOUNTER — Ambulatory Visit (INDEPENDENT_AMBULATORY_CARE_PROVIDER_SITE_OTHER): Payer: PPO | Admitting: *Deleted

## 2019-05-27 DIAGNOSIS — I480 Paroxysmal atrial fibrillation: Secondary | ICD-10-CM | POA: Diagnosis not present

## 2019-05-27 LAB — CUP PACEART REMOTE DEVICE CHECK
Date Time Interrogation Session: 20210315134650
Implantable Pulse Generator Implant Date: 20200217

## 2019-05-27 NOTE — Progress Notes (Signed)
ILR Remote 

## 2019-06-03 ENCOUNTER — Ambulatory Visit (HOSPITAL_COMMUNITY): Payer: PPO | Admitting: Nurse Practitioner

## 2019-06-05 ENCOUNTER — Other Ambulatory Visit: Payer: Self-pay | Admitting: Family Medicine

## 2019-06-05 DIAGNOSIS — I10 Essential (primary) hypertension: Secondary | ICD-10-CM

## 2019-06-12 DIAGNOSIS — M1732 Unilateral post-traumatic osteoarthritis, left knee: Secondary | ICD-10-CM | POA: Diagnosis not present

## 2019-06-27 ENCOUNTER — Ambulatory Visit (INDEPENDENT_AMBULATORY_CARE_PROVIDER_SITE_OTHER): Payer: PPO | Admitting: *Deleted

## 2019-06-27 DIAGNOSIS — I480 Paroxysmal atrial fibrillation: Secondary | ICD-10-CM

## 2019-06-27 LAB — CUP PACEART REMOTE DEVICE CHECK
Date Time Interrogation Session: 20210415135030
Implantable Pulse Generator Implant Date: 20200217

## 2019-06-28 NOTE — Progress Notes (Signed)
ILR Remote 

## 2019-07-04 ENCOUNTER — Telehealth: Payer: Self-pay

## 2019-07-09 ENCOUNTER — Ambulatory Visit: Payer: Self-pay

## 2019-07-09 DIAGNOSIS — I10 Essential (primary) hypertension: Secondary | ICD-10-CM

## 2019-07-09 NOTE — Chronic Care Management (AMB) (Signed)
  Chronic Care Management   Note   Name: Jeffery Davis MRN: [id number] DOB: [birth year]     A unsuccessful outreach was attempted on 05/21/19. [name] left a voice message acknowledging receipt of the call. No urgent concerns.    The care management team will continue routine outreach.   [facility] Care Management [phone]

## 2019-07-10 ENCOUNTER — Encounter: Payer: Self-pay | Admitting: Family Medicine

## 2019-07-10 ENCOUNTER — Other Ambulatory Visit: Payer: Self-pay

## 2019-07-10 ENCOUNTER — Ambulatory Visit (INDEPENDENT_AMBULATORY_CARE_PROVIDER_SITE_OTHER): Payer: PPO | Admitting: Family Medicine

## 2019-07-10 VITALS — BP 121/78 | HR 61 | Temp 97.1°F | Ht 68.0 in | Wt 164.4 lb

## 2019-07-10 DIAGNOSIS — I4819 Other persistent atrial fibrillation: Secondary | ICD-10-CM | POA: Diagnosis not present

## 2019-07-10 DIAGNOSIS — E7800 Pure hypercholesterolemia, unspecified: Secondary | ICD-10-CM | POA: Diagnosis not present

## 2019-07-10 DIAGNOSIS — I10 Essential (primary) hypertension: Secondary | ICD-10-CM

## 2019-07-10 DIAGNOSIS — M791 Myalgia, unspecified site: Secondary | ICD-10-CM

## 2019-07-10 NOTE — Patient Instructions (Signed)
STOP LIPITOR

## 2019-07-10 NOTE — Progress Notes (Signed)
Established patient visit   Patient: Jeffery Davis   DOB: 1945-03-31   74 y.o. Adult  MRN: [id number] Visit Date: 07/10/2019  Today's healthcare provider: [name], MD   Chief Complaint  Patient presents with  . leg cramps   Subjective    Leg Pain  The incident occurred more than 1 week ago. The pain is present in the left foot, right foot, right leg and right thigh (also hands). The quality of the pain is described as cramping. The pain is at a severity of 8/10. Pertinent negatives include no inability to bear weight, numbness or tingling. He reports no foreign bodies present. The symptoms are aggravated by movement. Treatments tried: magnesium oxide. The treatment provided no relief.  No calf pain with walking.       Medications: Outpatient Medications Prior to Visit  Medication Sig  . amLODipine (NORVASC) 5 MG tablet TAKE ONE TABLET BY MOUTH EVERY DAY  . atorvastatin (LIPITOR) 10 MG tablet TAKE 1 TABLET EVERY OTHER DAY  . Cholecalciferol (VITAMIN D3) 5000 units TABS Take 5,000 Units by mouth daily.  . Coenzyme Q10 (CO Q 10) 100 MG CAPS Take 100 mg by mouth 2 (two) times daily.   . diphenhydramine-acetaminophen (TYLENOL PM) 25-500 MG TABS tablet Take 2 tablets by mouth at bedtime as needed (sleep).  Marland Kitchen ketoconazole (NIZORAL) 2 % cream Apply 1 application topically every other day.  . Misc Natural Products (GLUCOSAMINE CHONDROITIN TRIPLE) TABS Take 1 tablet by mouth 2 (two) times daily.  Marland Kitchen omeprazole (PRILOSEC) 20 MG capsule TAKE 1 CAPSULE EVERY DAY (Patient taking differently: Take 20 mg by mouth every other day. TAKE 1 CAPSULE EVERY DAY)  . sildenafil (REVATIO) 20 MG tablet TAKE ONE TABLET BY MOUTH 3 TIMES DAILY AS NEEDED  . tadalafil (CIALIS) 20 MG tablet 0.5-1 tab 1 hour prior to intercourse  . rivaroxaban (XARELTO) 20 MG TABS tablet Take 20 mg by mouth daily with supper.  . telmisartan (MICARDIS) 20 MG tablet Take 20 mg by mouth daily.    No  facility-administered medications prior to visit.    Review of Systems  Constitutional: Negative.  Negative for activity change, appetite change, chills, diaphoresis, fatigue, fever and unexpected weight change.  HENT: Negative.   Eyes: Negative.   Respiratory: Negative.  Negative for cough and shortness of breath.   Cardiovascular: Negative.  Negative for chest pain, palpitations and leg swelling.  Gastrointestinal: Negative.   Endocrine: Negative.   Genitourinary: Negative.   Musculoskeletal: Positive for myalgias. Negative for arthralgias and back pain.  Skin: Negative.   Allergic/Immunologic: Negative.   Neurological: Negative.  Negative for dizziness, tingling, light-headedness, numbness and headaches.  Hematological: Negative.   Psychiatric/Behavioral: Negative.  Negative for agitation, self-injury, sleep disturbance and suicidal ideas. The patient is not nervous/anxious.        Objective    BP 121/78 (BP Location: Right Arm, Patient Position: Sitting, Cuff Size: Normal)   Pulse 61   Temp (!) 97.1 F (36.2 C) (Temporal)   Ht 5\' 8"  (1.727 m)   Wt 164 lb 6.4 oz (74.6 kg)   SpO2 99%   BMI 25.00 kg/m     Physical Exam Vitals reviewed.  Constitutional:      Appearance: He is well-developed.  HENT:     Head: Normocephalic and atraumatic.     Right Ear: External ear normal.     Left Ear: External ear normal.     Nose: Nose normal.  Eyes:  General: No scleral icterus.    Conjunctiva/sclera: Conjunctivae normal.     Comments: Right pupil 1 mm smaller than left.  Neck:     Thyroid: No thyromegaly.  Cardiovascular:     Rate and Rhythm: Normal rate and regular rhythm.     Heart sounds: Normal heart sounds.  Pulmonary:     Effort: Pulmonary effort is normal.     Breath sounds: Normal breath sounds.  Abdominal:     Palpations: Abdomen is soft.  Lymphadenopathy:     Cervical: No cervical adenopathy.  Skin:    General: Skin is warm and dry.     Comments: Fleshy  growth over lateral nail bed. Neurovascular intact.  Neurological:     General: No focal deficit present.     Mental Status: He is alert and oriented to person, place, and time. Mental status is at baseline.     Comments: Neurovascular exam of extremities is normal  Psychiatric:        Mood and Affect: Mood normal.        Behavior: Behavior normal.        Thought Content: Thought content normal.        Judgment: Judgment normal.       No results found for any visits on 07/10/19.  Assessment & Plan     1. Myalgia Uncertain etiology at this time.  At this time would like to stop his Lipitor and see him back in a couple of weeks.  Continue magnesium twice a day.  Exam completely normal  2. Persistent atrial fibrillation (HCC) On Xarelto  3. HTN, goal below 140/80   4. Hypercholesteremia Stop Lipitor   No follow-ups on file.         Migdalia Olejniczak Cranford Mon, MD  [facility] [phone] (phone) [phone] (fax)  [place]

## 2019-07-15 NOTE — Chronic Care Management (AMB) (Signed)
  Chronic Care Management   Note   Name: Jeffery Davis MRN: [id number] DOB: 22-Feb-1946   Brief outreach with [name]. Expressed concerns regarding increased aches and muscle cramps in his hands and legs over the past few weeks. States he is currently experiencing these episodes at least once or twice a night. Denies recent falls. He is agreeable to being evaluated in the clinic.   Follow up plan: Contacted scheduling staff. Arranged appointment for [name] to be evaluated by Dr. Rosanna Randy at [facility] tomorrow. The care management team will follow-up with him within the next week.   Horris Latino [facility] Care Management [phone]

## 2019-07-18 ENCOUNTER — Telehealth: Payer: Self-pay

## 2019-07-18 ENCOUNTER — Ambulatory Visit: Payer: Self-pay

## 2019-07-23 ENCOUNTER — Telehealth: Payer: Self-pay

## 2019-07-23 ENCOUNTER — Ambulatory Visit: Payer: Self-pay

## 2019-07-23 NOTE — Chronic Care Management (AMB) (Signed)
  Chronic Care Management   Outreach Note  07/23/2019 Name: Jeffery Davis MRN: [id number] DOB: 03-02-1946  Primary Care Provider: Jerrol Banana., MD Reason for referral : Chronic Care Management   An unsuccessful follow-up outreach was attempted today. [name] is currently engaged with the chronic care management team.   Left a HIPAA compliant voice message requesting a return call.   Follow Up Plan: The care management team will follow-up with [name] later this month.   Horris Latino [facility] Care Management [phone]

## 2019-07-23 NOTE — Chronic Care Management (AMB) (Signed)
  Chronic Care Management   Outreach Note   Name: JAVERY CHAUDHRY MRN: [id number] DOB: [birth year]  Primary Care Provider: Jerrol Banana., MD Reason for referral : Chronic Care Management    Mr. Bedrosian is currently engaged with the chronic care management team. A follow-up outreach was attempted today. Left a HIPAA compliant voice message requesting a return call.    Follow Up Plan: The care management team will reach out to Mr. Mobbs again next week.    Horris Latino [facility] Care Management [phone]

## 2019-07-29 ENCOUNTER — Ambulatory Visit (INDEPENDENT_AMBULATORY_CARE_PROVIDER_SITE_OTHER): Payer: PPO | Admitting: *Deleted

## 2019-07-29 DIAGNOSIS — I480 Paroxysmal atrial fibrillation: Secondary | ICD-10-CM | POA: Diagnosis not present

## 2019-07-29 LAB — CUP PACEART REMOTE DEVICE CHECK
Date Time Interrogation Session: 20210516135503
Implantable Pulse Generator Implant Date: 20200217

## 2019-07-29 NOTE — Progress Notes (Signed)
Carelink Summary Report / Loop Recorder 

## 2019-08-02 NOTE — Progress Notes (Signed)
Established patient visit  I,[name],acting as a scribe for [name], MD.,have documented all relevant documentation on the behalf of [name], MD,as directed by  [name], MD while in the presence of [name], MD.   Patient: Jeffery Davis   DOB: 10-05-1945   73 y.o. Adult  MRN: [id number] Visit Date: 08/08/2019  Today's healthcare provider: [name], MD   No chief complaint on file.  Subjective    HPI  Muscle cramps still present but improved.  Patient has a long history of whitecoat hypertension. Hypertension, follow-up  BP Readings from Last 3 Encounters:  07/10/19 121/78  05/15/19 (!) 145/85  04/11/19 130/82   Wt Readings from Last 3 Encounters:  07/10/19 164 lb 6.4 oz (74.6 kg)  05/15/19 162 lb (73.5 kg)  04/11/19 171 lb 6.4 oz (77.7 kg)     He was last seen for hypertension 1 months ago.  BP at that visit was 121/78. Management since that visit includes; no changes. He reports good compliance with treatment. He is not having side effects. none He is exercising. He is adherent to low salt diet.   Outside blood pressures are 120/62.  He does not smoke.  Use of agents associated with hypertension: none.   --------------------------------------------------------------------  Lipid/Cholesterol, follow-up  Last Lipid Panel: Lab Results  Component Value Date   CHOL 163 10/08/2018   LDLCALC 80 10/08/2018   HDL 49 10/08/2018   TRIG 172 (H) 10/08/2018    He was last seen for this 1 months ago.  Management since that visit includes; stopped Lipitor. He reports good compliance with treatment. He is not having side effects. none He is following a Regular diet. Current exercise: walking  Last metabolic panel Lab Results  Component Value Date   GLUCOSE 88 03/11/2019   NA 139 03/11/2019   K 4.4 03/11/2019   BUN 20 03/11/2019   CREATININE 0.90 03/11/2019   GFRNONAA 84 03/11/2019   GFRAA 98  03/11/2019   CALCIUM 9.2 03/11/2019   AST 35 10/08/2018   ALT 25 03/15/2018   The 10-year ASCVD risk score Mikey Bussing DC Jr., et al., 2013) is: 21.6%  --------------------------------------------------------------------   Myalgia From 07/10/2019-Uncertain etiology at this time.  At this time would like to stop his Lipitor and see him back in a couple of weeks.  Continue magnesium twice a day.  Exam completely normal. .      Medications: Outpatient Medications Prior to Visit  Medication Sig  . amLODipine (NORVASC) 5 MG tablet TAKE ONE TABLET BY MOUTH EVERY DAY  . atorvastatin (LIPITOR) 10 MG tablet TAKE 1 TABLET EVERY OTHER DAY  . Cholecalciferol (VITAMIN D3) 5000 units TABS Take 5,000 Units by mouth daily.  . Coenzyme Q10 (CO Q 10) 100 MG CAPS Take 100 mg by mouth 2 (two) times daily.   . diphenhydramine-acetaminophen (TYLENOL PM) 25-500 MG TABS tablet Take 2 tablets by mouth at bedtime as needed (sleep).  Marland Kitchen ketoconazole (NIZORAL) 2 % cream Apply 1 application topically every other day.  . Misc Natural Products (GLUCOSAMINE CHONDROITIN TRIPLE) TABS Take 1 tablet by mouth 2 (two) times daily.  Marland Kitchen omeprazole (PRILOSEC) 20 MG capsule TAKE 1 CAPSULE EVERY DAY (Patient taking differently: Take 20 mg by mouth every other day. TAKE 1 CAPSULE EVERY DAY)  . rivaroxaban (XARELTO) 20 MG TABS tablet Take 20 mg by mouth daily with supper.  . sildenafil (REVATIO) 20 MG tablet TAKE ONE TABLET BY MOUTH  3 TIMES DAILY AS NEEDED  . tadalafil (CIALIS) 20 MG tablet 0.5-1 tab 1 hour prior to intercourse  . telmisartan (MICARDIS) 20 MG tablet Take 20 mg by mouth daily.    No facility-administered medications prior to visit.    Review of Systems  Constitutional: Negative for appetite change, chills and fever.  HENT: Negative.   Eyes: Negative.   Respiratory: Negative for chest tightness, shortness of breath and wheezing.   Cardiovascular: Negative for chest pain and palpitations.  Gastrointestinal:  Negative for abdominal pain, nausea and vomiting.  Musculoskeletal: Positive for myalgias.  Skin: Negative.   Allergic/Immunologic: Negative.   Neurological: Negative.   Hematological: Negative.   Psychiatric/Behavioral: Negative.        Objective    There were no vitals taken for this visit. BP Readings from Last 3 Encounters:  08/08/19 (!) 155/89  07/10/19 121/78  05/15/19 (!) 145/85   Wt Readings from Last 3 Encounters:  08/08/19 166 lb (75.3 kg)  07/10/19 164 lb 6.4 oz (74.6 kg)  05/15/19 162 lb (73.5 kg)      Physical Exam Vitals and nursing note reviewed.  Constitutional:      Appearance: Normal appearance. He is normal weight.  HENT:     Head: Normocephalic and atraumatic.     Right Ear: External ear normal.     Left Ear: External ear normal.  Eyes:     General: No scleral icterus.    Conjunctiva/sclera: Conjunctivae normal.  Cardiovascular:     Rate and Rhythm: Normal rate and regular rhythm.     Pulses: Normal pulses.     Heart sounds: Normal heart sounds.  Pulmonary:     Effort: Pulmonary effort is normal.     Breath sounds: Normal breath sounds.  Musculoskeletal:     Cervical back: Normal range of motion and neck supple.  Skin:    General: Skin is warm and dry.  Neurological:     General: No focal deficit present.     Mental Status: He is alert and oriented to person, place, and time.  Psychiatric:        Mood and Affect: Mood normal.        Behavior: Behavior normal.        Thought Content: Thought content normal.        Judgment: Judgment normal.       No results found for any visits on 08/08/19.  Assessment & Plan     1. Essential hypertension  - CBC w/Diff/Platelet - Comprehensive Metabolic Panel (CMET) - TSH - CK (Creatine Kinase) - Lipid panel - Sed Rate (ESR)  2. Hypercholesteremia Start Crestor 10 mg daily. - CBC w/Diff/Platelet - Comprehensive Metabolic Panel (CMET) - TSH - CK (Creatine Kinase) - Lipid panel - Sed Rate  (ESR)  3. Coronary artery disease involving native heart, angina presence unspecified, unspecified vessel or lesion type All risk factors treated. - CBC w/Diff/Platelet - Comprehensive Metabolic Panel (CMET) - TSH - CK (Creatine Kinase) - Lipid panel - Sed Rate (ESR)  4. Myalgia Clinically improving. - CBC w/Diff/Platelet - Comprehensive Metabolic Panel (CMET) - TSH - CK (Creatine Kinase) - Lipid panel - Sed Rate (ESR)  5. Persistent atrial fibrillation (HCC)  - CBC w/Diff/Platelet - Comprehensive Metabolic Panel (CMET) - TSH - CK (Creatine Kinase) - Lipid panel - Sed Rate (ESR)   No follow-ups on file.      I, [name], MD, have reviewed all documentation for this visit. The documentation on 08/11/19  for the exam, diagnosis, procedures, and orders are all accurate and complete.    Odaliz Mcqueary Cranford Mon, MD  [facility] [phone] (phone) [phone] (fax)  [place]

## 2019-08-06 ENCOUNTER — Ambulatory Visit: Payer: Self-pay

## 2019-08-08 ENCOUNTER — Encounter: Payer: Self-pay | Admitting: Family Medicine

## 2019-08-08 ENCOUNTER — Other Ambulatory Visit: Payer: Self-pay

## 2019-08-08 ENCOUNTER — Ambulatory Visit (INDEPENDENT_AMBULATORY_CARE_PROVIDER_SITE_OTHER): Payer: PPO | Admitting: Family Medicine

## 2019-08-08 VITALS — BP 155/89 | HR 65 | Temp 97.1°F | Resp 16 | Ht 68.0 in | Wt 166.0 lb

## 2019-08-08 DIAGNOSIS — I10 Essential (primary) hypertension: Secondary | ICD-10-CM | POA: Diagnosis not present

## 2019-08-08 DIAGNOSIS — I2510 Atherosclerotic heart disease of native coronary artery without angina pectoris: Secondary | ICD-10-CM

## 2019-08-08 DIAGNOSIS — I4819 Other persistent atrial fibrillation: Secondary | ICD-10-CM

## 2019-08-08 DIAGNOSIS — M791 Myalgia, unspecified site: Secondary | ICD-10-CM | POA: Diagnosis not present

## 2019-08-08 DIAGNOSIS — E7800 Pure hypercholesterolemia, unspecified: Secondary | ICD-10-CM | POA: Diagnosis not present

## 2019-08-09 LAB — CBC WITH DIFFERENTIAL/PLATELET
Basophils Absolute: 0.1 10*3/uL (ref 0.0–0.2)
Basos: 1 %
EOS (ABSOLUTE): 0.1 10*3/uL (ref 0.0–0.4)
Eos: 2 %
Hematocrit: 36.8 % — ABNORMAL LOW (ref 37.5–51.0)
Hemoglobin: 12.5 g/dL — ABNORMAL LOW (ref 13.0–17.7)
Immature Grans (Abs): 0 10*3/uL (ref 0.0–0.1)
Immature Granulocytes: 0 %
Lymphocytes Absolute: 1.6 10*3/uL (ref 0.7–3.1)
Lymphs: 34 %
MCH: 32.6 pg (ref 26.6–33.0)
MCHC: 34 g/dL (ref 31.5–35.7)
MCV: 96 fL (ref 79–97)
Monocytes Absolute: 0.6 10*3/uL (ref 0.1–0.9)
Monocytes: 13 %
Neutrophils Absolute: 2.3 10*3/uL (ref 1.4–7.0)
Neutrophils: 50 %
Platelets: 241 10*3/uL (ref 150–450)
RBC: 3.84 x10E6/uL — ABNORMAL LOW (ref 4.14–5.80)
RDW: 13.2 % (ref 11.6–15.4)
WBC: 4.6 10*3/uL (ref 3.4–10.8)

## 2019-08-09 LAB — COMPREHENSIVE METABOLIC PANEL
ALT: 25 IU/L (ref 0–44)
AST: 33 IU/L (ref 0–40)
Albumin/Globulin Ratio: 2 (ref 1.2–2.2)
Albumin: 4.3 g/dL (ref 3.7–4.7)
Alkaline Phosphatase: 61 IU/L (ref 48–121)
BUN/Creatinine Ratio: 28 — ABNORMAL HIGH (ref 10–24)
BUN: 24 mg/dL (ref 8–27)
Bilirubin Total: 0.4 mg/dL (ref 0.0–1.2)
CO2: 22 mmol/L (ref 20–29)
Calcium: 9.3 mg/dL (ref 8.6–10.2)
Chloride: 103 mmol/L (ref 96–106)
Creatinine, Ser: 0.85 mg/dL (ref 0.76–1.27)
GFR calc Af Amer: 100 mL/min/{1.73_m2} (ref >59)
GFR calc non Af Amer: 86 mL/min/{1.73_m2} (ref >59)
Globulin, Total: 2.1 g/dL (ref 1.5–4.5)
Glucose: 94 mg/dL (ref 65–99)
Potassium: 4.1 mmol/L (ref 3.5–5.2)
Sodium: 139 mmol/L (ref 134–144)
Total Protein: 6.4 g/dL (ref 6.0–8.5)

## 2019-08-09 LAB — TSH: TSH: 2.66 u[IU]/mL (ref 0.450–4.500)

## 2019-08-09 LAB — LIPID PANEL
Chol/HDL Ratio: 4 ratio (ref 0.0–5.0)
Cholesterol, Total: 223 mg/dL — ABNORMAL HIGH (ref 100–199)
HDL: 56 mg/dL (ref >39)
LDL Chol Calc (NIH): 143 mg/dL — ABNORMAL HIGH (ref 0–99)
Triglycerides: 132 mg/dL (ref 0–149)
VLDL Cholesterol Cal: 24 mg/dL (ref 5–40)

## 2019-08-09 LAB — SEDIMENTATION RATE: Sed Rate: 3 mm/hr (ref 0–30)

## 2019-08-09 LAB — CK: Total CK: 189 U/L (ref 41–331)

## 2019-08-13 ENCOUNTER — Telehealth: Payer: Self-pay

## 2019-08-13 DIAGNOSIS — E7800 Pure hypercholesterolemia, unspecified: Secondary | ICD-10-CM

## 2019-08-13 MED ORDER — ROSUVASTATIN CALCIUM 10 MG PO TABS: 10.0000 mg | ORAL_TABLET | Freq: Every day | ORAL | 3 refills | Status: DC

## 2019-08-13 NOTE — Telephone Encounter (Signed)
Patient advised of lab results through mychart, patient also has read the providers comments and medication sent over to pharmacy.

## 2019-08-13 NOTE — Telephone Encounter (Signed)
-----   Message from Jerrol Banana., MD sent at 08/13/2019  8:08 AM EDT ----- Labs stable.  Cholesterol up a little as expected.  Start Crestor as planned.

## 2019-08-14 NOTE — Chronic Care Management (AMB) (Signed)
  Chronic Care Management   Note   Name: Jeffery Davis MRN: [id number] DOB: [birth year]    Brief follow-up outreach with [name] regarding muscle cramping. Reports symptoms have improved but he still experiences occasional cramping. Otherwise reports doing very well.   Follow up plan: The care management team will follow-up within the next two to three months.  Horris Latino [facility] Care Management [phone]

## 2019-09-02 ENCOUNTER — Ambulatory Visit (INDEPENDENT_AMBULATORY_CARE_PROVIDER_SITE_OTHER): Payer: PPO | Admitting: *Deleted

## 2019-09-02 DIAGNOSIS — I480 Paroxysmal atrial fibrillation: Secondary | ICD-10-CM

## 2019-09-02 LAB — CUP PACEART REMOTE DEVICE CHECK
Date Time Interrogation Session: 20210621003513
Implantable Pulse Generator Implant Date: 20200217

## 2019-09-02 NOTE — Progress Notes (Signed)
Carelink Summary Report / Loop Recorder 

## 2019-09-24 DIAGNOSIS — M1712 Unilateral primary osteoarthritis, left knee: Secondary | ICD-10-CM | POA: Diagnosis not present

## 2019-10-04 NOTE — Progress Notes (Signed)
Complete physical exam   Patient: Jeffery Davis   DOB: [birth year]   74 y.o. Adult  MRN: [id number] Visit Date: 10/09/2019  Today's healthcare provider: [name], MD   No chief complaint on file.  Subjective    Jeffery Davis is a 74 y.o. adult who presents today for a complete physical exam.  He reports consuming a general diet. He generally feels well. He reports sleeping well. He does not have additional problems to discuss today.  He has been tired recently but gave blood last week. He has been having muscle cramps in recent months. Patient had AWV with [facility] today at 8:40 am.  He is up to date on health maintenance.  Immunizations are up to date including 2 Covid-19 vaccines. Colonoscopy was in 2019 and revealed diverticula and hemorrhoids, recommendation was follow up in 5 years.  Screenings are done today with [facility].  Past Medical History:  Diagnosis Date  . Actinic keratosis   . Allergy   . Arthritis    degenerative  . Atrial fibrillation ([place])    s/p cardioversion 07/18/17  . Benign fibroma of prostate   . Carpal tunnel syndrome, right   . Erectile dysfunction   . GERD (gastroesophageal reflux disease)   . Hyperlipidemia   . Hypertension   . Lumbar herniated disc   . Lumbar stenosis with neurogenic claudication   . LV dysfunction   . Neural foraminal stenosis of lumbosacral spine   . Primary localized osteoarthrosis of left shoulder 03/01/2016  . Wears hearing aid in both ears    Past Surgical History:  Procedure Laterality Date  . ABLATION OF DYSRHYTHMIC FOCUS  10/17/2017  . ATRIAL FIBRILLATION ABLATION N/A 10/17/2017   Procedure: ATRIAL FIBRILLATION ABLATION;  Surgeon: Thompson Grayer, MD;  Location: [place] [facility];  Service: Cardiovascular;  Laterality: N/A;  . CARDIAC CATHETERIZATION  2005   [place] N/A 07/18/2017   Procedure: CARDIOVERSION;  Surgeon: Teodoro Spray, MD;  Location: [facility];  Service: Cardiovascular;   Laterality: N/A;  . CATARACT EXTRACTION  06/2002  . COLONOSCOPY    . COLONOSCOPY WITH PROPOFOL N/A 05/03/2017   Procedure: COLONOSCOPY WITH PROPOFOL;  Surgeon: Manya Silvas, MD;  Location: [facility] END[facility];  Service: Endoscopy;  Laterality: N/A;  . ESOPHAGOGASTRODUODENOSCOPY (EGD) WITH PROPOFOL N/A 01/08/2016   Procedure: ESOPHAGOGASTRODUODENOSCOPY (EGD) WITH PROPOFOL;  Surgeon: Manya Silvas, MD;  Location: [facility] END[facility]COPY;  Service: Endoscopy;  Laterality: N/A;  . ESOPHAGOGASTRODUODENOSCOPY (EGD) WITH PROPOFOL N/A 12/24/2018   Procedure: ESOPHAGOGASTRODUODENOSCOPY (EGD) WITH PROPOFOL;  Surgeon: [place], Benay Pike, MD;  Location: [facility] ENDOSCOPY;  Service: Gastroenterology;  Laterality: N/A;  . HAND SURGERY    . Implantable loop recorder placement  04/30/2018   MDT Reveal LINQ implanted [facility] [id number]) by Dr Rayann Heman for afib management post ablation.  Marland Kitchen KNEE ARTHROSCOPY Right   . LUMBAR LAMINECTOMY/DECOMPRESSION MICRODISCECTOMY N/A 01/09/2017   Procedure: LAMINECTOMY AND FORAMINOTOMY LUMBAR THREE-FOUR LUMBAR FOUR-FIVE  LUMBAR FIVE SACRAL ONE;  Surgeon: Newman Pies, MD;  Location: [place];  Service: Neurosurgery;  Laterality: N/A;  . SHOULDER SURGERY Left 2010  . TOTAL SHOULDER ARTHROPLASTY Left 03/01/2016   Procedure: REVERSE TOTAL SHOULDER ARTHROPLASTY;  Surgeon: Marchia Bond, MD;  Location: [place];  Service: Orthopedics;  Laterality: Left;   Social History   Socioeconomic History  . Marital status: Married    Spouse name: Coralyn Mark  . Number of children: 2  . Years of education: bachelors  . Highest education  level: Bachelor's degree (e.g., BA, AB, BS)  Occupational History  . Occupation: Retired     Comment: worked at [employer] 12 years.     Employer: [employer]  Tobacco Use  . Smoking status: Never Smoker  . Smokeless tobacco: Never Used  Vaping Use  . Vaping Use: Never used  Substance and Sexual Activity  . Alcohol use: Yes    Alcohol/week: 7.0 -  14.0 standard drinks    Types: 7 - 14 Glasses of wine per week    Comment: 1-2 glasses of wine daily  . Drug use: No  . Sexual activity: Not on file  Other Topics Concern  . Not on file  Social History Narrative  . Not on file   Social Determinants of Health   Financial Resource Strain: Low Risk   . Difficulty of Paying Living Expenses: Not hard at all  Food Insecurity: No Food Insecurity  . Worried About Charity fundraiser in the Last Year: Never true  . Ran Out of Food in the Last Year: Never true  Transportation Needs: No Transportation Needs  . Lack of Transportation (Medical): No  . Lack of Transportation (Non-Medical): No  Physical Activity: Sufficiently Active  . Days of Exercise per Week: 7 days  . Minutes of Exercise per Session: 60 min  Stress: No Stress Concern Present  . Feeling of Stress : Not at all  Social Connections: Socially Integrated  . Frequency of Communication with Friends and Family: More than three times a week  . Frequency of Social Gatherings with Friends and Family: More than three times a week  . Attends Religious Services: More than 4 times per year  . Active Member of Clubs or Organizations: Yes  . Attends Archivist Meetings: More than 4 times per year  . Marital Status: Married  Human resources officer Violence: Not At Risk  . Fear of Current or Ex-Partner: No  . Emotionally Abused: No  . Physically Abused: No  . Sexually Abused: No   Family Status  Relation Name Status  . Mother  Deceased  . Father  Deceased  . Daughter  Alive  . Son  Alive  . MGM  Deceased  . MGF  Deceased  . PGM  Deceased  . PGF  Deceased   Family History  Problem Relation Age of Onset  . Cancer Mother        breast and pancreatic  . CAD Father   . Hypertension Father   . Healthy Daughter   . Healthy Son    No Known Allergies  Patient Care Team: Jerrol Banana., MD as PCP - General (Family Medicine) Idelle Leech, [place] as Consulting Physician  (Optometry) Ralene Bathe, MD as Consulting Physician (Dermatology) Ubaldo Glassing Javier Docker, MD as Consulting Physician (Cardiology) Thompson Grayer, MD as Consulting Physician (Cardiology) Newman Pies, MD as Consulting Physician (Neurosurgery) Marchia Bond, MD as Consulting Physician (Orthopedic Surgery) Neldon Labella, RN as Case Manager [place] Park, MD as Referring Physician (Orthopedic Surgery)   Medications: Outpatient Medications Prior to Visit  Medication Sig  . amLODipine (NORVASC) 5 MG tablet TAKE ONE TABLET BY MOUTH EVERY DAY  . Cholecalciferol (VITAMIN D3) 5000 units TABS Take 5,000 Units by mouth daily.  . Coenzyme Q10 (CO Q 10) 100 MG CAPS Take 100 mg by mouth 2 (two) times daily.   . diphenhydramine-acetaminophen (TYLENOL PM) 25-500 MG TABS tablet Take 2 tablets by mouth at bedtime as needed (  sleep).  Marland Kitchen ketoconazole (NIZORAL) 2 % cream Apply 1 application topically every other day.  . Magnesium 250 MG TABS Take 250 mg by mouth 2 (two) times daily.  . Misc Natural Products (GLUCOSAMINE CHONDROITIN TRIPLE) TABS Take 1 tablet by mouth 2 (two) times daily.  Marland Kitchen omeprazole (PRILOSEC) 20 MG capsule TAKE 1 CAPSULE BY MOUTH ONCE DAILY  . rivaroxaban (XARELTO) 20 MG TABS tablet Take 20 mg by mouth daily with supper.  . rosuvastatin (CRESTOR) 10 MG tablet Take 1 tablet (10 mg total) by mouth daily.  . sildenafil (REVATIO) 20 MG tablet TAKE ONE TABLET BY MOUTH 3 TIMES DAILY AS NEEDED  . tadalafil (CIALIS) 20 MG tablet 0.5-1 tab 1 hour prior to intercourse  . telmisartan (MICARDIS) 20 MG tablet Take 20 mg by mouth daily.   . [DISCONTINUED] atorvastatin (LIPITOR) 10 MG tablet TAKE 1 TABLET EVERY OTHER DAY (Patient not taking: Reported on 10/09/2019)   No facility-administered medications prior to visit.    Review of Systems  Constitutional: Positive for fatigue.  HENT: Negative.   Eyes: Negative.   Respiratory: Negative.   Cardiovascular: Negative.   Gastrointestinal:  Negative.   Endocrine: Negative.   Genitourinary: Negative.   Musculoskeletal: Negative.   Skin: Negative.   Allergic/Immunologic: Negative.   Neurological: Positive for light-headedness.  Hematological: Negative.   Psychiatric/Behavioral: Negative.        Objective    BP (!) 108/58 (BP Location: Right Arm, Patient Position: Sitting, Cuff Size: Normal)   Temp (!) 97.1 F (36.2 C) (Skin)   Ht 5\' 8"  (1.727 m)   Wt 168 lb (76.2 kg)   SpO2 98%   BMI 25.54 kg/m     Physical Exam Constitutional:      Appearance: Normal appearance. He is normal weight.  HENT:     Head: Normocephalic and atraumatic.     Right Ear: Tympanic membrane, ear canal and external ear normal.     Left Ear: Tympanic membrane, ear canal and external ear normal.     Nose: Nose normal.     Mouth/Throat:     Mouth: Mucous membranes are moist.     Pharynx: Oropharynx is clear.  Eyes:     Extraocular Movements: Extraocular movements intact.     Conjunctiva/sclera: Conjunctivae normal.     Pupils: Pupils are equal, round, and reactive to light.  Cardiovascular:     Rate and Rhythm: Normal rate and regular rhythm.     Pulses: Normal pulses.     Heart sounds: Normal heart sounds.  Pulmonary:     Effort: Pulmonary effort is normal.     Breath sounds: Normal breath sounds.  Abdominal:     General: Abdomen is flat. Bowel sounds are normal.     Palpations: Abdomen is soft.  Genitourinary:    Penis: Normal.      Testes: Normal.  Musculoskeletal:        General: Normal range of motion.     Cervical back: Normal range of motion and neck supple.  Skin:    General: Skin is warm and dry.  Neurological:     General: No focal deficit present.     Mental Status: He is alert and oriented to person, place, and time. Mental status is at baseline.  Psychiatric:        Mood and Affect: Mood normal.        Behavior: Behavior normal.        Thought Content: Thought content normal.  Judgment: Judgment normal.       Fall Risk  10/09/2019 10/08/2018 10/08/2018 10/03/2017 09/27/2016  Falls in the past year? 0 0 0 No No  Number falls in past yr: 0 0 - - -  Injury with Fall? 0 0 - - -  Comment - - - - -  Risk for fall due to : - - - - -  Follow up - - - - -     Functional Status Survey:      Clinical Support from 10/09/2019 in [facility]  AUDIT-C Score 4        No results found for any visits on 10/09/19.  Assessment & Plan    Routine Health Maintenance and Physical Exam  Exercise Activities and Dietary recommendations Goals    . Blood Pressure Management     Current Barriers:  . Chronic Disease Management education and support related to hypertension self management.  Case Manager Clinical Goal(s):  Marland Kitchen Over the next 60 days, patient will verbalize understanding of plan for hypertension management. . Over the next 60 days, patient will demonstrate adherence to prescribed treatment plan for hypertension as evidenced by taking all medications as prescribed, monitoring and recording blood pressure as directed, and adhering to a cardiac prudent diet.  Interventions:  . Reviewed medications, prescribed doses and indications for use. Patient encouraged to continue taking medications as prescribed and notify MD if unable to tolerate prescribed regimen. . Advised patient to monitor blood pressure and maintain a BP log to identify trends. Patient reports monitoring his BP every few weeks. Encouraged to monitor at least three times a week if unable to monitor daily. Discussed recommended parameters and indications for notifying his primary care provider. . Encouraged to continue adherence with cardiac prudent diet. . Encouraged to continue mild exercises. Patient reports walking approximately 4-6 miles a day. Reports meeting his weight loss goal of 20lbs. He is doing very well with weight maintenance. . Reviewed schedule and pending appointments. Encouraged to attend appointments as  scheduled to prevent delays in care.   Patient Self Care Activities:  . Self administers medications as prescribed . Attends provider appointments . Calls pharmacy for medication refills . Performs ADL's independently . Performs IADL's independently . Calls provider office for new concerns or questions  Initial goal documentation        . DIET - REDUCE PORTION SIZE     Recommend decreasing portion sizes by half and eating 3 small meals a day with two healthy snacks in between.     . Reduce salt intake to 2 grams per day or less     Reduce salt intake to 2 grams per day or less       Immunization History  Administered Date(s) Administered  . Fluad Quad(high Dose 65+) 12/05/2018  . Influenza, High Dose Seasonal PF 01/08/2015, 12/08/2015, 12/30/2016  . Influenza,inj,Quad PF,6+ Mos 01/07/2014  . Influenza-Unspecified 12/31/2017  . PFIZER SARS-COV-2 Vaccination 04/12/2019, 05/01/2019  . Pneumococcal Conjugate-13 11/10/2014  . Pneumococcal Polysaccharide-23 10/06/2011  . Td 12/22/2003  . Tdap 10/06/2011  . Zoster 03/14/2010    Health Maintenance  Topic Date Due  . INFLUENZA VACCINE  10/13/2019  . COLONOSCOPY  05/03/2020  . TETANUS/TDAP  10/05/2021  . COVID-19 Vaccine  Completed  . Hepatitis C Screening  Completed  . PNA vac Low Risk Adult  Completed    Discussed health benefits of physical activity, and encouraged him to engage in regular exercise appropriate for his age and condition.  1. Annual physical exam   2. HTN, goal below 140/80  - CBC with Differential/Platelet - Comprehensive metabolic panel - TSH  3. Hypercholesteremia  - Lipid Panel With LDL/HDL Ratio - TSH  4. Paroxysmal atrial fibrillation (HCC)   5. Cardiomyopathy, unspecified type ([place])   6. Status post replacement of left shoulder joint   7. Acid indigestion   8. Failure of erection     No follow-ups on file.        Hazely Sealey Cranford Mon, MD  [facility] [phone] (phone) [phone] (fax)  [place]

## 2019-10-05 ENCOUNTER — Other Ambulatory Visit: Payer: Self-pay | Admitting: Family Medicine

## 2019-10-05 DIAGNOSIS — I10 Essential (primary) hypertension: Secondary | ICD-10-CM

## 2019-10-05 NOTE — Telephone Encounter (Signed)
Requested Prescriptions  Pending Prescriptions Disp Refills  . amLODipine (NORVASC) 5 MG tablet [Pharmacy Med Name: AMLODIPINE BESYLATE 5 MG TAB] 90 tablet 1    Sig: TAKE ONE TABLET BY MOUTH EVERY DAY     Cardiovascular:  Calcium Channel Blockers Failed - 10/05/2019  8:09 AM      Failed - Last BP in normal range    BP Readings from Last 1 Encounters:  08/08/19 (!) 155/89         Passed - Valid encounter within last 6 months    Recent Outpatient Visits          1 month ago Essential hypertension   [facility] Jerrol Banana., MD   2 months ago [place] Jerrol Banana., MD   5 months ago Failure of erection   [facility] Jerrol Banana., MD   6 months ago Chest pain, unspecified type   [facility] Jerrol Banana., MD   12 months ago Annual physical exam   [facility] Jerrol Banana., MD      Future Appointments            In 4 days Jerrol Banana., MD [facility], [place]   In 1 month Thompson Grayer, MD [place], [facility]

## 2019-10-07 ENCOUNTER — Ambulatory Visit (INDEPENDENT_AMBULATORY_CARE_PROVIDER_SITE_OTHER): Payer: PPO | Admitting: *Deleted

## 2019-10-07 ENCOUNTER — Other Ambulatory Visit: Payer: Self-pay | Admitting: Family Medicine

## 2019-10-07 DIAGNOSIS — K219 Gastro-esophageal reflux disease without esophagitis: Secondary | ICD-10-CM

## 2019-10-07 DIAGNOSIS — I480 Paroxysmal atrial fibrillation: Secondary | ICD-10-CM

## 2019-10-07 LAB — CUP PACEART REMOTE DEVICE CHECK
Date Time Interrogation Session: 20210725233332
Implantable Pulse Generator Implant Date: 20200217

## 2019-10-08 ENCOUNTER — Other Ambulatory Visit: Payer: Self-pay

## 2019-10-08 ENCOUNTER — Encounter: Payer: Self-pay | Admitting: Family Medicine

## 2019-10-08 NOTE — Progress Notes (Signed)
Carelink Summary Report / Loop Recorder 

## 2019-10-08 NOTE — Progress Notes (Signed)
Subjective:   Jeffery Davis is a 74 y.o. male who presents for Medicare Annual/Subsequent preventive examination.  Review of Systems    N/A  Cardiac Risk Factors include: advanced age (>33men, >31 women);hypertension;male gender;dyslipidemia;sedentary lifestyle     Objective:    Today's Vitals   10/09/19 0843  BP: (!) 108/58  Pulse: 65  Temp: (!) 97.1 F (36.2 C)  TempSrc: Temporal  SpO2: 98%  Weight: 168 lb 9.6 oz (76.5 kg)  Height: 5\' 8"  (1.727 m)  PainSc: 0-No pain   Body mass index is 25.64 kg/m.  Advanced Directives 10/09/2019 10/08/2018 10/17/2017 10/03/2017 08/01/2017 07/18/2017 06/05/2017  Does Patient Have a Medical Advance Directive? Yes Yes Yes Yes Yes No Yes  Type of Paramedic of [place];Living will [place];Living will [place];Living will [place];Living will Living will - [place];Living will  Does patient want to make changes to medical advance directive? - - No - Patient declined - No - Patient declined - -  Copy of [place] in Chart? Yes - validated most recent copy scanned in chart (See row information) Yes - validated most recent copy scanned in chart (See row information) No - copy requested Yes - - Yes  Would patient like information on creating a medical advance directive? - - - - No - Patient declined No - Patient declined -    Current Medications (verified) Outpatient Encounter Medications as of 10/09/2019  Medication Sig  . amLODipine (NORVASC) 5 MG tablet TAKE ONE TABLET BY MOUTH EVERY DAY  . Cholecalciferol (VITAMIN D3) 5000 units TABS Take 5,000 Units by mouth daily.  . Coenzyme Q10 (CO Q 10) 100 MG CAPS Take 100 mg by mouth 2 (two) times daily.   . diphenhydramine-acetaminophen (TYLENOL PM) 25-500 MG TABS tablet Take 2 tablets by mouth at bedtime as needed (sleep).  . Magnesium 250 MG TABS Take 250 mg by mouth 2 (two) times  daily.  . Misc Natural Products (GLUCOSAMINE CHONDROITIN TRIPLE) TABS Take 1 tablet by mouth 2 (two) times daily.  Marland Kitchen omeprazole (PRILOSEC) 20 MG capsule TAKE 1 CAPSULE BY MOUTH ONCE DAILY  . rosuvastatin (CRESTOR) 10 MG tablet Take 1 tablet (10 mg total) by mouth daily.  . sildenafil (REVATIO) 20 MG tablet TAKE ONE TABLET BY MOUTH 3 TIMES DAILY AS NEEDED  . tadalafil (CIALIS) 20 MG tablet 0.5-1 tab 1 hour prior to intercourse  . atorvastatin (LIPITOR) 10 MG tablet TAKE 1 TABLET EVERY OTHER DAY (Patient not taking: Reported on 08/08/2019)  . ketoconazole (NIZORAL) 2 % cream Apply 1 application topically every other day.  . rivaroxaban (XARELTO) 20 MG TABS tablet Take 20 mg by mouth daily with supper.  . telmisartan (MICARDIS) 20 MG tablet Take 20 mg by mouth daily.    No facility-administered encounter medications on file as of 10/09/2019.    Allergies (verified) Patient has no known allergies.   History: Past Medical History:  Diagnosis Date  . Actinic keratosis   . Allergy   . Arthritis    degenerative  . Atrial fibrillation ([place])    s/p cardioversion 07/18/17  . Benign fibroma of prostate   . Carpal tunnel syndrome, right   . Erectile dysfunction   . GERD (gastroesophageal reflux disease)   . Hyperlipidemia   . Hypertension   . Lumbar herniated disc   . Lumbar stenosis with neurogenic claudication   . LV dysfunction   . Neural foraminal  stenosis of lumbosacral spine   . Primary localized osteoarthrosis of left shoulder 03/01/2016  . Wears hearing aid in both ears    Past Surgical History:  Procedure Laterality Date  . ABLATION OF DYSRHYTHMIC FOCUS  10/17/2017  . ATRIAL FIBRILLATION ABLATION N/A 10/17/2017   Procedure: ATRIAL FIBRILLATION ABLATION;  Surgeon: Thompson Grayer, MD;  Location: [facility];  Service: Cardiovascular;  Laterality: N/A;  . CARDIAC CATHETERIZATION  2005   [place] N/A 07/18/2017   Procedure: CARDIOVERSION;  Surgeon: Teodoro Spray, MD;  Location: [facility];  Service: Cardiovascular;  Laterality: N/A;  . CATARACT EXTRACTION  06/2002  . COLONOSCOPY    . COLONOSCOPY WITH PROPOFOL N/A 05/03/2017   Procedure: COLONOSCOPY WITH PROPOFOL;  Surgeon: Manya Silvas, MD;  Location: [facility] ENDOSCOPY;  Service: Endoscopy;  Laterality: N/A;  . ESOPHAGOGASTRODUODENOSCOPY (EGD) WITH PROPOFOL N/A 01/08/2016   Procedure: ESOPHAGOGASTRODUODENOSCOPY (EGD) WITH PROPOFOL;  Surgeon: Manya Silvas, MD;  Location: [facility] ENDOSCOPY;  Service: Endoscopy;  Laterality: N/A;  . ESOPHAGOGASTRODUODENOSCOPY (EGD) WITH PROPOFOL N/A 12/24/2018   Procedure: ESOPHAGOGASTRODUODENOSCOPY (EGD) WITH PROPOFOL;  Surgeon: [place], Benay Pike, MD;  Location: [facility] ENDOSCOPY;  Service: Gastroenterology;  Laterality: N/A;  . HAND SURGERY    . Implantable loop recorder placement  04/30/2018   MDT Reveal LINQ implanted [facility] ([facility]) [id number]) by Dr Rayann Heman for afib management post ablation.  Marland Kitchen KNEE ARTHROSCOPY Right   . LUMBAR LAMINECTOMY/DECOMPRESSION MICRODISCECTOMY N/A 01/09/2017   Procedure: LAMINECTOMY AND FORAMINOTOMY LUMBAR THREE-FOUR LUMBAR FOUR-FIVE  LUMBAR FIVE SACRAL ONE;  Surgeon: Newman Pies, MD;  Location: [facility];  Service: Neurosurgery;  Laterality: N/A;  . SHOULDER SURGERY Left 2010  . TOTAL SHOULDER ARTHROPLASTY Left 03/01/2016   Procedure: REVERSE TOTAL SHOULDER ARTHROPLASTY;  Surgeon: Marchia Bond, MD;  Location: [place];  Service: Orthopedics;  Laterality: Left;   Family History  Problem Relation Age of Onset  . Cancer Mother        breast and pancreatic  . CAD Father   . Hypertension Father   . Healthy Daughter   . Healthy Son    Social History   Socioeconomic History  . Marital status: Married    Spouse name: Jeffery Davis  . Number of children: 2  . Years of education: bachelors  . Highest education level: Bachelor's degree (e.g., BA, AB, BS)  Occupational History  . Occupation: Retired     Comment: worked at [employer] 12  years.     Employer: [employer]  Tobacco Use  . Smoking status: Never Smoker  . Smokeless tobacco: Never Used  Vaping Use  . Vaping Use: Never used  Substance and Sexual Activity  . Alcohol use: Yes    Alcohol/week: 7.0 - 14.0 standard drinks    Types: 7 - 14 Glasses of wine per week    Comment: 1-2 glasses of wine daily  . Drug use: No  . Sexual activity: Not on file  Other Topics Concern  . Not on file  Social History Narrative  . Not on file   Social Determinants of Health   Financial Resource Strain: Low Risk   . Difficulty of Paying Living Expenses: Not hard at all  Food Insecurity: No Food Insecurity  . Worried About Charity fundraiser in the Last Year: Never true  . Ran Out of Food in the Last Year: Never true  Transportation Needs: No Transportation Needs  . Lack of Transportation (Medical): No  . Lack of  Transportation (Non-Medical): No  Physical Activity: Sufficiently Active  . Days of Exercise per Week: 7 days  . Minutes of Exercise per Session: 60 min  Stress: No Stress Concern Present  . Feeling of Stress : Not at all  Social Connections: Socially Integrated  . Frequency of Communication with Friends and Family: More than three times a week  . Frequency of Social Gatherings with Friends and Family: More than three times a week  . Attends Religious Services: More than 4 times per year  . Active Member of Clubs or Organizations: Yes  . Attends Archivist Meetings: More than 4 times per year  . Marital Status: Married    Tobacco Counseling Counseling given: Not Answered   Clinical Intake:  Pre-visit preparation completed: Yes  Pain : No/denies pain Pain Score: 0-No pain     Nutritional Status: BMI 25 -29 Overweight Nutritional Risks: None Diabetes: No  How often do you need to have someone help you when you read instructions, pamphlets, or other written materials from your doctor or pharmacy?: 1 - Never  Diabetic?  No  Interpreter Needed?: No  Information entered by :: [facility], LPN   Activities of Daily Living In your present state of health, do you have any difficulty performing the following activities: 10/09/2019  Hearing? Y  Comment Wears bilateral hearing aids.  Vision? N  Difficulty concentrating or making decisions? N  Walking or climbing stairs? N  Dressing or bathing? N  Doing errands, shopping? N  Preparing Food and eating ? N  Using the Toilet? N  In the past six months, have you accidently leaked urine? N  Do you have problems with loss of bowel control? N  Managing your Medications? N  Managing your Finances? N  Housekeeping or managing your Housekeeping? N  Some recent data might be hidden    Patient Care Team: Jerrol Banana., MD as PCP - General (Family Medicine) Idelle Leech, [place] as Consulting Physician (Optometry) Ralene Bathe, MD as Consulting Physician (Dermatology) Ubaldo Glassing Javier Docker, MD as Consulting Physician (Cardiology) Thompson Grayer, MD as Consulting Physician (Cardiology) Newman Pies, MD as Consulting Physician (Neurosurgery) Marchia Bond, MD as Consulting Physician (Orthopedic Surgery) Neldon Labella, RN as Case Manager [place], MD as Referring Physician (Orthopedic Surgery)  Indicate any recent Medical Services you may have received from other than [facility] providers in the past year (date may be approximate).     Assessment:   This is a routine wellness examination for Jeffery Davis.  Hearing/Vision screen No exam data present  Dietary issues and exercise activities discussed: Current Exercise Habits: Home exercise routine, Type of exercise: walking;Other - see comments (rides a bicycle), Time (Minutes): > 60, Frequency (Times/Week): 7, Weekly Exercise (Minutes/Week): 0, Intensity: Moderate, Exercise limited by: None identified  Goals    . Blood Pressure Management     Current Barriers:  . Chronic Disease Management education and  support related to hypertension self management.  Case Manager Clinical Goal(s):  Marland Kitchen Over the next 60 days, patient will verbalize understanding of plan for hypertension management. . Over the next 60 days, patient will demonstrate adherence to prescribed treatment plan for hypertension as evidenced by taking all medications as prescribed, monitoring and recording blood pressure as directed, and adhering to a cardiac prudent diet.  Interventions:  . Reviewed medications, prescribed doses and indications for use. Patient encouraged to continue taking medications as prescribed and notify MD if unable to tolerate prescribed regimen. . Advised patient to  monitor blood pressure and maintain a BP log to identify trends. Patient reports monitoring his BP every few weeks. Encouraged to monitor at least three times a week if unable to monitor daily. Discussed recommended parameters and indications for notifying his primary care provider. . Encouraged to continue adherence with cardiac prudent diet. . Encouraged to continue mild exercises. Patient reports walking approximately 4-6 miles a day. Reports meeting his weight loss goal of 20lbs. He is doing very well with weight maintenance. . Reviewed schedule and pending appointments. Encouraged to attend appointments as scheduled to prevent delays in care.   Patient Self Care Activities:  . Self administers medications as prescribed . Attends provider appointments . Calls pharmacy for medication refills . Performs ADL's independently . Performs IADL's independently . Calls provider office for new concerns or questions  Initial goal documentation        . DIET - REDUCE PORTION SIZE     Recommend decreasing portion sizes by half and eating 3 small meals a day with two healthy snacks in between.     . Reduce salt intake to 2 grams per day or less     Reduce salt intake to 2 grams per day or less      Depression Screen PHQ 2/9 Scores 10/09/2019  10/08/2018 10/08/2018 10/03/2017 10/03/2017 09/27/2016 09/27/2016  PHQ - 2 Score 0 0 0 0 0 0 0  PHQ- 9 Score - 0 - 1 - 0 -    Fall Risk Fall Risk  10/09/2019 10/08/2018 10/08/2018 10/03/2017 09/27/2016  Falls in the past year? 0 0 0 No No  Number falls in past yr: 0 0 - - -  Injury with Fall? 0 0 - - -  Comment - - - - -  Risk for fall due to : - - - - -  Follow up - - - - -    Any stairs in or around the home? No  If so, are there any without handrails? No  Home free of loose throw rugs in walkways, pet beds, electrical cords, etc? Yes  Adequate lighting in your home to reduce risk of falls? Yes   ASSISTIVE DEVICES UTILIZED TO PREVENT FALLS:  Life alert? No  Use of a cane, walker or w/c? No  Grab bars in the bathroom? No  Shower chair or bench in shower? Yes  Elevated toilet seat or a handicapped toilet? Yes   TIMED UP AND GO:  Was the test performed? Yes .  Length of time to ambulate 10 feet: 7 sec.   Gait steady and fast without use of assistive device  Cognitive Function:     6CIT Screen 10/09/2019 09/27/2016  What Year? 0 points 0 points  What month? 0 points 0 points  What time? 0 points 0 points  Count back from 20 0 points 0 points  Months in reverse 0 points 0 points  Repeat phrase 0 points 2 points  Total Score 0 2    Immunizations Immunization History  Administered Date(s) Administered  . Fluad Quad(high Dose 65+) 12/05/2018  . Influenza, High Dose Seasonal PF 01/08/2015, 12/08/2015, 12/30/2016  . Influenza,inj,Quad PF,6+ Mos 01/07/2014  . Influenza-Unspecified 12/31/2017  . PFIZER SARS-COV-2 Vaccination 04/12/2019, 05/01/2019  . Pneumococcal Conjugate-13 11/10/2014  . Pneumococcal Polysaccharide-23 10/06/2011  . Td 12/22/2003  . Tdap 10/06/2011  . Zoster 03/14/2010    TDAP status: Up to date Flu Vaccine status: Up to date Pneumococcal vaccine status: Up to date Covid-19 vaccine status: Completed vaccines  Qualifies for Shingles Vaccine? Yes     Zostavax completed Yes   Shingrix Completed?: No.    Education has been provided regarding the importance of this vaccine. Patient has been advised to call insurance company to determine out of pocket expense if they have not yet received this vaccine. Advised may also receive vaccine at local pharmacy or Health Dept. Verbalized acceptance and understanding.  Screening Tests Health Maintenance  Topic Date Due  . INFLUENZA VACCINE  10/13/2019  . COLONOSCOPY  05/03/2020  . TETANUS/TDAP  10/05/2021  . COVID-19 Vaccine  Completed  . Hepatitis C Screening  Completed  . PNA vac Low Risk Adult  Completed    Health Maintenance  There are no preventive care reminders to display for this patient.  Colorectal cancer screening: Completed 05/03/17. Repeat every 3 years  Lung Cancer Screening: (Low Dose CT Chest recommended if Age 89-80 years, 30 pack-year currently smoking OR have quit w/in 15years.) does not qualify.   Additional Screening:  Hepatitis C Screening: Up to date  Vision Screening: Recommended annual ophthalmology exams for early detection of glaucoma and other disorders of the eye. Is the patient up to date with their annual eye exam?  Yes  Who is the provider or what is the name of the office in which the patient attends annual eye exams? Dr Matilde Sprang If pt is not established with a provider, would they like to be referred to a provider to establish care? No .   Dental Screening: Recommended annual dental exams for proper oral hygiene  Community Resource Referral / Chronic Care Management: CRR required this visit?  No   CCM required this visit?  No      Plan:     I have personally reviewed and noted the following in the patient's chart:   . Medical and social history . Use of alcohol, tobacco or illicit drugs  . Current medications and supplements . Functional ability and status . Nutritional status . Physical activity . Advanced directives . List of other  physicians . Hospitalizations, surgeries, and ER visits in previous 12 months . Vitals . Screenings to include cognitive, depression, and falls . Referrals and appointments  In addition, I have reviewed and discussed with patient certain preventive protocols, quality metrics, and best practice recommendations. A written personalized care plan for preventive services as well as general preventive health recommendations were provided to patient.     Deante Blough [place], [place]   06/22/3011   Nurse Notes: None.

## 2019-10-09 ENCOUNTER — Ambulatory Visit (INDEPENDENT_AMBULATORY_CARE_PROVIDER_SITE_OTHER): Payer: PPO

## 2019-10-09 ENCOUNTER — Ambulatory Visit (INDEPENDENT_AMBULATORY_CARE_PROVIDER_SITE_OTHER): Payer: PPO | Admitting: Family Medicine

## 2019-10-09 ENCOUNTER — Other Ambulatory Visit: Payer: Self-pay

## 2019-10-09 VITALS — BP 108/58 | Temp 97.1°F | Ht 68.0 in | Wt 168.0 lb

## 2019-10-09 DIAGNOSIS — K30 Functional dyspepsia: Secondary | ICD-10-CM

## 2019-10-09 DIAGNOSIS — Z0000 Encounter for general adult medical examination without abnormal findings: Secondary | ICD-10-CM | POA: Diagnosis not present

## 2019-10-09 DIAGNOSIS — N529 Male erectile dysfunction, unspecified: Secondary | ICD-10-CM

## 2019-10-09 DIAGNOSIS — E7800 Pure hypercholesterolemia, unspecified: Secondary | ICD-10-CM

## 2019-10-09 DIAGNOSIS — I480 Paroxysmal atrial fibrillation: Secondary | ICD-10-CM | POA: Diagnosis not present

## 2019-10-09 DIAGNOSIS — I10 Essential (primary) hypertension: Secondary | ICD-10-CM | POA: Diagnosis not present

## 2019-10-09 DIAGNOSIS — I429 Cardiomyopathy, unspecified: Secondary | ICD-10-CM

## 2019-10-09 DIAGNOSIS — Z96612 Presence of left artificial shoulder joint: Secondary | ICD-10-CM | POA: Diagnosis not present

## 2019-10-09 NOTE — Patient Instructions (Signed)
Jeffery Davis , Thank you for taking time to come for your Medicare Wellness Visit. I appreciate your ongoing commitment to your health goals. Please review the following plan we discussed and let me know if I can assist you in the future.   Screening recommendations/referrals: Colonoscopy: Up to date, due 05/03/2020 Recommended yearly ophthalmology/optometry visit for glaucoma screening and checkup Recommended yearly dental visit for hygiene and checkup  Vaccinations: Influenza vaccine: Done 12/05/18 Pneumococcal vaccine: Completed series Tdap vaccine: Up to date, due 09/2021 Shingles vaccine: Shingrix discussed. Please contact your pharmacy for coverage information.     Advanced directives: Currently on file.  Conditions/risks identified: Recommend to continue to monitor portion sizes and cut back on salt intake.   Next appointment: 9:40 AM today with Dr Rosanna Randy   Preventive Care 27 Years and Older, Male Preventive care refers to lifestyle choices and visits with your health care provider that can promote health and wellness. What does preventive care include?  A yearly physical exam. This is also called an annual well check.  Dental exams once or twice a year.  Routine eye exams. Ask your health care provider how often you should have your eyes checked.  Personal lifestyle choices, including:  Daily care of your teeth and gums.  Regular physical activity.  Eating a healthy diet.  Avoiding tobacco and drug use.  Limiting alcohol use.  Practicing safe sex.  Taking low doses of aspirin every day.  Taking vitamin and mineral supplements as recommended by your health care provider. What happens during an annual well check? The services and screenings done by your health care provider during your annual well check will depend on your age, overall health, lifestyle risk factors, and family history of disease. Counseling  Your health care provider may ask you questions about  your:  Alcohol use.  Tobacco use.  Drug use.  Emotional well-being.  Home and relationship well-being.  Sexual activity.  Eating habits.  History of falls.  Memory and ability to understand (cognition).  Work and work Statistician. Screening  You may have the following tests or measurements:  Height, weight, and BMI.  Blood pressure.  Lipid and cholesterol levels. These may be checked every 5 years, or more frequently if you are over 63 years old.  Skin check.  Lung cancer screening. You may have this screening every year starting at age 26 if you have a 30-pack-year history of smoking and currently smoke or have quit within the past 15 years.  Fecal occult blood test (FOBT) of the stool. You may have this test every year starting at age 63.  Flexible sigmoidoscopy or colonoscopy. You may have a sigmoidoscopy every 5 years or a colonoscopy every 10 years starting at age 68.  Prostate cancer screening. Recommendations will vary depending on your family history and other risks.  Hepatitis C blood test.  Hepatitis B blood test.  Sexually transmitted disease (STD) testing.  Diabetes screening. This is done by checking your blood sugar (glucose) after you have not eaten for a while (fasting). You may have this done every 1-3 years.  Abdominal aortic aneurysm (AAA) screening. You may need this if you are a current or former smoker.  Osteoporosis. You may be screened starting at age 57 if you are at high risk. Talk with your health care provider about your test results, treatment options, and if necessary, the need for more tests. Vaccines  Your health care provider may recommend certain vaccines, such as:  Influenza vaccine. This  is recommended every year.  Tetanus, diphtheria, and acellular pertussis (Tdap, Td) vaccine. You may need a Td booster every 10 years.  Zoster vaccine. You may need this after age 96.  Pneumococcal 13-valent conjugate (PCV13) vaccine.  One dose is recommended after age 47.  Pneumococcal polysaccharide (PPSV23) vaccine. One dose is recommended after age 4. Talk to your health care provider about which screenings and vaccines you need and how often you need them. This information is not intended to replace advice given to you by your health care provider. Make sure you discuss any questions you have with your health care provider. Document Released: 03/27/2015 Document Revised: 11/18/2015 Document Reviewed: 12/30/2014 Elsevier Interactive Patient Education  2017 [place] Prevention in the Home Falls can cause injuries. They can happen to people of all ages. There are many things you can do to make your home safe and to help prevent falls. What can I do on the outside of my home?  Regularly fix the edges of walkways and driveways and fix any cracks.  Remove anything that might make you trip as you walk through a door, such as a raised step or threshold.  Trim any bushes or trees on the path to your home.  Use bright outdoor lighting.  Clear any walking paths of anything that might make someone trip, such as rocks or tools.  Regularly check to see if handrails are loose or broken. Make sure that both sides of any steps have handrails.  Any raised decks and porches should have guardrails on the edges.  Have any leaves, snow, or ice cleared regularly.  Use sand or salt on walking paths during winter.  Clean up any spills in your garage right away. This includes oil or grease spills. What can I do in the bathroom?  Use night lights.  Install grab bars by the toilet and in the tub and shower. Do not use towel bars as grab bars.  Use non-skid mats or decals in the tub or shower.  If you need to sit down in the shower, use a plastic, non-slip stool.  Keep the floor dry. Clean up any water that spills on the floor as soon as it happens.  Remove soap buildup in the tub or shower regularly.  Attach bath  mats securely with double-sided non-slip rug tape.  Do not have throw rugs and other things on the floor that can make you trip. What can I do in the bedroom?  Use night lights.  Make sure that you have a light by your bed that is easy to reach.  Do not use any sheets or blankets that are too big for your bed. They should not hang down onto the floor.  Have a firm chair that has side arms. You can use this for support while you get dressed.  Do not have throw rugs and other things on the floor that can make you trip. What can I do in the kitchen?  Clean up any spills right away.  Avoid walking on wet floors.  Keep items that you use a lot in easy-to-reach places.  If you need to reach something above you, use a strong step stool that has a grab bar.  Keep electrical cords out of the way.  Do not use floor polish or wax that makes floors slippery. If you must use wax, use non-skid floor wax.  Do not have throw rugs and other things on the floor that can make you  trip. What can I do with my stairs?  Do not leave any items on the stairs.  Make sure that there are handrails on both sides of the stairs and use them. Fix handrails that are broken or loose. Make sure that handrails are as long as the stairways.  Check any carpeting to make sure that it is firmly attached to the stairs. Fix any carpet that is loose or worn.  Avoid having throw rugs at the top or bottom of the stairs. If you do have throw rugs, attach them to the floor with carpet tape.  Make sure that you have a light switch at the top of the stairs and the bottom of the stairs. If you do not have them, ask someone to add them for you. What else can I do to help prevent falls?  Wear shoes that:  Do not have high heels.  Have rubber bottoms.  Are comfortable and fit you well.  Are closed at the toe. Do not wear sandals.  If you use a stepladder:  Make sure that it is fully opened. Do not climb a closed  stepladder.  Make sure that both sides of the stepladder are locked into place.  Ask someone to hold it for you, if possible.  Clearly mark and make sure that you can see:  Any grab bars or handrails.  First and last steps.  Where the edge of each step is.  Use tools that help you move around (mobility aids) if they are needed. These include:  Canes.  Walkers.  Scooters.  Crutches.  Turn on the lights when you go into a dark area. Replace any light bulbs as soon as they burn out.  Set up your furniture so you have a clear path. Avoid moving your furniture around.  If any of your floors are uneven, fix them.  If there are any pets around you, be aware of where they are.  Review your medicines with your doctor. Some medicines can make you feel dizzy. This can increase your chance of falling. Ask your doctor what other things that you can do to help prevent falls. This information is not intended to replace advice given to you by your health care provider. Make sure you discuss any questions you have with your health care provider. Document Released: 12/25/2008 Document Revised: 08/06/2015 Document Reviewed: 04/04/2014 Elsevier Interactive Patient Education  2017 [employer].

## 2019-10-10 LAB — CBC WITH DIFFERENTIAL/PLATELET
Basophils Absolute: 0 10*3/uL (ref 0.0–0.2)
Basos: 1 %
EOS (ABSOLUTE): 0.2 10*3/uL (ref 0.0–0.4)
Eos: 3 %
Hematocrit: 33.3 % — ABNORMAL LOW (ref 37.5–51.0)
Hemoglobin: 11.3 g/dL — ABNORMAL LOW (ref 13.0–17.7)
Immature Grans (Abs): 0 10*3/uL (ref 0.0–0.1)
Immature Granulocytes: 0 %
Lymphocytes Absolute: 1.3 10*3/uL (ref 0.7–3.1)
Lymphs: 23 %
MCH: 31.7 pg (ref 26.6–33.0)
MCHC: 33.9 g/dL (ref 31.5–35.7)
MCV: 94 fL (ref 79–97)
Monocytes Absolute: 0.5 10*3/uL (ref 0.1–0.9)
Monocytes: 9 %
Neutrophils Absolute: 3.8 10*3/uL (ref 1.4–7.0)
Neutrophils: 64 %
Platelets: 253 10*3/uL (ref 150–450)
RBC: 3.56 x10E6/uL — ABNORMAL LOW (ref 4.14–5.80)
RDW: 13.6 % (ref 11.6–15.4)
WBC: 5.8 10*3/uL (ref 3.4–10.8)

## 2019-10-10 LAB — COMPREHENSIVE METABOLIC PANEL
ALT: 19 IU/L (ref 0–44)
AST: 21 IU/L (ref 0–40)
Albumin/Globulin Ratio: 1.6 (ref 1.2–2.2)
Albumin: 4.1 g/dL (ref 3.7–4.7)
Alkaline Phosphatase: 62 IU/L (ref 48–121)
BUN/Creatinine Ratio: 30 — ABNORMAL HIGH (ref 10–24)
BUN: 25 mg/dL (ref 8–27)
Bilirubin Total: 0.5 mg/dL (ref 0.0–1.2)
CO2: 22 mmol/L (ref 20–29)
Calcium: 8.9 mg/dL (ref 8.6–10.2)
Chloride: 103 mmol/L (ref 96–106)
Creatinine, Ser: 0.82 mg/dL (ref 0.76–1.27)
GFR calc Af Amer: 101 mL/min/{1.73_m2} (ref >59)
GFR calc non Af Amer: 87 mL/min/{1.73_m2} (ref >59)
Globulin, Total: 2.5 g/dL (ref 1.5–4.5)
Glucose: 89 mg/dL (ref 65–99)
Potassium: 4.1 mmol/L (ref 3.5–5.2)
Sodium: 139 mmol/L (ref 134–144)
Total Protein: 6.6 g/dL (ref 6.0–8.5)

## 2019-10-10 LAB — LIPID PANEL WITH LDL/HDL RATIO
Cholesterol, Total: 159 mg/dL (ref 100–199)
HDL: 60 mg/dL (ref >39)
LDL Chol Calc (NIH): 80 mg/dL (ref 0–99)
LDL/HDL Ratio: 1.3 ratio (ref 0.0–3.6)
Triglycerides: 107 mg/dL (ref 0–149)
VLDL Cholesterol Cal: 19 mg/dL (ref 5–40)

## 2019-10-10 LAB — TSH: TSH: 1.53 u[IU]/mL (ref 0.450–4.500)

## 2019-10-15 ENCOUNTER — Ambulatory Visit: Payer: Self-pay

## 2019-10-15 ENCOUNTER — Telehealth: Payer: Self-pay

## 2019-10-15 DIAGNOSIS — I10 Essential (primary) hypertension: Secondary | ICD-10-CM

## 2019-10-15 DIAGNOSIS — E7800 Pure hypercholesterolemia, unspecified: Secondary | ICD-10-CM

## 2019-10-15 NOTE — Telephone Encounter (Signed)
  Chronic Care Management   Outreach Note  10/15/2019 Name: JERMEY CLOSS MRN: [id number] DOB: [birth year]    Primary Care Provider: Jerrol Banana., MD Reason for referral : Chronic Care Management   An unsuccessful telephone outreach was attempted today. Mr. Norden is currently engaged with the Chronic Care Management team.   A HIPAA compliant voice message was left today requesting a return call.   Follow Up Plan: The care management team will reach out to Mr. Vana again within the next two weeks.   Horris Latino [facility] Care Management [phone]

## 2019-10-15 NOTE — Chronic Care Management (AMB) (Signed)
Chronic Care Management   Follow Up Note   10/15/2019 Name: Jeffery Davis MRN: [id number] DOB: 1945-06-07  Primary Care Provider: Jerrol Davis., MD Reason for referral : Chronic Care Management    Jeffery Davis is a 74 y.o. year old adult who is a primary care patient of Jeffery Davis., MD. A brief telephonic outreach was conducted today. He has met his chronic care management goals.  Review of Jeffery Davis status, including review of consultants reports, relevant labs and test results was conducted today. Collaboration with appropriate care team members was performed as part of the comprehensive evaluation and provision of chronic care management services.    SDOH (Social Determinants of Health) assessments performed: No See Care Plan activities for detailed interventions related to [facility])     Outpatient Encounter Medications as of 10/15/2019  Medication Sig Note  . amLODipine (NORVASC) 5 MG tablet TAKE ONE TABLET BY MOUTH EVERY DAY   . Cholecalciferol (VITAMIN D3) 5000 units TABS Take 5,000 Units by mouth daily.   . Coenzyme Q10 (CO Q 10) 100 MG CAPS Take 100 mg by mouth 2 (two) times daily.    . diphenhydramine-acetaminophen (TYLENOL PM) 25-500 MG TABS tablet Take 2 tablets by mouth at bedtime as needed (sleep).   Marland Kitchen ketoconazole (NIZORAL) 2 % cream Apply 1 application topically every other day.   . Magnesium 250 MG TABS Take 250 mg by mouth 2 (two) times daily. 10/09/2019: For leg cramps  . Misc Natural Products (GLUCOSAMINE CHONDROITIN TRIPLE) TABS Take 1 tablet by mouth 2 (two) times daily.   Marland Kitchen omeprazole (PRILOSEC) 20 MG capsule TAKE 1 CAPSULE BY MOUTH ONCE DAILY   . rivaroxaban (XARELTO) 20 MG TABS tablet Take 20 mg by mouth daily with supper.   . rosuvastatin (CRESTOR) 10 MG tablet Take 1 tablet (10 mg total) by mouth daily.   . sildenafil (REVATIO) 20 MG tablet TAKE ONE TABLET BY MOUTH 3 TIMES DAILY AS NEEDED   . tadalafil (CIALIS) 20 MG tablet 0.5-1 tab 1 hour  prior to intercourse   . telmisartan (MICARDIS) 20 MG tablet Take 20 mg by mouth daily.     No facility-administered encounter medications on file as of 10/15/2019.     Goals Addressed            This Visit's Progress   . COMPLETED: Blood Pressure Management       Current Barriers:  . Chronic Disease Management education and support related to hypertension self management.  Case Manager Clinical Goal(s):  Marland Kitchen Over the next 60 days, patient will verbalize understanding of plan for hypertension management. . Over the next 60 days, patient will demonstrate adherence to prescribed treatment plan for hypertension as evidenced by taking all medications as prescribed, monitoring and recording blood pressure as directed, and adhering to a cardiac prudent diet.  Interventions:  . Reviewed care management goals. Jeffery Davis is doing very well. He remains active and compliant with medications and provider recommendations. His care management goals have been met. The care management team will gladly outreach in the future if his health status changes and he requires assistance.   Patient Self Care Activities:  . Self administers medications as prescribed . Attends provider appointments . Calls pharmacy for medication refills . Performs ADL's independently . Performs IADL's independently . Calls provider office for new concerns or questions  Please see past updates related to this goal by clicking on the "Past Updates" button in the selected goal  PLAN Jeffery Davis has met his chronic care management goals. The care management team will gladly outreach if his health status changes and he requires  assistance.    Jeffery Davis [facility] Care Management [phone]

## 2019-10-16 NOTE — Patient Instructions (Addendum)
Thank you for allowing the Chronic Care Management team to participate in your care.  Goals Addressed            This Visit's Progress   . COMPLETED: Blood Pressure Management       Current Barriers:  . Chronic Disease Management education and support related to hypertension self management.  Case Manager Clinical Goal(s):  Marland Kitchen Over the next 60 days, patient will verbalize understanding of plan for hypertension management. . Over the next 60 days, patient will demonstrate adherence to prescribed treatment plan for hypertension as evidenced by taking all medications as prescribed, monitoring and recording blood pressure as directed, and adhering to a cardiac prudent diet.  Interventions:  . Reviewed care management goals. Jeffery Davis is doing very well. He remains active and compliant with medications and provider recommendations. His care management goals have been met. The care management team will gladly outreach in the future if his health status changes and he requires assistance.   Patient Self Care Activities:  . Self administers medications as prescribed . Attends provider appointments . Calls pharmacy for medication refills . Performs ADL's independently . Performs IADL's independently . Calls provider office for new concerns or questions  Please see past updates related to this goal by clicking on the "Past Updates" button in the selected goal            Jeffery Davis has met his chronic care management goals. The care management team will gladly outreach if his health status changes and he requires  assistance.    Jeffery Davis [facility] Care Management [phone]

## 2019-10-22 DIAGNOSIS — I428 Other cardiomyopathies: Secondary | ICD-10-CM | POA: Diagnosis not present

## 2019-10-22 DIAGNOSIS — I480 Paroxysmal atrial fibrillation: Secondary | ICD-10-CM | POA: Diagnosis not present

## 2019-10-22 DIAGNOSIS — I10 Essential (primary) hypertension: Secondary | ICD-10-CM | POA: Diagnosis not present

## 2019-10-28 DIAGNOSIS — M5432 Sciatica, left side: Secondary | ICD-10-CM | POA: Diagnosis not present

## 2019-11-04 DIAGNOSIS — M25552 Pain in left hip: Secondary | ICD-10-CM | POA: Diagnosis not present

## 2019-11-04 DIAGNOSIS — S76012D Strain of muscle, fascia and tendon of left hip, subsequent encounter: Secondary | ICD-10-CM | POA: Diagnosis not present

## 2019-11-10 LAB — CUP PACEART REMOTE DEVICE CHECK
Date Time Interrogation Session: 20210827235041
Implantable Pulse Generator Implant Date: 20200217

## 2019-11-11 ENCOUNTER — Ambulatory Visit (INDEPENDENT_AMBULATORY_CARE_PROVIDER_SITE_OTHER): Payer: PPO | Admitting: *Deleted

## 2019-11-11 DIAGNOSIS — I480 Paroxysmal atrial fibrillation: Secondary | ICD-10-CM | POA: Diagnosis not present

## 2019-11-11 DIAGNOSIS — Z03818 Encounter for observation for suspected exposure to other biological agents ruled out: Secondary | ICD-10-CM | POA: Diagnosis not present

## 2019-11-11 DIAGNOSIS — U071 COVID-19: Secondary | ICD-10-CM | POA: Diagnosis not present

## 2019-11-11 DIAGNOSIS — Z20822 Contact with and (suspected) exposure to covid-19: Secondary | ICD-10-CM | POA: Diagnosis not present

## 2019-11-12 NOTE — Progress Notes (Signed)
Carelink Summary Report / Loop Recorder 

## 2019-11-13 ENCOUNTER — Telehealth: Payer: Self-pay | Admitting: Family

## 2019-11-13 ENCOUNTER — Other Ambulatory Visit: Payer: Self-pay | Admitting: Family

## 2019-11-13 DIAGNOSIS — E7800 Pure hypercholesterolemia, unspecified: Secondary | ICD-10-CM

## 2019-11-13 DIAGNOSIS — I10 Essential (primary) hypertension: Secondary | ICD-10-CM

## 2019-11-13 DIAGNOSIS — U071 COVID-19: Secondary | ICD-10-CM

## 2019-11-13 NOTE — Progress Notes (Signed)
I connected by phone with Jeffery Davis on 11/13/2019 at 11:15 AM to discuss the potential use of a new treatment for mild to moderate COVID-19 viral infection in non-hospitalized patients.  This patient is a 74 y.o. adult that meets the FDA criteria for Emergency Use Authorization of COVID monoclonal antibody casirivimab/imdevimab.  Has a (+) direct SARS-CoV-2 viral test result  Has mild or moderate COVID-19   Is NOT hospitalized due to COVID-19  Is within 10 days of symptom onset  Has at least one of the high risk factor(s) for progression to severe COVID-19 and/or hospitalization as defined in EUA.  Specific high risk criteria : Older age (>/= 74 yo) and Cardiovascular disease or hypertension   I have spoken and communicated the following to the patient or parent/caregiver regarding COVID monoclonal antibody treatment:  1. FDA has authorized the emergency use for the treatment of mild to moderate COVID-19 in adults and pediatric patients with positive results of direct SARS-CoV-2 viral testing who are 58 years of age and older weighing at least 40 kg, and who are at high risk for progressing to severe COVID-19 and/or hospitalization.  2. The significant known and potential risks and benefits of COVID monoclonal antibody, and the extent to which such potential risks and benefits are unknown.  3. Information on available alternative treatments and the risks and benefits of those alternatives, including clinical trials.  4. Patients treated with COVID monoclonal antibody should continue to self-isolate and use infection control measures (e.g., wear mask, isolate, social distance, avoid sharing personal items, clean and disinfect "high touch" surfaces, and frequent handwashing) according to CDC guidelines.   5. The patient or parent/caregiver has the option to accept or refuse COVID monoclonal antibody treatment.  After reviewing this information with the patient, The patient agreed to  proceed with receiving casirivimab\imdevimab infusion and will be provided a copy of the Fact sheet prior to receiving the infusion.   Mauricio Po 11/13/2019 11:15 AM

## 2019-11-13 NOTE — Telephone Encounter (Signed)
Called to Discuss with patient about Covid symptoms and the use of the monoclonal antibody infusion for those with mild to moderate Covid symptoms and at a high risk of hospitalization.     Pt appears to qualify for this infusion due to co-morbid conditions and/or a member of an at-risk group in accordance with the FDA Emergency Use Authorization.   [name] symptoms started on 11/10/19. Currently experiencing cough, congestion and fatigue. Did have low grade fever yesterday. Risk factors include age and cardiovascular disease. We discussed Regeneron treatment and he wishes to proceed.    [name],   You have been scheduled to receive Regeneron (the monoclonal antibody we discussed) on : 11/14/19 at 12:30pm  If you have been tested outside of a [facility] - you MUST bring a copy of your positive test with you the morning of your appointment. You may take a photo of this and upload to your MyChart portal or have the testing facility fax the result to [phone]    The address for the infusion clinic site is:  --GPS address is [address] - the parking is located near [employer] building where you will see  COVID19 Infusion feather banner marking the entrance to parking.   (see photos below)            --Enter into the 2nd entrance where the "wave, flag banner" is at the road. Turn into this 2nd entrance and immediately turn left to park in 1 of the 5 parking spots.   --Please stay in your car and call the desk for assistance inside [phone].   --Average time in department is roughly 2 hours for Regeneron treatment - this includes preparation of the medication, IV start and the required 1 hour monitoring after the infusion.    Should you develop worsening shortness of breath, chest pain or severe breathing problems please do not wait for this appointment and go to the Emergency room for evaluation and treatment. You will undergo another oxygen screen  before your infusion to ensure this is the best treatment option for you. There is a chance that the best decision may be to send you to the Emergency Room for evaluation at the time of your appointment.   The day of your visit you should: Marland Kitchen Get plenty of rest the night before and drink plenty of water . Eat a light meal/snack before coming and take your medications as prescribed  . Wear warm, comfortable clothes with a shirt that can roll-up over the elbow (will need IV start).  . Wear a mask  . Consider bringing some activity to help pass the time  Many commercial insurers are waiving bills related to [facility] treatment however some have ranged from $300-640. We are starting to see some insurers send bills to patients later for the administration of the medication - we are learning more information but you may receive a bill after your appointment.  Please contact your insurance agent to discuss prior to your appointment if you would like further details about billing specific to your policy.    Terri Piedra, NP 11/13/2019 11:06 AM

## 2019-11-14 ENCOUNTER — Ambulatory Visit (HOSPITAL_COMMUNITY)
Admission: RE | Admit: 2019-11-14 | Discharge: 2019-11-14 | Disposition: A | Discharge status: Home / Self Care | Payer: Medicare Other | Source: Ambulatory Visit | Attending: Pulmonary Disease | Admitting: Pulmonary Disease

## 2019-11-14 DIAGNOSIS — U071 COVID-19: Secondary | ICD-10-CM | POA: Diagnosis present

## 2019-11-14 DIAGNOSIS — I10 Essential (primary) hypertension: Secondary | ICD-10-CM | POA: Insufficient documentation

## 2019-11-14 DIAGNOSIS — E7800 Pure hypercholesterolemia, unspecified: Secondary | ICD-10-CM | POA: Insufficient documentation

## 2019-11-14 DIAGNOSIS — Z23 Encounter for immunization: Secondary | ICD-10-CM | POA: Diagnosis not present

## 2019-11-14 MED ORDER — DIPHENHYDRAMINE HCL 50 MG/ML IJ SOLN: 50.0000 mg | Freq: Once | INTRAMUSCULAR | Status: DC | PRN

## 2019-11-14 MED ORDER — FAMOTIDINE IN NACL 20-0.9 MG/50ML-% IV SOLN: 20.0000 mg | Freq: Once | INTRAVENOUS | Status: DC | PRN

## 2019-11-14 MED ORDER — EPINEPHRINE 0.3 MG/0.3ML IJ SOAJ: 0.3000 mg | Freq: Once | INTRAMUSCULAR | Status: DC | PRN

## 2019-11-14 MED ORDER — ALBUTEROL SULFATE HFA 108 (90 BASE) MCG/ACT IN AERS: 2.0000 | INHALATION_SPRAY | Freq: Once | RESPIRATORY_TRACT | Status: DC | PRN

## 2019-11-14 MED ORDER — SODIUM CHLORIDE 0.9 % IV SOLN
1200.0000 mg | Freq: Once | INTRAVENOUS | Status: AC
  Administered 2019-11-14: 1200 mg via INTRAVENOUS
  Filled 2019-11-14: qty 10

## 2019-11-14 MED ORDER — METHYLPREDNISOLONE SODIUM SUCC 125 MG IJ SOLR: 125.0000 mg | Freq: Once | INTRAMUSCULAR | Status: DC | PRN

## 2019-11-14 MED ORDER — SODIUM CHLORIDE 0.9 % IV SOLN: INTRAVENOUS | Status: DC | PRN

## 2019-11-14 NOTE — Progress Notes (Signed)
  Diagnosis: COVID-19  Physician:Dr. Asencion Noble Procedure: Covid Infusion Clinic Med: casirivimab\imdevimab infusion - Provided patient with casirivimab\imdevimab fact sheet for patients, parents and caregivers prior to infusion.  Complications: No immediate complications noted.  Discharge: Discharged home   Jeffery Davis 11/14/2019

## 2019-11-14 NOTE — Discharge Instructions (Signed)

## 2019-11-20 ENCOUNTER — Other Ambulatory Visit: Payer: Self-pay

## 2019-11-20 ENCOUNTER — Other Ambulatory Visit: Payer: Medicare Other

## 2019-11-20 DIAGNOSIS — Z20822 Contact with and (suspected) exposure to covid-19: Secondary | ICD-10-CM | POA: Diagnosis not present

## 2019-11-22 LAB — SARS-COV-2, NAA 2 DAY TAT

## 2019-11-22 LAB — NOVEL CORONAVIRUS, NAA: SARS-CoV-2, NAA: DETECTED — AB

## 2019-11-25 ENCOUNTER — Encounter: Payer: Self-pay | Admitting: Internal Medicine

## 2019-11-25 ENCOUNTER — Ambulatory Visit: Payer: Medicare Other | Admitting: Internal Medicine

## 2019-11-25 ENCOUNTER — Other Ambulatory Visit: Payer: Self-pay

## 2019-11-25 VITALS — BP 134/84 | HR 64 | Ht 68.0 in | Wt 170.0 lb

## 2019-11-25 DIAGNOSIS — I428 Other cardiomyopathies: Secondary | ICD-10-CM

## 2019-11-25 DIAGNOSIS — I10 Essential (primary) hypertension: Secondary | ICD-10-CM

## 2019-11-25 DIAGNOSIS — I483 Typical atrial flutter: Secondary | ICD-10-CM | POA: Diagnosis not present

## 2019-11-25 DIAGNOSIS — I4819 Other persistent atrial fibrillation: Secondary | ICD-10-CM | POA: Diagnosis not present

## 2019-11-25 NOTE — Progress Notes (Signed)
PCP: Jerrol Banana., MD Primary Cardiologist: Dr Ubaldo Glassing Primary EP: Dr Thompson Grayer Jeffery Davis is a 74 y.o. adult who presents today for routine electrophysiology followup.  Since last being seen in our clinic, the patient reports doing very well.  Today, he denies symptoms of palpitations, chest pain, shortness of breath,  lower extremity edema, dizziness, presyncope, or syncope.  The patient is otherwise without complaint today.   Past Medical History:  Diagnosis Date  . Actinic keratosis   . Allergy   . Arthritis    degenerative  . Atrial fibrillation ([place])    s/p cardioversion 07/18/17  . Benign fibroma of prostate   . Carpal tunnel syndrome, right   . Erectile dysfunction   . GERD (gastroesophageal reflux disease)   . Hyperlipidemia   . Hypertension   . Lumbar herniated disc   . Lumbar stenosis with neurogenic claudication   . LV dysfunction   . Neural foraminal stenosis of lumbosacral spine   . Primary localized osteoarthrosis of left shoulder 03/01/2016  . Wears hearing aid in both ears    Past Surgical History:  Procedure Laterality Date  . ABLATION OF DYSRHYTHMIC FOCUS  10/17/2017  . ATRIAL FIBRILLATION ABLATION N/A 10/17/2017   Procedure: ATRIAL FIBRILLATION ABLATION;  Surgeon: Thompson Grayer, MD;  Location: [place] [facility];  Service: Cardiovascular;  Laterality: N/A;  . CARDIAC CATHETERIZATION  2005   [place] N/A 07/18/2017   Procedure: CARDIOVERSION;  Surgeon: Teodoro Spray, MD;  Location: [facility];  Service: Cardiovascular;  Laterality: N/A;  . CATARACT EXTRACTION  06/2002  . COLONOSCOPY    . COLONOSCOPY WITH PROPOFOL N/A 05/03/2017   Procedure: COLONOSCOPY WITH PROPOFOL;  Surgeon: Manya Silvas, MD;  Location[facility];  Service: Endoscopy;  Laterality: N/A;  . ESOPHAGOGASTRODUODENOSCOPY (EGD) WITH PROPOFOL N/A 01/08/2016   Procedure: ESOPHAGOGASTRODUODENOSCOPY (EGD) WITH PROPOFOL;  Surgeon: Manya Silvas, MD;   Location: [facility] ENDOSCOPY;  Service: Endoscopy;  Laterality: N/A;  . ESOPHAGOGASTRODUODENOSCOPY (EGD) WITH PROPOFOL N/A 12/24/2018   Procedure: ESOPHAGOGASTRODUODENOSCOPY (EGD) WITH PROPOFOL;  Surgeon: [place], Benay Pike, MD;  Location: [facility] ENDOSCOPY;  Service: Gastroenterology;  Laterality: N/A;  . HAND SURGERY    . Implantable loop recorder placement  04/30/2018   MDT Reveal LINQ implanted [facility] [id number]) by Dr Rayann Heman for afib management post ablation.  Marland Kitchen KNEE ARTHROSCOPY Right   . LUMBAR LAMINECTOMY/DECOMPRESSION MICRODISCECTOMY N/A 01/09/2017   Procedure: LAMINECTOMY AND FORAMINOTOMY LUMBAR THREE-FOUR LUMBAR FOUR-FIVE  LUMBAR FIVE SACRAL ONE;  Surgeon: Newman Pies, MD;  Location: [place];  Service: Neurosurgery;  Laterality: N/A;  . SHOULDER SURGERY Left 2010  . TOTAL SHOULDER ARTHROPLASTY Left 03/01/2016   Procedure: REVERSE TOTAL SHOULDER ARTHROPLASTY;  Surgeon: Marchia Bond, MD;  Location: [place];  Service: Orthopedics;  Laterality: Left;    ROS- all systems are reviewed and negatives except as per HPI above  Current Outpatient Medications  Medication Sig Dispense Refill  . amLODipine (NORVASC) 2.5 MG tablet Take 1 tablet by mouth daily.    . Cholecalciferol (VITAMIN D3) 5000 units TABS Take 5,000 Units by mouth daily.    . Coenzyme Q10 (CO Q 10) 100 MG CAPS Take 100 mg by mouth 2 (two) times daily.     . diphenhydramine-acetaminophen (TYLENOL PM) 25-500 MG TABS tablet Take 2 tablets by mouth at bedtime as needed (sleep).    Marland Kitchen ketoconazole (NIZORAL) 2 % cream Apply 1 application topically every other day.    . Magnesium 250 MG  TABS Take 250 mg by mouth 2 (two) times daily.    . Misc Natural Products (GLUCOSAMINE CHONDROITIN TRIPLE) TABS Take 1 tablet by mouth 2 (two) times daily.    Marland Kitchen omeprazole (PRILOSEC) 20 MG capsule TAKE 1 CAPSULE BY MOUTH ONCE DAILY 90 capsule 1  . rosuvastatin (CRESTOR) 10 MG tablet Take 1 tablet (10 mg total) by mouth daily. 90 tablet 3  . sildenafil  (REVATIO) 20 MG tablet TAKE ONE TABLET BY MOUTH 3 TIMES DAILY AS NEEDED 90 tablet 3  . tadalafil (CIALIS) 20 MG tablet 0.5-1 tab 1 hour prior to intercourse 30 tablet 0  . telmisartan (MICARDIS) 20 MG tablet Take 20 mg by mouth daily.      No current facility-administered medications for this visit.    Physical Exam: Vitals:   11/25/19 0959  BP: 134/84  Pulse: 64  SpO2: 95%  Weight: 170 lb (77.1 kg)  Height: 5\' 8"  (1.727 m)    GEN- The patient is well appearing, alert and oriented x 3 today.   Head- normocephalic, atraumatic Eyes-  Sclera clear, conjunctiva pink Ears- hearing intact Oropharynx- clear Lungs- Clear to ausculation bilaterally, normal work of breathing Heart- Regular rate and rhythm, no murmurs, rubs or gallops, PMI not laterally displaced GI- soft, NT, ND, + BS Extremities- no clubbing, cyanosis, or edema  Wt Readings from Last 3 Encounters:  11/25/19 170 lb (77.1 kg)  10/09/19 168 lb (76.2 kg)  10/09/19 168 lb 9.6 oz (76.5 kg)    EKG tracing ordered today is personally reviewed and shows sinus with first degree AV block  Assessment and Plan:  1. Persistent afib/ atrial flutter Well  Controlled post ablation off AAD therapy ILR reveals no afib chads2vasc score is 5.   He is clear in his decision to decline anticoagulation today. I spoke at length with the patient and his wife regarding recommendations nationally according to guidelines as well as my own medical opinion that I do think that he should be on long term anticoagulation.  He is aware but continues to decline anticoagulation at this time.  If his afib returns, he may be more willing to consider  2. HTN Stable No change required today  3. Nonischemic CM Improved post ablation   Risks, benefits and potential toxicities for medications prescribed and/or refilled reviewed with patient today.   Return to see me in a year Follow-up with Dr Ubaldo Glassing as scheduled  Thompson Grayer MD,  [facility] 11/25/2019 10:35 AM

## 2019-11-25 NOTE — Patient Instructions (Addendum)
Medication Instructions:  Your physician recommends that you continue on your current medications as directed. Please refer to the Current Medication list given to you today.  *If you need a refill on your cardiac medications before your next appointment, please call your pharmacy*  Lab Work: None ordered.  If you have labs (blood work) drawn today and your tests are completely normal, you will receive your results only by: Marland Kitchen MyChart Message (if you have MyChart) OR . A paper copy in the mail If you have any lab test that is abnormal or we need to change your treatment, we will call you to review the results.  Testing/Procedures: None ordered.  Follow-Up: At [facility], you and your health needs are our priority.  As part of our continuing mission to provide you with exceptional heart care, we have created designated Provider Care Teams.  These Care Teams include your primary Cardiologist (physician) and Advanced Practice Providers (APPs -  Physician Assistants and Nurse Practitioners) who all work together to provide you with the care you need, when you need it.  We recommend signing up for the patient portal called "MyChart".  Sign up information is provided on this After Visit Summary.  MyChart is used to connect with patients for Virtual Visits (Telemedicine).  Patients are able to view lab/test results, encounter notes, upcoming appointments, etc.  Non-urgent messages can be sent to your provider as well.   To learn more about what you can do with MyChart, go to [email or web address].    Your next appointment:   Your physician wants you to follow-up in: 12/01/19 at 2 pm with Dr. Rayann Heman.   Other Instructions:

## 2019-11-26 DIAGNOSIS — S76012D Strain of muscle, fascia and tendon of left hip, subsequent encounter: Secondary | ICD-10-CM | POA: Diagnosis not present

## 2019-11-26 DIAGNOSIS — M25552 Pain in left hip: Secondary | ICD-10-CM | POA: Diagnosis not present

## 2019-12-03 DIAGNOSIS — S76012D Strain of muscle, fascia and tendon of left hip, subsequent encounter: Secondary | ICD-10-CM | POA: Diagnosis not present

## 2019-12-03 DIAGNOSIS — M25552 Pain in left hip: Secondary | ICD-10-CM | POA: Diagnosis not present

## 2019-12-12 LAB — CUP PACEART REMOTE DEVICE CHECK
Date Time Interrogation Session: 20210929235126
Implantable Pulse Generator Implant Date: 20200217

## 2019-12-16 ENCOUNTER — Ambulatory Visit (INDEPENDENT_AMBULATORY_CARE_PROVIDER_SITE_OTHER): Payer: PPO

## 2019-12-16 DIAGNOSIS — I480 Paroxysmal atrial fibrillation: Secondary | ICD-10-CM

## 2019-12-17 NOTE — Progress Notes (Signed)
Carelink Summary Report / Loop Recorder 

## 2019-12-24 DIAGNOSIS — M1712 Unilateral primary osteoarthritis, left knee: Secondary | ICD-10-CM | POA: Diagnosis not present

## 2020-01-06 ENCOUNTER — Other Ambulatory Visit: Payer: Self-pay

## 2020-01-06 ENCOUNTER — Ambulatory Visit: Payer: PPO | Admitting: Dermatology

## 2020-01-06 DIAGNOSIS — L821 Other seborrheic keratosis: Secondary | ICD-10-CM

## 2020-01-06 DIAGNOSIS — D692 Other nonthrombocytopenic purpura: Secondary | ICD-10-CM

## 2020-01-06 DIAGNOSIS — L565 Disseminated superficial actinic porokeratosis (DSAP): Secondary | ICD-10-CM | POA: Diagnosis not present

## 2020-01-06 DIAGNOSIS — D1800 Hemangioma unspecified site: Secondary | ICD-10-CM

## 2020-01-06 DIAGNOSIS — L570 Actinic keratosis: Secondary | ICD-10-CM | POA: Diagnosis not present

## 2020-01-06 DIAGNOSIS — L578 Other skin changes due to chronic exposure to nonionizing radiation: Secondary | ICD-10-CM

## 2020-01-06 DIAGNOSIS — B351 Tinea unguium: Secondary | ICD-10-CM | POA: Diagnosis not present

## 2020-01-06 DIAGNOSIS — D229 Melanocytic nevi, unspecified: Secondary | ICD-10-CM

## 2020-01-06 DIAGNOSIS — B353 Tinea pedis: Secondary | ICD-10-CM

## 2020-01-06 DIAGNOSIS — Z1283 Encounter for screening for malignant neoplasm of skin: Secondary | ICD-10-CM

## 2020-01-06 DIAGNOSIS — L814 Other melanin hyperpigmentation: Secondary | ICD-10-CM | POA: Diagnosis not present

## 2020-01-06 DIAGNOSIS — L820 Inflamed seborrheic keratosis: Secondary | ICD-10-CM

## 2020-01-06 MED ORDER — KETOCONAZOLE 2 % EX CREA: TOPICAL_CREAM | CUTANEOUS | 11 refills | Status: DC

## 2020-01-06 NOTE — Patient Instructions (Addendum)
Melanoma ABCDEs  Melanoma is the most dangerous type of skin cancer, and is the leading cause of death from skin disease.  You are more likely to develop melanoma if you:  Have light-colored skin, light-colored eyes, or red or blond hair  Spend a lot of time in the sun  Tan regularly, either outdoors or in a tanning bed  Have had blistering sunburns, especially during childhood  Have a close family member who has had a melanoma  Have atypical moles or large birthmarks  Early detection of melanoma is key since treatment is typically straightforward and cure rates are extremely high if we catch it early.   The first sign of melanoma is often a change in a mole or a new dark spot.  The ABCDE system is a way of remembering the signs of melanoma.  A for asymmetry:  The two halves do not match. B for border:  The edges of the growth are irregular. C for color:  A mixture of colors are present instead of an even brown color. D for diameter:  Melanomas are usually (but not always) greater than 28mm - the size of a pencil eraser. E for evolution:  The spot keeps changing in size, shape, and color.  Please check your skin once per month between visits. You can use a small mirror in front and a large mirror behind you to keep an eye on the back side or your body.   If you see any new or changing lesions before your next follow-up, please call to schedule a visit.  Please continue daily skin protection including broad spectrum sunscreen SPF 30+ to sun-exposed areas, reapplying every 2 hours as needed when you're outdoors.   Cryotherapy Aftercare  . Wash gently with soap and water everyday.   Marland Kitchen Apply Vaseline and Band-Aid daily until healed.  Seborrheic Keratosis  What causes seborrheic keratoses? Seborrheic keratoses are harmless, common skin growths that first appear during adult life.  As time goes by, more growths appear.  Some people may develop a large number of them.  Seborrheic  keratoses appear on both covered and uncovered body parts.  They are not caused by sunlight.  The tendency to develop seborrheic keratoses can be inherited.  They vary in color from skin-colored to gray, brown, or even black.  They can be either smooth or have a rough, warty surface.   Seborrheic keratoses are superficial and look as if they were stuck on the skin.  Under the microscope this type of keratosis looks like layers upon layers of skin.  That is why at times the top layer may seem to fall off, but the rest of the growth remains and re-grows.    Treatment Seborrheic keratoses do not need to be treated, but can easily be removed in the office.  Seborrheic keratoses often cause symptoms when they rub on clothing or jewelry.  Lesions can be in the way of shaving.  If they become inflamed, they can cause itching, soreness, or burning.  Removal of a seborrheic keratosis can be accomplished by freezing, burning, or surgery. If any spot bleeds, scabs, or grows rapidly, please return to have it checked, as these can be an indication of a skin cancer.  Instructions for [facility] Medications  One or more of your medications was sent to the [facility] mail order compounding pharmacy. You will receive an email from them and can purchase the medicine through that link. It will then be mailed to your  home at the address you confirmed. If for any reason you do not receive an email from them, please check your spam folder. If you still do not find the email, please let us know. [facility] phone number is [phone].

## 2020-01-06 NOTE — Progress Notes (Signed)
Follow-Up Visit   Subjective  Jeffery Davis is a 74 y.o. adult who presents for the following: Annual Exam. The patient presents for Total-Body Skin Exam (TBSE) for skin cancer screening and mole check. Patient here for full body skin exam and skin cancer screening. No h/o skin cancer. Patient does have a history of AK's and DSAP. He has a few spots that he would like to point out to Dr. Nehemiah Massed.  The following portions of the chart were reviewed this encounter and updated as appropriate:  Tobacco  Allergies  Meds  Problems  Med Hx  Surg Hx  Fam Hx     Review of Systems:  No other skin or systemic complaints except as noted in HPI or Assessment and Plan.  Objective  Well appearing patient in no apparent distress; mood and affect are within normal limits.  A full examination was performed including scalp, head, eyes, ears, nose, lips, neck, chest, axillae, abdomen, back, buttocks, bilateral upper extremities, bilateral lower extremities, hands, feet, fingers, toes, fingernails, and toenails. All findings within normal limits unless otherwise noted below.  Objective  Face x 4 (4): Erythematous thin papules/macules with gritty scale.   Objective  Arms, legs: Pink patches of trunk, extremities  Objective  toenails: Toenail dystrophy  Objective  Bilateral feet: Scaling and maceration web spaces and over distal and lateral soles.   Objective  Right Shoulder x 1, right tricep x 1 (2): Erythematous keratotic or waxy stuck-on papule or plaque.    Assessment & Plan  AK (actinic keratosis) (4) Face x 4 Destruction of lesion - Face x 4 Complexity: simple   Destruction method: cryotherapy   Informed consent: discussed and consent obtained   Timeout:  patient name, date of birth, surgical site, and procedure verified Lesion destroyed using liquid nitrogen: Yes   Region frozen until ice ball extended beyond lesion: Yes   Outcome: patient tolerated procedure well with no  complications   Post-procedure details: wound care instructions given    DSAP (disseminated superficial actinic porokeratosis) Arms, legs Benign-appearing.  Observation.  Call clinic for new or changing lesions.  Recommend daily use of broad spectrum spf 30+ sunscreen to sun-exposed areas.   Start [facility] cholesterol/lovastatin cream 1-2 times daily to affected areas at arms and legs.   Tinea unguium toenails Ketoconazole cream.  Advised this will not completely clear up nails but may keep from progressing. Benign-appearing.  Observation.  Call clinic for new or changing lesions.  Recommend daily use of broad spectrum spf 30+ sunscreen to sun-exposed areas.   Tinea pedis of both feet Bilateral feet Restart ketoconazole 2% cream at bedtime to feet and in between toes.  Reordered Medications ketoconazole (NIZORAL) 2 % cream  Inflamed seborrheic keratosis (2) Right Shoulder x 1, right tricep x 1  Destruction of lesion - Right Shoulder x 1, right tricep x 1 Complexity: simple   Destruction method: cryotherapy   Informed consent: discussed and consent obtained   Timeout:  patient name, date of birth, surgical site, and procedure verified Lesion destroyed using liquid nitrogen: Yes   Region frozen until ice ball extended beyond lesion: Yes   Outcome: patient tolerated procedure well with no complications   Post-procedure details: wound care instructions given     Lentigines - Scattered tan macules - Discussed due to sun exposure - Benign, observe - Call for any changes  Seborrheic Keratoses - Stuck-on, waxy, tan-brown papules and plaques  - Discussed benign etiology and prognosis. - Observe - Call  for any changes  Melanocytic Nevi - Tan-brown and/or pink-flesh-colored symmetric macules and papules - Benign appearing on exam today - Observation - Call clinic for new or changing moles - Recommend daily use of broad spectrum spf 30+ sunscreen to sun-exposed areas.    Hemangiomas - Red papules - Discussed benign nature - Observe - Call for any changes  Actinic Damage - diffuse scaly erythematous macules with underlying dyspigmentation - Recommend daily broad spectrum sunscreen SPF 30+ to sun-exposed areas, reapply every 2 hours as needed.  - Call for new or changing lesions.  Skin cancer screening performed today.  Purpura - Violaceous macules and patches - Benign - Related to age, sun damage and/or use of blood thinners - Observe - Can use OTC arnica containing moisturizer such as Dermend Bruise Formula if desired - Call for worsening or other concerns   Return in about 1 year (around 01/05/2021) for TBSE.  Graciella Belton, RMA, am acting as scribe for Sarina Ser, MD . Documentation: I have reviewed the above documentation for accuracy and completeness, and I agree with the above.  Sarina Ser, MD

## 2020-01-07 ENCOUNTER — Encounter: Payer: Self-pay | Admitting: Dermatology

## 2020-01-15 LAB — CUP PACEART REMOTE DEVICE CHECK
Date Time Interrogation Session: 20211101235503
Implantable Pulse Generator Implant Date: 20200217

## 2020-01-17 DIAGNOSIS — M1712 Unilateral primary osteoarthritis, left knee: Secondary | ICD-10-CM | POA: Diagnosis not present

## 2020-01-20 ENCOUNTER — Ambulatory Visit (INDEPENDENT_AMBULATORY_CARE_PROVIDER_SITE_OTHER): Payer: PPO

## 2020-01-20 DIAGNOSIS — I4819 Other persistent atrial fibrillation: Secondary | ICD-10-CM

## 2020-01-20 NOTE — Progress Notes (Signed)
Carelink Summary Report / Loop Recorder 

## 2020-01-27 DIAGNOSIS — M1712 Unilateral primary osteoarthritis, left knee: Secondary | ICD-10-CM | POA: Diagnosis not present

## 2020-02-03 DIAGNOSIS — M1712 Unilateral primary osteoarthritis, left knee: Secondary | ICD-10-CM | POA: Diagnosis not present

## 2020-02-22 LAB — CUP PACEART REMOTE DEVICE CHECK
Date Time Interrogation Session: 20211204225856
Implantable Pulse Generator Implant Date: 20200217

## 2020-02-24 ENCOUNTER — Ambulatory Visit (INDEPENDENT_AMBULATORY_CARE_PROVIDER_SITE_OTHER): Payer: PPO

## 2020-02-24 DIAGNOSIS — I480 Paroxysmal atrial fibrillation: Secondary | ICD-10-CM

## 2020-02-26 DIAGNOSIS — M1712 Unilateral primary osteoarthritis, left knee: Secondary | ICD-10-CM | POA: Diagnosis not present

## 2020-03-09 NOTE — Progress Notes (Signed)
Carelink Summary Report / Loop Recorder 

## 2020-03-20 DIAGNOSIS — M1712 Unilateral primary osteoarthritis, left knee: Secondary | ICD-10-CM | POA: Diagnosis not present

## 2020-03-25 ENCOUNTER — Telehealth: Payer: Self-pay

## 2020-03-25 NOTE — Telephone Encounter (Signed)
Left detailed message for Jeffery Davis stating that patient has not been seen recently and does have an appt on 04/09/20.

## 2020-03-25 NOTE — Telephone Encounter (Signed)
Copied from [place] [id number]. Topic: General - Other >> Mar 25, 2020  9:19 AM Celene Kras wrote: Reason for CRM: Wenda Low, from [facility], called to check on a surgical clearacne that was faxed over on 03/23/20. Please advise.

## 2020-03-30 ENCOUNTER — Ambulatory Visit (INDEPENDENT_AMBULATORY_CARE_PROVIDER_SITE_OTHER): Payer: PPO

## 2020-03-30 DIAGNOSIS — I480 Paroxysmal atrial fibrillation: Secondary | ICD-10-CM | POA: Diagnosis not present

## 2020-03-30 LAB — CUP PACEART REMOTE DEVICE CHECK
Date Time Interrogation Session: 20220115235746
Implantable Pulse Generator Implant Date: 20200217

## 2020-04-06 ENCOUNTER — Telehealth: Payer: Self-pay

## 2020-04-06 NOTE — Telephone Encounter (Signed)
Copied from [place] [id number]. Topic: Appointment Scheduling - Scheduling Inquiry for Clinic >> Apr 06, 2020 10:17 AM Tessa Lerner A wrote: Reason for CRM: Patient has appointment with PCP scheduled for 04/09/20 at 8:00AM with Dr. Rosanna Randy Patient would like to cancel the appointment if possible but unsure if appointment is necessary to discuss things regarding an upcoming knee replacement surgery

## 2020-04-06 NOTE — Telephone Encounter (Addendum)
Please advise? We have a fax from [facility] at my desk for surgical clearance.

## 2020-04-07 NOTE — Telephone Encounter (Signed)
Keep appointment

## 2020-04-07 NOTE — Telephone Encounter (Signed)
Pt calling back to ask if Dr Rosanna Randy will sign off on his clearance form or does he need to come in? Pt has appt on Thurs, but doesn't think he needs to come in. Would like to know please. Pt is having knee replacement.

## 2020-04-07 NOTE — Telephone Encounter (Signed)
Patient was advised.  

## 2020-04-09 ENCOUNTER — Encounter: Payer: Self-pay | Admitting: Family Medicine

## 2020-04-09 ENCOUNTER — Ambulatory Visit (INDEPENDENT_AMBULATORY_CARE_PROVIDER_SITE_OTHER): Payer: PPO | Admitting: Family Medicine

## 2020-04-09 ENCOUNTER — Other Ambulatory Visit: Payer: Self-pay

## 2020-04-09 VITALS — BP 135/85 | HR 65 | Resp 16 | Ht 68.0 in | Wt 176.0 lb

## 2020-04-09 DIAGNOSIS — M1712 Unilateral primary osteoarthritis, left knee: Secondary | ICD-10-CM | POA: Diagnosis not present

## 2020-04-09 DIAGNOSIS — E7800 Pure hypercholesterolemia, unspecified: Secondary | ICD-10-CM | POA: Diagnosis not present

## 2020-04-09 DIAGNOSIS — Z01818 Encounter for other preprocedural examination: Secondary | ICD-10-CM | POA: Diagnosis not present

## 2020-04-09 NOTE — Progress Notes (Signed)
I,[name],acting as a scribe for Jeffery Durie, MD.,have documented all relevant documentation on the behalf of Jeffery Durie, MD,as directed by  Jeffery Durie, MD while in the presence of Jeffery Durie, MD.   Established patient visit   Patient: Jeffery Davis   DOB: 19-Feb-1946   75 y.o. Adult  MRN: [id number] Visit Date: 04/09/2020  Today's healthcare provider: [name], MD   Chief Complaint  Patient presents with  . Follow-up  . Hypertension   Subjective    HPI  Comes in today for medical clearance for left knee replacement.  Feels well and has no complaints.  No chest pain no shortness of breath no neurologic problems. He did have a DVT after previous orthopedic surgeries.  He had negative hematology work-up in 2018 after the DVT. Hypertension, follow-up  BP Readings from Last 3 Encounters:  04/09/20 135/85  11/25/19 134/84  11/14/19 137/90   Wt Readings from Last 3 Encounters:  04/09/20 176 lb (79.8 kg)  11/25/19 170 lb (77.1 kg)  10/09/19 168 lb (76.2 kg)     He was last seen for hypertension 6 months ago.  BP at that visit was 108/78. Management since that visit includes; on amlodipine and telmisartan.  He reports good compliance with treatment. He is not having side effects. none He is exercising. He is adherent to low salt diet.   Outside blood pressures are 130/80 .  He does not smoke.  Use of agents associated with hypertension: none.   --------------------------------------------------------------------      Medications: Outpatient Medications Prior to Visit  Medication Sig  . amLODipine (NORVASC) 2.5 MG tablet Take 1 tablet by mouth daily.  . Cholecalciferol (VITAMIN D3) 5000 units TABS Take 5,000 Units by mouth daily.  . Coenzyme Q10 (CO Q 10) 100 MG CAPS Take 100 mg by mouth 2 (two) times daily.   . diphenhydramine-acetaminophen (TYLENOL PM) 25-500 MG TABS tablet Take 2 tablets by mouth at bedtime as needed  (sleep).  Marland Kitchen ketoconazole (NIZORAL) 2 % cream Apply every night to feet and in between toes.  . Magnesium 250 MG TABS Take 250 mg by mouth 2 (two) times daily.  . Misc Natural Products (GLUCOSAMINE CHONDROITIN TRIPLE) TABS Take 1 tablet by mouth 2 (two) times daily.  Marland Kitchen omeprazole (PRILOSEC) 20 MG capsule TAKE 1 CAPSULE BY MOUTH ONCE DAILY  . rosuvastatin (CRESTOR) 10 MG tablet Take 1 tablet (10 mg total) by mouth daily.  . sildenafil (REVATIO) 20 MG tablet TAKE ONE TABLET BY MOUTH 3 TIMES DAILY AS NEEDED  . tadalafil (CIALIS) 20 MG tablet 0.5-1 tab 1 hour prior to intercourse  . telmisartan (MICARDIS) 20 MG tablet Take 20 mg by mouth daily.    No facility-administered medications prior to visit.    Review of Systems     Objective    BP 135/85 (BP Location: Right Arm, Patient Position: Sitting, Cuff Size: Large)   Pulse 65   Resp 16   Ht 5\' 8"  (1.727 m)   Wt 176 lb (79.8 kg)   SpO2 100%   BMI 26.76 kg/m     Physical Exam Vitals and nursing note reviewed.  Constitutional:      Appearance: Normal appearance. He is normal weight.  HENT:     Head: Normocephalic and atraumatic.     Right Ear: External ear normal.     Left Ear: External ear normal.  Eyes:     General: No scleral icterus.  Conjunctiva/sclera: Conjunctivae normal.  Cardiovascular:     Rate and Rhythm: Normal rate and regular rhythm.     Pulses: Normal pulses.     Heart sounds: Normal heart sounds.  Pulmonary:     Effort: Pulmonary effort is normal.     Breath sounds: Normal breath sounds.  Musculoskeletal:     Cervical back: Normal range of motion and neck supple.  Skin:    General: Skin is warm and dry.  Neurological:     General: No focal deficit present.     Mental Status: He is alert and oriented to person, place, and time.  Psychiatric:        Mood and Affect: Mood normal.        Behavior: Behavior normal.        Thought Content: Thought content normal.        Judgment: Judgment normal.        No results found for any visits on 04/09/20.  Assessment & Plan     1. Preoperative general physical examination Patient cleared for surgery. Once he is given clearance to bear and move around encourage patient to get up regularly due to history of previous DVT after shoulder surgery.  2. Hypercholesteremia   3. Primary osteoarthritis of left knee    No follow-ups on file.      I, Jeffery Durie, MD, have reviewed all documentation for this visit. The documentation on 04/10/20 for the exam, diagnosis, procedures, and orders are all accurate and complete.    Calin Ellery Cranford Mon, MD  [facility] [phone] (phone) [phone] (fax)  [employer]

## 2020-04-11 NOTE — Progress Notes (Signed)
Carelink Summary Report / Loop Recorder 

## 2020-04-14 DIAGNOSIS — M5417 Radiculopathy, lumbosacral region: Secondary | ICD-10-CM | POA: Diagnosis not present

## 2020-04-14 DIAGNOSIS — M5127 Other intervertebral disc displacement, lumbosacral region: Secondary | ICD-10-CM | POA: Diagnosis not present

## 2020-04-15 DIAGNOSIS — M1712 Unilateral primary osteoarthritis, left knee: Secondary | ICD-10-CM | POA: Diagnosis not present

## 2020-04-15 DIAGNOSIS — Z01812 Encounter for preprocedural laboratory examination: Secondary | ICD-10-CM | POA: Diagnosis not present

## 2020-04-17 ENCOUNTER — Encounter: Payer: Self-pay | Admitting: Family Medicine

## 2020-04-20 DIAGNOSIS — E785 Hyperlipidemia, unspecified: Secondary | ICD-10-CM | POA: Diagnosis not present

## 2020-04-20 DIAGNOSIS — M1712 Unilateral primary osteoarthritis, left knee: Secondary | ICD-10-CM | POA: Diagnosis not present

## 2020-04-20 DIAGNOSIS — Z96612 Presence of left artificial shoulder joint: Secondary | ICD-10-CM | POA: Diagnosis not present

## 2020-04-20 DIAGNOSIS — I10 Essential (primary) hypertension: Secondary | ICD-10-CM | POA: Diagnosis not present

## 2020-04-20 DIAGNOSIS — Z7982 Long term (current) use of aspirin: Secondary | ICD-10-CM | POA: Diagnosis not present

## 2020-04-20 DIAGNOSIS — K219 Gastro-esophageal reflux disease without esophagitis: Secondary | ICD-10-CM | POA: Diagnosis not present

## 2020-04-24 DIAGNOSIS — M25662 Stiffness of left knee, not elsewhere classified: Secondary | ICD-10-CM | POA: Diagnosis not present

## 2020-04-24 DIAGNOSIS — Z96652 Presence of left artificial knee joint: Secondary | ICD-10-CM | POA: Diagnosis not present

## 2020-04-27 DIAGNOSIS — M25662 Stiffness of left knee, not elsewhere classified: Secondary | ICD-10-CM | POA: Diagnosis not present

## 2020-04-27 DIAGNOSIS — Z96652 Presence of left artificial knee joint: Secondary | ICD-10-CM | POA: Diagnosis not present

## 2020-05-01 DIAGNOSIS — Z96652 Presence of left artificial knee joint: Secondary | ICD-10-CM | POA: Diagnosis not present

## 2020-05-01 DIAGNOSIS — M25662 Stiffness of left knee, not elsewhere classified: Secondary | ICD-10-CM | POA: Diagnosis not present

## 2020-05-02 LAB — CUP PACEART REMOTE DEVICE CHECK
Date Time Interrogation Session: 20220217235842
Implantable Pulse Generator Implant Date: 20200217

## 2020-05-04 ENCOUNTER — Ambulatory Visit (INDEPENDENT_AMBULATORY_CARE_PROVIDER_SITE_OTHER): Payer: PPO

## 2020-05-04 DIAGNOSIS — R002 Palpitations: Secondary | ICD-10-CM

## 2020-05-04 DIAGNOSIS — Z96652 Presence of left artificial knee joint: Secondary | ICD-10-CM | POA: Diagnosis not present

## 2020-05-04 DIAGNOSIS — I480 Paroxysmal atrial fibrillation: Secondary | ICD-10-CM

## 2020-05-04 DIAGNOSIS — Z96659 Presence of unspecified artificial knee joint: Secondary | ICD-10-CM | POA: Diagnosis not present

## 2020-05-04 DIAGNOSIS — M25662 Stiffness of left knee, not elsewhere classified: Secondary | ICD-10-CM | POA: Diagnosis not present

## 2020-05-07 DIAGNOSIS — Z96652 Presence of left artificial knee joint: Secondary | ICD-10-CM | POA: Diagnosis not present

## 2020-05-07 DIAGNOSIS — M25662 Stiffness of left knee, not elsewhere classified: Secondary | ICD-10-CM | POA: Diagnosis not present

## 2020-05-07 NOTE — Progress Notes (Signed)
Carelink Summary Report / Loop Recorder 

## 2020-05-11 DIAGNOSIS — M25662 Stiffness of left knee, not elsewhere classified: Secondary | ICD-10-CM | POA: Diagnosis not present

## 2020-05-11 DIAGNOSIS — Z96652 Presence of left artificial knee joint: Secondary | ICD-10-CM | POA: Diagnosis not present

## 2020-05-13 ENCOUNTER — Other Ambulatory Visit: Payer: Self-pay | Admitting: Family Medicine

## 2020-05-13 DIAGNOSIS — K219 Gastro-esophageal reflux disease without esophagitis: Secondary | ICD-10-CM

## 2020-05-14 DIAGNOSIS — Z96652 Presence of left artificial knee joint: Secondary | ICD-10-CM | POA: Diagnosis not present

## 2020-05-14 DIAGNOSIS — M25662 Stiffness of left knee, not elsewhere classified: Secondary | ICD-10-CM | POA: Diagnosis not present

## 2020-05-18 ENCOUNTER — Other Ambulatory Visit: Payer: Self-pay | Admitting: Family Medicine

## 2020-05-18 DIAGNOSIS — M25662 Stiffness of left knee, not elsewhere classified: Secondary | ICD-10-CM | POA: Diagnosis not present

## 2020-05-18 DIAGNOSIS — Z96652 Presence of left artificial knee joint: Secondary | ICD-10-CM | POA: Diagnosis not present

## 2020-05-18 DIAGNOSIS — E7800 Pure hypercholesterolemia, unspecified: Secondary | ICD-10-CM

## 2020-05-18 NOTE — Telephone Encounter (Signed)
Requested Prescriptions  Pending Prescriptions Disp Refills  . rosuvastatin (CRESTOR) 10 MG tablet [Pharmacy Med Name: ROSUVASTATIN CALCIUM 10 MG TAB] 90 tablet 1    Sig: TAKE 1 TABLET BY MOUTH DAILY     Cardiovascular:  Antilipid - Statins Failed - 05/18/2020 10:57 AM      Failed - LDL in normal range and within 360 days    Ldl Cholesterol, Calc  Date Value Ref Range Status  09/30/2011 57 0 - 100 mg/dL Final   LDL Chol Calc (NIH)  Date Value Ref Range Status  10/09/2019 80 0 - 99 mg/dL Final         Passed - Total Cholesterol in normal range and within 360 days    Cholesterol, Total  Date Value Ref Range Status  10/09/2019 159 100 - 199 mg/dL Final   Cholesterol  Date Value Ref Range Status  09/30/2011 173 0 - 200 mg/dL Final         Passed - HDL in normal range and within 360 days    HDL Cholesterol  Date Value Ref Range Status  09/30/2011 41 40 - 60 mg/dL Final   HDL  Date Value Ref Range Status  10/09/2019 60 >39 mg/dL Final         Passed - Triglycerides in normal range and within 360 days    Triglycerides  Date Value Ref Range Status  10/09/2019 107 0 - 149 mg/dL Final  09/30/2011 375 (H) 0 - 200 mg/dL Final         Passed - Patient is not pregnant      Passed - Valid encounter within last 12 months    Recent Outpatient Visits          1 month ago Preoperative general physical examination   [facility] Jerrol Banana., MD   7 months ago Annual physical exam   [facility] Jerrol Banana., MD   9 months ago Essential hypertension   [facility] Jerrol Banana., MD   10 months ago Colon Jerrol Banana., MD   1 year ago Failure of erection   [facility] Jerrol Banana., MD      Future Appointments            In 4 months Jerrol Banana., MD [facility] [place]

## 2020-05-21 DIAGNOSIS — Z96652 Presence of left artificial knee joint: Secondary | ICD-10-CM | POA: Diagnosis not present

## 2020-05-21 DIAGNOSIS — M25662 Stiffness of left knee, not elsewhere classified: Secondary | ICD-10-CM | POA: Diagnosis not present

## 2020-05-25 DIAGNOSIS — Z96652 Presence of left artificial knee joint: Secondary | ICD-10-CM | POA: Diagnosis not present

## 2020-05-25 DIAGNOSIS — M25662 Stiffness of left knee, not elsewhere classified: Secondary | ICD-10-CM | POA: Diagnosis not present

## 2020-05-28 DIAGNOSIS — Z96652 Presence of left artificial knee joint: Secondary | ICD-10-CM | POA: Diagnosis not present

## 2020-05-28 DIAGNOSIS — M25662 Stiffness of left knee, not elsewhere classified: Secondary | ICD-10-CM | POA: Diagnosis not present

## 2020-06-01 DIAGNOSIS — Z96652 Presence of left artificial knee joint: Secondary | ICD-10-CM | POA: Diagnosis not present

## 2020-06-01 DIAGNOSIS — M25662 Stiffness of left knee, not elsewhere classified: Secondary | ICD-10-CM | POA: Diagnosis not present

## 2020-06-04 DIAGNOSIS — Z96652 Presence of left artificial knee joint: Secondary | ICD-10-CM | POA: Diagnosis not present

## 2020-06-04 DIAGNOSIS — M25662 Stiffness of left knee, not elsewhere classified: Secondary | ICD-10-CM | POA: Diagnosis not present

## 2020-06-06 LAB — CUP PACEART REMOTE DEVICE CHECK
Date Time Interrogation Session: 20220323010006
Implantable Pulse Generator Implant Date: 20200217

## 2020-06-08 ENCOUNTER — Ambulatory Visit (INDEPENDENT_AMBULATORY_CARE_PROVIDER_SITE_OTHER): Payer: PPO

## 2020-06-08 DIAGNOSIS — I480 Paroxysmal atrial fibrillation: Secondary | ICD-10-CM | POA: Diagnosis not present

## 2020-06-17 NOTE — Progress Notes (Signed)
Carelink Summary Report / Loop Recorder 

## 2020-07-15 DIAGNOSIS — M25662 Stiffness of left knee, not elsewhere classified: Secondary | ICD-10-CM | POA: Diagnosis not present

## 2020-08-10 LAB — CUP PACEART REMOTE DEVICE CHECK
Date Time Interrogation Session: 20220528011132
Implantable Pulse Generator Implant Date: 20200217

## 2020-08-11 ENCOUNTER — Ambulatory Visit (INDEPENDENT_AMBULATORY_CARE_PROVIDER_SITE_OTHER): Payer: PPO

## 2020-08-11 DIAGNOSIS — I429 Cardiomyopathy, unspecified: Secondary | ICD-10-CM

## 2020-08-21 ENCOUNTER — Other Ambulatory Visit: Payer: Self-pay | Admitting: Family Medicine

## 2020-08-21 DIAGNOSIS — K219 Gastro-esophageal reflux disease without esophagitis: Secondary | ICD-10-CM

## 2020-08-28 DIAGNOSIS — D2261 Melanocytic nevi of right upper limb, including shoulder: Secondary | ICD-10-CM | POA: Diagnosis not present

## 2020-08-28 DIAGNOSIS — D225 Melanocytic nevi of trunk: Secondary | ICD-10-CM | POA: Diagnosis not present

## 2020-08-28 DIAGNOSIS — D2272 Melanocytic nevi of left lower limb, including hip: Secondary | ICD-10-CM | POA: Diagnosis not present

## 2020-08-28 DIAGNOSIS — L821 Other seborrheic keratosis: Secondary | ICD-10-CM | POA: Diagnosis not present

## 2020-08-28 DIAGNOSIS — D045 Carcinoma in situ of skin of trunk: Secondary | ICD-10-CM | POA: Diagnosis not present

## 2020-08-28 DIAGNOSIS — D2271 Melanocytic nevi of right lower limb, including hip: Secondary | ICD-10-CM | POA: Diagnosis not present

## 2020-08-28 DIAGNOSIS — D2262 Melanocytic nevi of left upper limb, including shoulder: Secondary | ICD-10-CM | POA: Diagnosis not present

## 2020-08-28 DIAGNOSIS — D485 Neoplasm of uncertain behavior of skin: Secondary | ICD-10-CM | POA: Diagnosis not present

## 2020-08-28 DIAGNOSIS — L570 Actinic keratosis: Secondary | ICD-10-CM | POA: Diagnosis not present

## 2020-09-01 NOTE — Progress Notes (Signed)
Carelink Summary Report / Loop Recorder 

## 2020-09-08 ENCOUNTER — Other Ambulatory Visit: Payer: Self-pay | Admitting: Family Medicine

## 2020-09-09 NOTE — Telephone Encounter (Signed)
   Notes to clinic:  medication filled by a historical provider  Review for continued use and refill    Requested Prescriptions  Pending Prescriptions Disp Refills   amLODipine (NORVASC) 5 MG tablet [Pharmacy Med Name: AMLODIPINE BESYLATE 5 MG TAB] 90 tablet     Sig: TAKE ONE TABLET BY MOUTH EVERY DAY      Cardiovascular:  Calcium Channel Blockers Passed - 09/08/2020  5:08 PM      Passed - Last BP in normal range    BP Readings from Last 1 Encounters:  04/09/20 135/85          Passed - Valid encounter within last 6 months    Recent Outpatient Visits           5 months ago Preoperative general physical examination   [facility] Jerrol Banana., MD   11 months ago Annual physical exam   [facility] Jerrol Banana., MD   1 year ago Essential hypertension   [facility] Jerrol Banana., MD   1 year ago [place] Jerrol Banana., MD   1 year ago Failure of erection   [facility] Jerrol Banana., MD       Future Appointments             In 1 month Jerrol Banana., MD [facility], [place]

## 2020-09-11 ENCOUNTER — Ambulatory Visit (INDEPENDENT_AMBULATORY_CARE_PROVIDER_SITE_OTHER): Payer: PPO

## 2020-09-11 DIAGNOSIS — I429 Cardiomyopathy, unspecified: Secondary | ICD-10-CM | POA: Diagnosis not present

## 2020-09-11 DIAGNOSIS — D045 Carcinoma in situ of skin of trunk: Secondary | ICD-10-CM | POA: Diagnosis not present

## 2020-09-11 LAB — CUP PACEART REMOTE DEVICE CHECK
Date Time Interrogation Session: 20220630011535
Implantable Pulse Generator Implant Date: 20200217

## 2020-09-30 NOTE — Progress Notes (Signed)
Carelink Summary Report / Loop Recorder 

## 2020-10-06 ENCOUNTER — Encounter: Payer: PPO | Admitting: Family Medicine

## 2020-10-12 DIAGNOSIS — M25662 Stiffness of left knee, not elsewhere classified: Secondary | ICD-10-CM | POA: Diagnosis not present

## 2020-10-13 ENCOUNTER — Encounter: Payer: PPO | Admitting: Family Medicine

## 2020-10-13 LAB — CUP PACEART REMOTE DEVICE CHECK
Date Time Interrogation Session: 20220802011942
Implantable Pulse Generator Implant Date: 20200217

## 2020-10-14 ENCOUNTER — Ambulatory Visit (INDEPENDENT_AMBULATORY_CARE_PROVIDER_SITE_OTHER): Payer: PPO

## 2020-10-14 DIAGNOSIS — I429 Cardiomyopathy, unspecified: Secondary | ICD-10-CM | POA: Diagnosis not present

## 2020-10-27 ENCOUNTER — Ambulatory Visit
Admission: RE | Admit: 2020-10-27 | Discharge: 2020-10-27 | Disposition: A | Discharge status: Home / Self Care | Payer: PPO | Source: Ambulatory Visit | Attending: Family Medicine | Admitting: Family Medicine

## 2020-10-27 ENCOUNTER — Ambulatory Visit (INDEPENDENT_AMBULATORY_CARE_PROVIDER_SITE_OTHER): Payer: PPO | Admitting: Family Medicine

## 2020-10-27 ENCOUNTER — Telehealth: Payer: Self-pay

## 2020-10-27 ENCOUNTER — Ambulatory Visit
Admission: RE | Admit: 2020-10-27 | Discharge: 2020-10-27 | Disposition: A | Discharge status: Home / Self Care | Payer: PPO | Attending: Family Medicine | Admitting: Family Medicine

## 2020-10-27 ENCOUNTER — Other Ambulatory Visit: Payer: Self-pay

## 2020-10-27 VITALS — BP 111/73 | HR 66 | Temp 98.5°F | Resp 16 | Ht 68.0 in | Wt 175.0 lb

## 2020-10-27 DIAGNOSIS — N529 Male erectile dysfunction, unspecified: Secondary | ICD-10-CM

## 2020-10-27 DIAGNOSIS — I10 Essential (primary) hypertension: Secondary | ICD-10-CM | POA: Diagnosis not present

## 2020-10-27 DIAGNOSIS — E7800 Pure hypercholesterolemia, unspecified: Secondary | ICD-10-CM

## 2020-10-27 DIAGNOSIS — I480 Paroxysmal atrial fibrillation: Secondary | ICD-10-CM

## 2020-10-27 DIAGNOSIS — M545 Low back pain, unspecified: Secondary | ICD-10-CM | POA: Insufficient documentation

## 2020-10-27 DIAGNOSIS — L565 Disseminated superficial actinic porokeratosis (DSAP): Secondary | ICD-10-CM

## 2020-10-27 DIAGNOSIS — I429 Cardiomyopathy, unspecified: Secondary | ICD-10-CM

## 2020-10-27 DIAGNOSIS — Z0000 Encounter for general adult medical examination without abnormal findings: Secondary | ICD-10-CM | POA: Diagnosis not present

## 2020-10-27 MED ORDER — TADALAFIL 20 MG PO TABS: 20.0000 mg | ORAL_TABLET | Freq: Three times a day (TID) | ORAL | 8 refills | Status: DC | PRN

## 2020-10-27 NOTE — Progress Notes (Signed)
I,[name],acting as a scribe for Jeffery Durie, MD.,have documented all relevant documentation on the behalf of Jeffery Durie, MD,as directed by  Jeffery Durie, MD while in the presence of Jeffery Durie, MD.   Complete physical exam   Patient: Jeffery Davis   DOB: [birth year]   75 y.o. Adult  MRN: [id number] Visit Date: 10/27/2020  Today's healthcare provider: [name], MD   Chief Complaint  Patient presents with   Annual Exam   Subjective    Jeffery Davis is a 75 y.o. adult who presents today for a complete physical exam.  He reports consuming a general diet. Home exercise routine includes walking, biking. He generally feels well. He reports sleeping fairly well. He does not have additional problems to discuss today.  HPI    Past Medical History:  Diagnosis Date   Actinic keratosis    Allergy    Arthritis    degenerative   Atrial fibrillation ([place])    s/p cardioversion 07/18/17   Benign fibroma of prostate    Carpal tunnel syndrome, right    Erectile dysfunction    GERD (gastroesophageal reflux disease)    Hyperlipidemia    Hypertension    Lumbar herniated disc    Lumbar stenosis with neurogenic claudication    LV dysfunction    Neural foraminal stenosis of lumbosacral spine    Primary localized osteoarthrosis of left shoulder 03/01/2016   Wears hearing aid in both ears    Past Surgical History:  Procedure Laterality Date   ABLATION OF DYSRHYTHMIC FOCUS  10/17/2017   ATRIAL FIBRILLATION ABLATION N/A 10/17/2017   Procedure: ATRIAL FIBRILLATION ABLATION;  Surgeon: Thompson Grayer, MD;  Location: [place] [facility];  Service: Cardiovascular;  Laterality: N/A;   CARDIAC CATHETERIZATION  2005   [place] N/A 07/18/2017   Procedure: CARDIOVERSION;  Surgeon: Teodoro Spray, MD;  Location: [facility] ORS;  Service: Cardiovascular;  Laterality: N/A;   CATARACT EXTRACTION  06/2002   COLONOSCOPY     COLONOSCOPY WITH PROPOFOL  N/A 05/03/2017   Procedure: COLONOSCOPY WITH PROPOFOL;  Surgeon: Manya Silvas, MD;  Location: [facility];  Service: Endoscopy;  Laterality: N/A;   ESOPHAGOGASTRODUODENOSCOPY (EGD) WITH PROPOFOL N/A 01/08/2016   Procedure: ESOPHAGOGASTRODUODENOSCOPY (EGD) WITH PROPOFOL;  Surgeon: Manya Silvas, MD;  Location: [facility] ENDOSCOPY;  Service: Endoscopy;  Laterality: N/A;   ESOPHAGOGASTRODUODENOSCOPY (EGD) WITH PROPOFOL N/A 12/24/2018   Procedure: ESOPHAGOGASTRODUODENOSCOPY (EGD) WITH PROPOFOL;  Surgeon: Toledo, Benay Pike, MD;  Location: [facility] ENDOSCOPY;  Service: Gastroenterology;  Laterality: N/A;   HAND SURGERY     Implantable loop recorder placement  04/30/2018   MDT Reveal LINQ implanted [facility] [id number]) by Dr Rayann Heman for afib management post ablation.   KNEE ARTHROSCOPY Right    LUMBAR LAMINECTOMY/DECOMPRESSION MICRODISCECTOMY N/A 01/09/2017   Procedure: LAMINECTOMY AND FORAMINOTOMY LUMBAR THREE-FOUR LUMBAR FOUR-FIVE  LUMBAR FIVE SACRAL ONE;  Surgeon: Newman Pies, MD;  Location: [place];  Service: Neurosurgery;  Laterality: N/A;   SHOULDER SURGERY Left 2010   TOTAL SHOULDER ARTHROPLASTY Left 03/01/2016   Procedure: REVERSE TOTAL SHOULDER ARTHROPLASTY;  Surgeon: Marchia Bond, MD;  Location: [place];  Service: Orthopedics;  Laterality: Left;   Social History   Socioeconomic History   Marital status: Married    Spouse name: Coralyn Mark   Number of children: 2   Years of education: bachelors   Highest education level: Bachelor's degree (e.g., BA, AB, BS)  Occupational History   Occupation: Retired  Comment: worked at [employer] 12 years.     Employer: [employer]  Tobacco Use   Smoking status: Never   Smokeless tobacco: Never  Vaping Use   Vaping Use: Never used  Substance and Sexual Activity   Alcohol use: Yes    Alcohol/week: 7.0 - 14.0 standard drinks    Types: 7 - 14 Glasses of wine per week    Comment: 1-2 glasses of wine daily   Drug use: No    Sexual activity: Not on file  Other Topics Concern   Not on file  Social History Narrative   ** Merged History Encounter **       Social Determinants of Health   Financial Resource Strain: Not on file  Food Insecurity: Not on file  Transportation Needs: Not on file  Physical Activity: Not on file  Stress: Not on file  Social Connections: Not on file  Intimate Partner Violence: Not on file   Family Status  Relation Name Status   Mother  Deceased   Father  Deceased   Daughter  Alive   Son  Alive   MGM  Deceased   MGF  Deceased   PGM  Deceased   PGF  Deceased   Family History  Problem Relation Age of Onset   Cancer Mother        breast and pancreatic   CAD Father    Hypertension Father    Healthy Daughter    Healthy Son    No Known Allergies  Patient Care Team: Jerrol Banana., MD as PCP - General (Family Medicine) Idelle Leech, OD as Consulting Physician (Optometry) Ralene Bathe, MD as Consulting Physician (Dermatology) Teodoro Spray, MD as Consulting Physician (Cardiology) Thompson Grayer, MD as Consulting Physician (Cardiology) Newman Pies, MD as Consulting Physician (Neurosurgery) Marchia Bond, MD as Consulting Physician (Orthopedic Surgery) Thornton Park, MD as Referring Physician (Orthopedic Surgery)   Medications: Outpatient Medications Prior to Visit  Medication Sig   amLODipine (NORVASC) 2.5 MG tablet Take 1 tablet by mouth daily.   Cholecalciferol (VITAMIN D3) 5000 units TABS Take 5,000 Units by mouth daily.   Coenzyme Q10 (CO Q 10) 100 MG CAPS Take 100 mg by mouth 2 (two) times daily.    Magnesium 250 MG TABS Take 250 mg by mouth 2 (two) times daily.   Misc Natural Products (GLUCOSAMINE CHONDROITIN TRIPLE) TABS Take 1 tablet by mouth 2 (two) times daily.   omeprazole (PRILOSEC) 20 MG capsule TAKE 1 CAPSULE BY MOUTH ONCE DAILY   rosuvastatin (CRESTOR) 10 MG tablet TAKE 1 TABLET BY MOUTH DAILY   sildenafil (REVATIO) 20 MG tablet  TAKE ONE TABLET BY MOUTH 3 TIMES DAILY AS NEEDED   [DISCONTINUED] tadalafil (CIALIS) 20 MG tablet 0.5-1 tab 1 hour prior to intercourse   telmisartan (MICARDIS) 20 MG tablet Take 20 mg by mouth daily.    [DISCONTINUED] amLODipine (NORVASC) 5 MG tablet TAKE ONE TABLET BY MOUTH EVERY DAY   [DISCONTINUED] diphenhydramine-acetaminophen (TYLENOL PM) 25-500 MG TABS tablet Take 2 tablets by mouth at bedtime as needed (sleep).   [DISCONTINUED] ketoconazole (NIZORAL) 2 % cream Apply every night to feet and in between toes.   No facility-administered medications prior to visit.    Review of Systems  Musculoskeletal:  Positive for arthralgias and back pain.  Skin:  Positive for rash.  Hematological:  Bruises/bleeds easily.  All other systems reviewed and are negative.     Objective  BP 111/73 (BP Location: Right Arm, Patient Position: Sitting, Cuff Size: Large)   Pulse 66   Temp 98.5 F (36.9 C) (Oral)   Resp 16   Ht '5\' 8"'$  (1.727 m)   Wt 175 lb (79.4 kg)   SpO2 98%   BMI 26.61 kg/m  BP Readings from Last 3 Encounters:  10/27/20 111/73  04/09/20 135/85  11/25/19 134/84   Wt Readings from Last 3 Encounters:  10/27/20 175 lb (79.4 kg)  04/09/20 176 lb (79.8 kg)  11/25/19 170 lb (77.1 kg)      Physical Exam Vitals reviewed.  Constitutional:      Appearance: Normal appearance. He is normal weight.  HENT:     Head: Normocephalic and atraumatic.     Right Ear: Tympanic membrane, ear canal and external ear normal.     Left Ear: Tympanic membrane, ear canal and external ear normal.     Nose: Nose normal.     Mouth/Throat:     Mouth: Mucous membranes are moist.     Pharynx: Oropharynx is clear.  Eyes:     Extraocular Movements: Extraocular movements intact.     Conjunctiva/sclera: Conjunctivae normal.     Pupils: Pupils are equal, round, and reactive to light.  Cardiovascular:     Rate and Rhythm: Normal rate and regular rhythm.     Pulses: Normal pulses.     Heart sounds:  Normal heart sounds.  Pulmonary:     Effort: Pulmonary effort is normal.     Breath sounds: Normal breath sounds.  Abdominal:     General: Abdomen is flat. Bowel sounds are normal.     Palpations: Abdomen is soft.  Genitourinary:    Penis: Normal.      Testes: Normal.  Musculoskeletal:     Cervical back: Normal range of motion and neck supple.  Skin:    General: Skin is warm and dry.  Neurological:     General: No focal deficit present.     Mental Status: He is alert and oriented to person, place, and time. Mental status is at baseline.  Psychiatric:        Mood and Affect: Mood normal.        Behavior: Behavior normal.        Thought Content: Thought content normal.        Judgment: Judgment normal.      Last depression screening scores PHQ 2/9 Scores 10/27/2020 10/09/2019 10/08/2018  PHQ - 2 Score 0 0 0  PHQ- 9 Score 1 - 0   Last fall risk screening Fall Risk  10/27/2020  Falls in the past year? 0  Comment -  Number falls in past yr: 0  Injury with Fall? 0  Comment -  Risk for fall due to : No Fall Risks  Follow up Falls evaluation completed   Last Audit-C alcohol use screening Alcohol Use Disorder Test (AUDIT) 10/27/2020  1. How often do you have a drink containing alcohol? 4  2. How many drinks containing alcohol do you have on a typical day when you are drinking? 0  3. How often do you have six or more drinks on one occasion? 0  AUDIT-C Score 4  4. How often during the last year have you found that you were not able to stop drinking once you had started? 0  5. How often during the last year have you failed to do what was normally expected from you because of drinking? 0  6. How often  during the last year have you needed a first drink in the morning to get yourself going after a heavy drinking session? 0  7. How often during the last year have you had a feeling of guilt of remorse after drinking? 0  8. How often during the last year have you been unable to remember  what happened the night before because you had been drinking? 0  9. Have you or someone else been injured as a result of your drinking? 0  10. Has a relative or friend or a doctor or another health worker been concerned about your drinking or suggested you cut down? 0  Alcohol Use Disorder Identification Test Final Score (AUDIT) 4  Alcohol Brief Interventions/Follow-up -   A score of 3 or more in women, and 4 or more in men indicates increased risk for alcohol abuse, EXCEPT if all of the points are from question 1   No results found for any visits on 10/27/20.  Assessment & Plan    Routine Health Maintenance and Physical Exam  Exercise Activities and Dietary recommendations  Goals      DIET - REDUCE PORTION SIZE     Recommend decreasing portion sizes by half and eating 3 small meals a day with two healthy snacks in between.      Reduce salt intake to 2 grams per day or less     Reduce salt intake to 2 grams per day or less        Immunization History  Administered Date(s) Administered   Fluad Quad(high Dose 65+) 12/05/2018   Influenza, High Dose Seasonal PF 01/08/2015, 12/08/2015, 12/30/2016, 12/04/2019   Influenza,inj,Quad PF,6+ Mos 01/07/2014   Influenza-Unspecified 12/31/2017   PFIZER(Purple Top)SARS-COV-2 Vaccination 04/12/2019, 05/01/2019   Pneumococcal Conjugate-13 11/10/2014   Pneumococcal Polysaccharide-23 10/06/2011   Td 12/22/2003   Tdap 10/06/2011   Zoster, Live 03/14/2010    Health Maintenance  Topic Date Due   Zoster Vaccines- Shingrix (1 of 2) Never done   COVID-19 Vaccine (3 - Pfizer risk series) 05/29/2019   COLONOSCOPY (Pts 45-84yr Insurance coverage will need to be confirmed)  05/03/2020   INFLUENZA VACCINE  10/12/2020   TETANUS/TDAP  10/05/2021   Hepatitis C Screening  Completed   PNA vac Low Risk Adult  Completed   HPV VACCINES  Aged Out    Discussed health benefits of physical activity, and encouraged him to engage in regular exercise  appropriate for his age and condition.  1. Annual physical exam  - CBC w/Diff/Platelet - Comprehensive Metabolic Panel (CMET) - TSH - Lipid panel  2. Essential hypertension  - CBC w/Diff/Platelet - Comprehensive Metabolic Panel (CMET) - TSH - Lipid panel  3. Hypercholesteremia  - CBC w/Diff/Platelet - Comprehensive Metabolic Panel (CMET) - TSH - Lipid panel  4. Paroxysmal atrial fibrillation (HCC)  - CBC w/Diff/Platelet - Comprehensive Metabolic Panel (CMET) - TSH - Lipid panel  5. Low back pain, unspecified back pain laterality, unspecified chronicity, unspecified whether sciatica present  - DG Lumbar Spine Complete  6. Failure of erection  - tadalafil (CIALIS) 20 MG tablet; Take 1 tablet (20 mg total) by mouth 3 (three) times daily as needed for erectile dysfunction.  Dispense: 40 tablet; Refill: 8 7.  DSA P On Efudex per dermatology  Return in about 6 months (around 04/29/2021).     I, RWilhemena Durie MD, have reviewed all documentation for this visit. The documentation on 11/02/20 for the exam, diagnosis, procedures, and orders are all accurate and complete.  Charene Mccallister Cranford Mon, MD  [facility] [phone] (phone) [phone] (fax)  [place]

## 2020-10-27 NOTE — Telephone Encounter (Signed)
Copied from [place] [id number]. Topic: Quick Communication - Rx Refill/Question >> Oct 27, 2020  5:01 PM Pawlus, Brayton Layman A wrote: [facility] called in regarding tadalafil (CIALIS) 20 MG tablet, caller needed clarification on the directions, stated 3 times daily seems unusual, please call back and advise.

## 2020-10-28 DIAGNOSIS — I480 Paroxysmal atrial fibrillation: Secondary | ICD-10-CM | POA: Diagnosis not present

## 2020-10-28 DIAGNOSIS — E7800 Pure hypercholesterolemia, unspecified: Secondary | ICD-10-CM | POA: Diagnosis not present

## 2020-10-28 DIAGNOSIS — Z0000 Encounter for general adult medical examination without abnormal findings: Secondary | ICD-10-CM | POA: Diagnosis not present

## 2020-10-28 DIAGNOSIS — I10 Essential (primary) hypertension: Secondary | ICD-10-CM | POA: Diagnosis not present

## 2020-10-28 MED ORDER — TADALAFIL 20 MG PO TABS: 20.0000 mg | ORAL_TABLET | ORAL | 5 refills | Status: DC | PRN

## 2020-10-28 NOTE — Addendum Note (Signed)
Addended by: Julieta Bellini on: 10/28/2020 11:40 AM   Modules accepted: Orders

## 2020-10-28 NOTE — Telephone Encounter (Signed)
Please clarify directions ° ° °

## 2020-10-28 NOTE — Telephone Encounter (Signed)
Resent rx with correct directions.

## 2020-10-29 LAB — CBC WITH DIFFERENTIAL/PLATELET
Basophils Absolute: 0.1 10*3/uL (ref 0.0–0.2)
Basos: 1 %
EOS (ABSOLUTE): 0.2 10*3/uL (ref 0.0–0.4)
Eos: 4 %
Hematocrit: 37.4 % — ABNORMAL LOW (ref 37.5–51.0)
Hemoglobin: 12.3 g/dL — ABNORMAL LOW (ref 13.0–17.7)
Immature Grans (Abs): 0 10*3/uL (ref 0.0–0.1)
Immature Granulocytes: 0 %
Lymphocytes Absolute: 1.6 10*3/uL (ref 0.7–3.1)
Lymphs: 35 %
MCH: 30.1 pg (ref 26.6–33.0)
MCHC: 32.9 g/dL (ref 31.5–35.7)
MCV: 92 fL (ref 79–97)
Monocytes Absolute: 0.6 10*3/uL (ref 0.1–0.9)
Monocytes: 13 %
Neutrophils Absolute: 2.1 10*3/uL (ref 1.4–7.0)
Neutrophils: 47 %
Platelets: 284 10*3/uL (ref 150–450)
RBC: 4.08 x10E6/uL — ABNORMAL LOW (ref 4.14–5.80)
RDW: 13.3 % (ref 11.6–15.4)
WBC: 4.5 10*3/uL (ref 3.4–10.8)

## 2020-10-29 LAB — COMPREHENSIVE METABOLIC PANEL
ALT: 17 IU/L (ref 0–44)
AST: 30 IU/L (ref 0–40)
Albumin/Globulin Ratio: 2 (ref 1.2–2.2)
Albumin: 4.5 g/dL (ref 3.7–4.7)
Alkaline Phosphatase: 78 IU/L (ref 44–121)
BUN/Creatinine Ratio: 25 — ABNORMAL HIGH (ref 10–24)
BUN: 25 mg/dL (ref 8–27)
Bilirubin Total: 0.4 mg/dL (ref 0.0–1.2)
CO2: 22 mmol/L (ref 20–29)
Calcium: 9.7 mg/dL (ref 8.6–10.2)
Chloride: 104 mmol/L (ref 96–106)
Creatinine, Ser: 1 mg/dL (ref 0.76–1.27)
Globulin, Total: 2.2 g/dL (ref 1.5–4.5)
Glucose: 93 mg/dL (ref 65–99)
Potassium: 4.2 mmol/L (ref 3.5–5.2)
Sodium: 141 mmol/L (ref 134–144)
Total Protein: 6.7 g/dL (ref 6.0–8.5)
eGFR: 78 mL/min/{1.73_m2} (ref >59)

## 2020-10-29 LAB — LIPID PANEL
Chol/HDL Ratio: 3.1 ratio (ref 0.0–5.0)
Cholesterol, Total: 153 mg/dL (ref 100–199)
HDL: 49 mg/dL (ref >39)
LDL Chol Calc (NIH): 77 mg/dL (ref 0–99)
Triglycerides: 159 mg/dL — ABNORMAL HIGH (ref 0–149)
VLDL Cholesterol Cal: 27 mg/dL (ref 5–40)

## 2020-10-29 LAB — TSH: TSH: 3.41 u[IU]/mL (ref 0.450–4.500)

## 2020-11-03 DIAGNOSIS — R7989 Other specified abnormal findings of blood chemistry: Secondary | ICD-10-CM | POA: Diagnosis not present

## 2020-11-03 DIAGNOSIS — R0600 Dyspnea, unspecified: Secondary | ICD-10-CM | POA: Diagnosis not present

## 2020-11-03 DIAGNOSIS — Z8673 Personal history of transient ischemic attack (TIA), and cerebral infarction without residual deficits: Secondary | ICD-10-CM | POA: Diagnosis not present

## 2020-11-03 DIAGNOSIS — M7989 Other specified soft tissue disorders: Secondary | ICD-10-CM | POA: Diagnosis not present

## 2020-11-03 DIAGNOSIS — R0602 Shortness of breath: Secondary | ICD-10-CM | POA: Diagnosis not present

## 2020-11-03 DIAGNOSIS — I10 Essential (primary) hypertension: Secondary | ICD-10-CM | POA: Diagnosis not present

## 2020-11-09 NOTE — Progress Notes (Signed)
Carelink Summary Report / Loop Recorder 

## 2020-11-17 ENCOUNTER — Ambulatory Visit (INDEPENDENT_AMBULATORY_CARE_PROVIDER_SITE_OTHER): Payer: PPO

## 2020-11-17 DIAGNOSIS — I480 Paroxysmal atrial fibrillation: Secondary | ICD-10-CM

## 2020-11-17 LAB — CUP PACEART REMOTE DEVICE CHECK
Date Time Interrogation Session: 20220904012126
Implantable Pulse Generator Implant Date: 20200217

## 2020-11-25 NOTE — Progress Notes (Signed)
Carelink Summary Report / Loop Recorder 

## 2020-11-30 ENCOUNTER — Other Ambulatory Visit: Payer: Self-pay

## 2020-11-30 ENCOUNTER — Ambulatory Visit (INDEPENDENT_AMBULATORY_CARE_PROVIDER_SITE_OTHER): Payer: PPO | Admitting: Internal Medicine

## 2020-11-30 ENCOUNTER — Other Ambulatory Visit: Payer: Self-pay | Admitting: Family Medicine

## 2020-11-30 VITALS — BP 124/68 | HR 9 | Ht 68.0 in | Wt 176.6 lb

## 2020-11-30 DIAGNOSIS — I428 Other cardiomyopathies: Secondary | ICD-10-CM | POA: Diagnosis not present

## 2020-11-30 DIAGNOSIS — I483 Typical atrial flutter: Secondary | ICD-10-CM

## 2020-11-30 DIAGNOSIS — I10 Essential (primary) hypertension: Secondary | ICD-10-CM

## 2020-11-30 DIAGNOSIS — E7800 Pure hypercholesterolemia, unspecified: Secondary | ICD-10-CM

## 2020-11-30 DIAGNOSIS — I4819 Other persistent atrial fibrillation: Secondary | ICD-10-CM | POA: Diagnosis not present

## 2020-11-30 NOTE — Patient Instructions (Signed)
Medication Instructions:  Your physician recommends that you continue on your current medications as directed. Please refer to the Current Medication list given to you today.  Labwork: None ordered.  Testing/Procedures: None ordered.  Follow-Up: Your physician wants you to follow-up in: 12 months with the [facility] Clinic. They will contact you to schedule.   Any Other Special Instructions Will Be Listed Below (If Applicable).  If you need a refill on your cardiac medications before your next appointment, please call your pharmacy.

## 2020-11-30 NOTE — Progress Notes (Signed)
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 adult who presents today for routine electrophysiology followup.  Since last being seen in our clinic, the patient reports doing very well.  Today, he denies symptoms of palpitations, chest pain, shortness of breath,  lower extremity edema, dizziness, presyncope, or syncope.  The patient is otherwise without complaint today.   Past Medical History:  Diagnosis Date   Actinic keratosis    Allergy    Arthritis    degenerative   Atrial fibrillation ([place])    s/p cardioversion 07/18/17   Benign fibroma of prostate    Carpal tunnel syndrome, right    Erectile dysfunction    GERD (gastroesophageal reflux disease)    Hyperlipidemia    Hypertension    Lumbar herniated disc    Lumbar stenosis with neurogenic claudication    LV dysfunction    Neural foraminal stenosis of lumbosacral spine    Primary localized osteoarthrosis of left shoulder 03/01/2016   Wears hearing aid in both ears    Past Surgical History:  Procedure Laterality Date   ABLATION OF DYSRHYTHMIC FOCUS  10/17/2017   ATRIAL FIBRILLATION ABLATION N/A 10/17/2017   Procedure: ATRIAL FIBRILLATION ABLATION;  Surgeon: Thompson Grayer, MD;  Location: [place] [facility];  Service: Cardiovascular;  Laterality: N/A;   CARDIAC CATHETERIZATION  2005   [place] N/A 07/18/2017   Procedure: CARDIOVERSION;  Surgeon: Teodoro Spray, MD;  Location: [facility] ORS;  Service: Cardiovascular;  Laterality: N/A;   CATARACT EXTRACTION  06/2002   COLONOSCOPY     COLONOSCOPY WITH PROPOFOL N/A 05/03/2017   Procedure: COLONOSCOPY WITH PROPOFOL;  Surgeon: Manya Silvas, MD;  Location: [facility] ENDOSCOPY;  Service: Endoscopy;  Laterality: N/A;   ESOPHAGOGASTRODUODENOSCOPY (EGD) WITH PROPOFOL N/A 01/08/2016   Procedure: ESOPHAGOGASTRODUODENOSCOPY (EGD) WITH PROPOFOL;  Surgeon: Manya Silvas, MD;  Location: [facility] ENDOSCOPY;  Service: Endoscopy;   Laterality: N/A;   ESOPHAGOGASTRODUODENOSCOPY (EGD) WITH PROPOFOL N/A 12/24/2018   Procedure: ESOPHAGOGASTRODUODENOSCOPY (EGD) WITH PROPOFOL;  Surgeon: Toledo, Benay Pike, MD;  Location: [facility] ENDOSCOPY;  Service: Gastroenterology;  Laterality: N/A;   HAND SURGERY     Implantable loop recorder placement  04/30/2018   MDT Reveal LINQ implanted [facility] [id number]) by Dr Rayann Heman for afib management post ablation.   KNEE ARTHROSCOPY Right    LUMBAR LAMINECTOMY/DECOMPRESSION MICRODISCECTOMY N/A 01/09/2017   Procedure: LAMINECTOMY AND FORAMINOTOMY LUMBAR THREE-FOUR LUMBAR FOUR-FIVE  LUMBAR FIVE SACRAL ONE;  Surgeon: Newman Pies, MD;  Location: [place];  Service: Neurosurgery;  Laterality: N/A;   SHOULDER SURGERY Left 2010   TOTAL SHOULDER ARTHROPLASTY Left 03/01/2016   Procedure: REVERSE TOTAL SHOULDER ARTHROPLASTY;  Surgeon: Marchia Bond, MD;  Location: [place];  Service: Orthopedics;  Laterality: Left;    ROS- all systems are reviewed and negatives except as per HPI above  Current Outpatient Medications  Medication Sig Dispense Refill   amLODipine (NORVASC) 2.5 MG tablet Take 1 tablet by mouth daily.     Cholecalciferol (VITAMIN D3) 5000 units TABS Take 5,000 Units by mouth daily.     Coenzyme Q10 (CO Q 10) 100 MG CAPS Take 100 mg by mouth 2 (two) times daily.      Magnesium 250 MG TABS Take 250 mg by mouth 2 (two) times daily.     Misc Natural Products (GLUCOSAMINE CHONDROITIN TRIPLE) TABS Take 1 tablet by mouth 2 (two) times daily.     omeprazole (PRILOSEC) 20 MG capsule  TAKE 1 CAPSULE BY MOUTH ONCE DAILY 90 capsule 1   rosuvastatin (CRESTOR) 10 MG tablet TAKE 1 TABLET BY MOUTH DAILY 90 tablet 1   tadalafil (CIALIS) 20 MG tablet Take 1 tablet (20 mg total) by mouth every three (3) days as needed for erectile dysfunction. 30 tablet 5   telmisartan (MICARDIS) 20 MG tablet Take 20 mg by mouth daily.      No current facility-administered medications for this visit.    Physical Exam: Vitals:    11/30/20 1406  BP: 124/68  Pulse: (!) 9  SpO2: 98%  Weight: 176 lb 9.6 oz (80.1 kg)  Height: '5\' 8"'$  (1.727 m)    GEN- The patient is well appearing, alert and oriented x 3 today.   Head- normocephalic, atraumatic Eyes-  Sclera clear, conjunctiva pink Ears- hearing intact Oropharynx- clear Lungs- Clear to ausculation bilaterally, normal work of breathing Heart- Regular rate and rhythm, no murmurs, rubs or gallops, PMI not laterally displaced GI- soft, NT, ND, + BS Extremities- no clubbing, cyanosis, or edema  Wt Readings from Last 3 Encounters:  11/30/20 176 lb 9.6 oz (80.1 kg)  10/27/20 175 lb (79.4 kg)  04/09/20 176 lb (79.8 kg)    EKG tracing ordered today is personally reviewed and shows sinus rhythm 69 bpm, PR 242mec, PVC  Assessment and Plan:  Afib Well controlled post ablation off AAD therapy He wishes to avoid OGarytherapy No afib by ILR (personally reviewed today)  2. HTN Stable No change required today  3. Edema He recently had dependant edema while at the beach with mild SOB.  This has resolved. Sodium restriction when at the beach is advised Echo stress test 1/21 from Dr FUbaldo Glassingis reviewed No further workup at this time.  Return in a year  JThompson GrayerMD, FSwift County Benson Hospital9/19/2022 2:18 PM

## 2020-12-04 DIAGNOSIS — X32XXXA Exposure to sunlight, initial encounter: Secondary | ICD-10-CM | POA: Diagnosis not present

## 2020-12-04 DIAGNOSIS — L570 Actinic keratosis: Secondary | ICD-10-CM | POA: Diagnosis not present

## 2020-12-05 ENCOUNTER — Ambulatory Visit (INDEPENDENT_AMBULATORY_CARE_PROVIDER_SITE_OTHER): Payer: PPO | Admitting: Family Medicine

## 2020-12-05 DIAGNOSIS — Z0000 Encounter for general adult medical examination without abnormal findings: Secondary | ICD-10-CM

## 2020-12-05 NOTE — Progress Notes (Signed)
I connected with  Jeffery Davis on 12/05/20 by phone enabled telemedicine application and verified that I am speaking with the correct person using two identifiers.   I discussed the limitations of evaluation and management by telemedicine. The patient expressed understanding and agreed to proceed.  Subjective:   Jeffery Davis is a 75 y.o. male who presents for Medicare Annual/Subsequent preventive examination.  Review of Systems     Defer to PCP Cardiac Risk Factors include: advanced age (>30men, >23 women)     Objective:    There were no vitals filed for this visit. There is no height or weight on file to calculate BMI.  Advanced Directives 12/05/2020 10/09/2019 10/08/2018 10/17/2017 10/03/2017 08/01/2017 07/18/2017  Does Patient Have a Medical Advance Directive? Yes Yes Yes Yes Yes Yes No  Type of Paramedic of [place];Living will [place];Living will [place];Living will [facility];Living will [place];Living will Living will -  Does patient want to make changes to medical advance directive? No - Patient declined - - No - Patient declined - No - Patient declined -  Copy of [place] in Chart? Yes - validated most recent copy scanned in chart (See row information) Yes - validated most recent copy scanned in chart (See row information) Yes - validated most recent copy scanned in chart (See row information) No - copy requested Yes - -  Would patient like information on creating a medical advance directive? - - - - - No - Patient declined No - Patient declined    Current Medications (verified) Outpatient Encounter Medications as of 12/05/2020  Medication Sig   amLODipine (NORVASC) 2.5 MG tablet Take 1 tablet by mouth daily.   Cholecalciferol (VITAMIN D3) 5000 units TABS Take 5,000 Units by mouth daily.   Coenzyme Q10 (CO Q 10) 100 MG CAPS Take 100 mg by mouth 2 (two) times  daily.    Magnesium 250 MG TABS Take 250 mg by mouth 2 (two) times daily.   Misc Natural Products (GLUCOSAMINE CHONDROITIN TRIPLE) TABS Take 1 tablet by mouth 2 (two) times daily.   omeprazole (PRILOSEC) 20 MG capsule TAKE 1 CAPSULE BY MOUTH ONCE DAILY   rosuvastatin (CRESTOR) 10 MG tablet TAKE 1 TABLET BY MOUTH DAILY   tadalafil (CIALIS) 20 MG tablet Take 1 tablet (20 mg total) by mouth every three (3) days as needed for erectile dysfunction.   telmisartan (MICARDIS) 20 MG tablet Take 20 mg by mouth daily.    No facility-administered encounter medications on file as of 12/05/2020.    Allergies (verified) Patient has no known allergies.   History: Past Medical History:  Diagnosis Date   Actinic keratosis    Allergy    Arthritis    degenerative   Atrial fibrillation ([place])    s/p cardioversion 07/18/17   Benign fibroma of prostate    Carpal tunnel syndrome, right    Erectile dysfunction    GERD (gastroesophageal reflux disease)    Hyperlipidemia    Hypertension    Lumbar herniated disc    Lumbar stenosis with neurogenic claudication    LV dysfunction    Neural foraminal stenosis of lumbosacral spine    Primary localized osteoarthrosis of left shoulder 03/01/2016   Wears hearing aid in both ears    Past Surgical History:  Procedure Laterality Date   ABLATION OF DYSRHYTHMIC FOCUS  10/17/2017   ATRIAL FIBRILLATION ABLATION N/A 10/17/2017   Procedure: ATRIAL  FIBRILLATION ABLATION;  Surgeon: Thompson Grayer, MD;  Location: [place] [facility];  Service: Cardiovascular;  Laterality: N/A;   CARDIAC CATHETERIZATION  2005   [place] N/A 07/18/2017   Procedure: CARDIOVERSION;  Surgeon: Teodoro Spray, MD;  Location: [facility];  Service: Cardiovascular;  Laterality: N/A;   CATARACT EXTRACTION  06/2002   COLONOSCOPY     COLONOSCOPY WITH PROPOFOL N/A 05/03/2017   Procedure: COLONOSCOPY WITH PROPOFOL;  Surgeon: Manya Silvas, MD;  Location: [facility] ENDOSCOPY;  Service:  Endoscopy;  Laterality: N/A;   ESOPHAGOGASTRODUODENOSCOPY (EGD) WITH PROPOFOL N/A 01/08/2016   Procedure: ESOPHAGOGASTRODUODENOSCOPY (EGD) WITH PROPOFOL;  Surgeon: Manya Silvas, MD;  Location: [facility] ENDOSCOPY;  Service: Endoscopy;  Laterality: N/A;   ESOPHAGOGASTRODUODENOSCOPY (EGD) WITH PROPOFOL N/A 12/24/2018   Procedure: ESOPHAGOGASTRODUODENOSCOPY (EGD) WITH PROPOFOL;  Surgeon: [place], Benay Pike, MD;  Location: [facility] ENDOSCOPY;  Service: Gastroenterology;  Laterality: N/A;   HAND SURGERY     Implantable loop recorder placement  04/30/2018   MDT Reveal LINQ implanted [facility] [id number]) by Dr Rayann Heman for afib management post ablation.   KNEE ARTHROSCOPY Right    LUMBAR LAMINECTOMY/DECOMPRESSION MICRODISCECTOMY N/A 01/09/2017   Procedure: LAMINECTOMY AND FORAMINOTOMY LUMBAR THREE-FOUR LUMBAR FOUR-FIVE  LUMBAR FIVE SACRAL ONE;  Surgeon: Newman Pies, MD;  Location: [place];  Service: Neurosurgery;  Laterality: N/A;   SHOULDER SURGERY Left 2010   TOTAL SHOULDER ARTHROPLASTY Left 03/01/2016   Procedure: REVERSE TOTAL SHOULDER ARTHROPLASTY;  Surgeon: Marchia Bond, MD;  Location: [place];  Service: Orthopedics;  Laterality: Left;   Family History  Problem Relation Age of Onset   Cancer Mother        breast and pancreatic   CAD Father    Hypertension Father    Healthy Daughter    Healthy Son    Social History   Socioeconomic History   Marital status: Married    Spouse name: Jeffery Davis   Number of children: 2   Years of education: bachelors   Highest education level: Bachelor's degree (e.g., BA, AB, BS)  Occupational History   Occupation: Retired     Comment: worked at [employer] 12 years.     Employer: [employer]  Tobacco Use   Smoking status: Never   Smokeless tobacco: Never  Vaping Use   Vaping Use: Never used  Substance and Sexual Activity   Alcohol use: Yes    Alcohol/week: 7.0 - 14.0 standard drinks    Types: 7 - 14 Glasses of wine per week    Comment:  1-2 glasses of wine daily   Drug use: No   Sexual activity: Not on file  Other Topics Concern   Not on file  Social History Narrative   ** Merged History Encounter **       Social Determinants of Health   Financial Resource Strain: Low Risk    Difficulty of Paying Living Expenses: Not hard at all  Food Insecurity: No Food Insecurity   Worried About Charity fundraiser in the Last Year: Never true   Ran Out of Food in the Last Year: Never true  Transportation Needs: No Transportation Needs   Lack of Transportation (Medical): No   Lack of Transportation (Non-Medical): No  Physical Activity: Sufficiently Active   Days of Exercise per Week: 7 days   Minutes of Exercise per Session: 80 min  Stress: No Stress Concern Present   Feeling of Stress : Not at all  Social Connections: Socially Integrated  Frequency of Communication with Friends and Family: More than three times a week   Frequency of Social Gatherings with Friends and Family: More than three times a week   Attends Religious Services: More than 4 times per year   Active Member of [employer] or Organizations: Yes   Attends Music therapist: More than 4 times per year   Marital Status: Married    Tobacco Counseling Counseling given: No   Clinical Intake:     Pain : No/denies pain     Diabetes: No  How often do you need to have someone help you when you read instructions, pamphlets, or other written materials from your doctor or pharmacy?: 1 - Never  Diabetic?No  Interpreter Needed?: No      Activities of Daily Living In your present state of health, do you have any difficulty performing the following activities: 12/05/2020 10/27/2020  Hearing? N Y  Vision? N N  Difficulty concentrating or making decisions? N N  Walking or climbing stairs? N N  Dressing or bathing? N N  Doing errands, shopping? N N  Preparing Food and eating ? N -  Using the Toilet? N -  In the past six months, have you  accidently leaked urine? N -  Do you have problems with loss of bowel control? N -  Managing your Finances? N -  Housekeeping or managing your Housekeeping? N -  Some recent data might be hidden    Patient Care Team: Jerrol Banana., MD as PCP - General (Family Medicine) Idelle Leech, [place] as Consulting Physician (Optometry) Ralene Bathe, MD as Consulting Physician (Dermatology) Ubaldo Glassing Javier Docker, MD as Consulting Physician (Cardiology) Thompson Grayer, MD as Consulting Physician (Cardiology) Newman Pies, MD as Consulting Physician (Neurosurgery) Marchia Bond, MD as Consulting Physician (Orthopedic Surgery) Thornton Park, MD as Referring Physician (Orthopedic Surgery)  Indicate any recent Medical Services you may have received from other than [facility] providers in the past year (date may be approximate).     Assessment:   This is a routine wellness examination for [place].  Hearing/Vision screen Hearing Screening - Comments:: Passed whisper test over phone  Vision Screening - Comments:: [place] yearly   Dietary issues and exercise activities discussed: Current Exercise Habits: Home exercise routine, Type of exercise: (S) strength training/weights;walking;stretching (Rides 10-20 miles on bike weekly.), Time (Minutes): > 60, Frequency (Times/Week): 7, Weekly Exercise (Minutes/Week): 0, Intensity: Intense   Goals Addressed   None   Depression Screen PHQ 2/9 Scores 12/05/2020 10/27/2020 10/09/2019 10/08/2018 10/08/2018 10/03/2017 10/03/2017  PHQ - 2 Score 0 0 0 0 0 0 0  PHQ- 9 Score 0 1 - 0 - 1 -    Fall Risk Fall Risk  12/05/2020 10/27/2020 10/09/2019 10/08/2019 10/08/2018  Falls in the past year? 0 0 0 0 0  Comment - - - Emmi Telephone Survey: data to providers prior to load -  Number falls in past yr: 0 0 0 - 0  Injury with Fall? 0 0 0 - 0  Comment - - - - -  Risk for fall due to : No Fall Risks No Fall Risks - - -  Follow up Falls evaluation completed  Falls evaluation completed - - -    FALL RISK PREVENTION PERTAINING TO THE HOME:  Any stairs in or around the home? No  If so, are there any without handrails? No  Home free of loose throw rugs in walkways, pet beds, electrical cords, etc?  Yes  Adequate lighting in your home to reduce risk of falls? Yes   ASSISTIVE DEVICES UTILIZED TO PREVENT FALLS:  Life alert? No  Use of a cane, walker or w/c? No Grab bars in the bathroom? No Shower chair or bench in shower? No Elevated toilet seat or a handicapped toilet? No    Cognitive Function:Passed Mini Mental      6CIT Screen 10/09/2019 09/27/2016  What Year? 0 points 0 points  What month? 0 points 0 points  What time? 0 points 0 points  Count back from 20 0 points 0 points  Months in reverse 0 points 0 points  Repeat phrase 0 points 2 points  Total Score 0 2    Immunizations Immunization History  Administered Date(s) Administered   Fluad Quad(high Dose 65+) 12/05/2018   Influenza, High Dose Seasonal PF 01/08/2015, 12/08/2015, 12/30/2016, 12/04/2019   Influenza,inj,Quad PF,6+ Mos 01/07/2014   Influenza-Unspecified 12/31/2017   PFIZER Comirnaty(Gray Top)Covid-19 Tri-Sucrose Vaccine 04/12/2019, 05/01/2019   Pneumococcal Conjugate-13 11/10/2014   Pneumococcal Polysaccharide-23 10/06/2011   Td 12/22/2003   Tdap 10/06/2011   Zoster, Live 03/14/2010    TDAP status: Up to date  Flu Vaccine status: Due, Education has been provided regarding the importance of this vaccine. Advised may receive this vaccine at local pharmacy or Health Dept. Aware to provide a copy of the vaccination record if obtained from local pharmacy or Health Dept. Verbalized acceptance and understanding.  Pneumococcal vaccine status: Due, Education has been provided regarding the importance of this vaccine. Advised may receive this vaccine at local pharmacy or Health Dept. Aware to provide a copy of the vaccination record if obtained from local pharmacy or Health  Dept. Verbalized acceptance and understanding.  Covid-19 vaccine status: Information provided on how to obtain vaccines.   Qualifies for Shingles Vaccine? Yes   Zostavax completed No   Shingrix Completed?: No.    Education has been provided regarding the importance of this vaccine. Patient has been advised to call insurance company to determine out of pocket expense if they have not yet received this vaccine. Advised may also receive vaccine at local pharmacy or Health Dept. Verbalized acceptance and understanding.  Screening Tests Health Maintenance  Topic Date Due   Zoster Vaccines- Shingrix (1 of 2) Never done   COVID-19 Vaccine (3 - Pfizer risk series) 05/29/2019   COLONOSCOPY (Pts 45-51yrs Insurance coverage will need to be confirmed)  05/03/2020   INFLUENZA VACCINE  10/12/2020   TETANUS/TDAP  10/05/2021   Hepatitis C Screening  Completed   HPV VACCINES  Aged Out    Health Maintenance  Health Maintenance Due  Topic Date Due   Zoster Vaccines- Shingrix (1 of 2) Never done   COVID-19 Vaccine (3 - Pfizer risk series) 05/29/2019   COLONOSCOPY (Pts 45-37yrs Insurance coverage will need to be confirmed)  05/03/2020   INFLUENZA VACCINE  10/12/2020    Colorectal cancer screening: Type of screening: Colonoscopy. Completed 2020. Repeat every 5 years  Lung Cancer Screening: (Low Dose CT Chest recommended if Age 63-80 years, 30 pack-year currently smoking OR have quit w/in 15years.) does not qualify.   Additional Screening:  Hepatitis C Screening: does qualify; Completed 10/2010  Vision Screening: Recommended annual ophthalmology exams for early detection of glaucoma and other disorders of the eye. Is the patient up to date with their annual eye exam?  Yes  Who is the provider or what is the name of the office in which the patient attends annual eye exams? [facility]  If pt is not established with a provider, would they like to be referred to a provider to establish care? No .    Dental Screening: Recommended annual dental exams for proper oral hygiene  Community Resource Referral / Chronic Care Management: CRR required this visit?  No   CCM required this visit?  No      Plan:     I have personally reviewed and noted the following in the patient's chart:   Medical and social history Use of alcohol, tobacco or illicit drugs  Current medications and supplements including opioid prescriptions. Patient is not currently taking opioid prescriptions. Functional ability and status Nutritional status Physical activity Advanced directives List of other physicians Hospitalizations, surgeries, and ER visits in previous 12 months Vitals Screenings to include cognitive, depression, and falls Referrals and appointments  In addition, I have reviewed and discussed with patient certain preventive protocols, quality metrics, and best practice recommendations. A written personalized care plan for preventive services as well as general preventive health recommendations were provided to patient.     Stephan Minister, [place]   12/05/2020

## 2020-12-21 ENCOUNTER — Ambulatory Visit (INDEPENDENT_AMBULATORY_CARE_PROVIDER_SITE_OTHER): Payer: PPO

## 2020-12-21 DIAGNOSIS — I4819 Other persistent atrial fibrillation: Secondary | ICD-10-CM

## 2020-12-23 LAB — CUP PACEART REMOTE DEVICE CHECK
Date Time Interrogation Session: 20221007022223
Implantable Pulse Generator Implant Date: 20200217

## 2020-12-29 NOTE — Progress Notes (Signed)
Carelink Summary Report / Loop Recorder 

## 2021-01-06 ENCOUNTER — Other Ambulatory Visit: Payer: Self-pay

## 2021-01-06 ENCOUNTER — Ambulatory Visit: Payer: Self-pay | Admitting: *Deleted

## 2021-01-06 MED ORDER — PANTOPRAZOLE SODIUM 40 MG PO TBEC: 40.0000 mg | DELAYED_RELEASE_TABLET | Freq: Two times a day (BID) | ORAL | 3 refills | Status: DC

## 2021-01-06 NOTE — Telephone Encounter (Signed)
Pt called stating that he is struggling with reflux/ indigestion. He states that he is on a medication, but that he it is not helping and it seems to be getting worse. No appts with PCP until February. Please advise.  Reason for Disposition  [1] Abdominal pain is intermittent AND [2] shoots into chest, with sour taste in mouth  Answer Assessment - Initial Assessment Questions 1. LOCATION: "Where does it hurt?"      Discomfort-pressure- breast bone- reflux at night 2. RADIATION: "Does the pain shoot anywhere else?" (e.g., chest, back)     No 3. ONSET: "When did the pain begin?" (e.g., minutes, hours or days ago)      1 month 4. SUDDEN: "Gradual or sudden onset?"     gradual 5. PATTERN "Does the pain come and go, or is it constant?"    - If constant: "Is it getting better, staying the same, or worsening?"      (Note: Constant means the pain never goes away completely; most serious pain is constant and it progresses)     - If intermittent: "How long does it last?" "Do you have pain now?"     (Note: Intermittent means the pain goes away completely between bouts)     Comes and goes 6. SEVERITY: "How bad is the pain?"  (e.g., Scale 1-10; mild, moderate, or severe)    - MILD (1-3): doesn't interfere with normal activities, abdomen soft and not tender to touch     - MODERATE (4-7): interferes with normal activities or awakens from sleep, abdomen tender to touch     - SEVERE (8-10): excruciating pain, doubled over, unable to do any normal activities       moderate 7. RECURRENT SYMPTOM: "Have you ever had this type of stomach pain before?" If Yes, ask: "When was the last time?" and "What happened that time?"      Yes- omeprazole in evening- 6:30-8, TUMS during the day if needed 8. AGGRAVATING FACTORS: "Does anything seem to cause this pain?" (e.g., foods, stress, alcohol)     Foods, laying down 9. CARDIAC SYMPTOMS: "Do you have any of the following symptoms: chest pain, difficulty breathing,  sweating, nausea?"     Chest pain- R breast area- may have lump-3 weeks 10. OTHER SYMPTOMS: "Do you have any other symptoms?" (e.g., back pain, diarrhea, fever, urination pain, vomiting)       No related symptoms 11. PREGNANCY: "Is there any chance you are pregnant?" "When was your last menstrual period?"       na  Protocols used: Abdominal Pain - Upper-A-AH

## 2021-01-06 NOTE — Telephone Encounter (Signed)
Call to patient- patient reports he is having increased reflux at night- will wake him. Patient takes Omeprazole in the evening at 6:30-8pm. Patient needs to discuss other management options. No appointment available with provider- office closed for lunch- will send message for scheduling- patient wants to see PCP.

## 2021-01-06 NOTE — Telephone Encounter (Signed)
Patient called and given information below from Dr. Rosanna Randy, patient verbalized understanding.

## 2021-01-06 NOTE — Telephone Encounter (Signed)
Change omeprazole to pantoprazole twice daily and see me in a week or 2

## 2021-01-11 ENCOUNTER — Encounter: Payer: PPO | Admitting: Dermatology

## 2021-01-13 ENCOUNTER — Telehealth: Payer: Self-pay

## 2021-01-13 NOTE — Telephone Encounter (Signed)
Copied from [place] [phone]. Topic: General - Call Back - No Documentation >> Jan 13, 2021  2:56 PM Yvette Rack wrote: Reason for CRM: Pt stated he had 2 missed calls and a message from [place] asking him to return the call. Pt requests call back

## 2021-01-18 ENCOUNTER — Ambulatory Visit (INDEPENDENT_AMBULATORY_CARE_PROVIDER_SITE_OTHER): Payer: PPO | Admitting: Family Medicine

## 2021-01-18 ENCOUNTER — Other Ambulatory Visit: Payer: Self-pay

## 2021-01-18 VITALS — BP 148/73 | HR 61 | Temp 97.9°F | Wt 175.0 lb

## 2021-01-18 DIAGNOSIS — K219 Gastro-esophageal reflux disease without esophagitis: Secondary | ICD-10-CM

## 2021-01-18 DIAGNOSIS — Z23 Encounter for immunization: Secondary | ICD-10-CM

## 2021-01-18 DIAGNOSIS — R052 Subacute cough: Secondary | ICD-10-CM

## 2021-01-18 DIAGNOSIS — N400 Enlarged prostate without lower urinary tract symptoms: Secondary | ICD-10-CM

## 2021-01-18 DIAGNOSIS — N61 Mastitis without abscess: Secondary | ICD-10-CM

## 2021-01-18 DIAGNOSIS — I10 Essential (primary) hypertension: Secondary | ICD-10-CM | POA: Diagnosis not present

## 2021-01-18 DIAGNOSIS — I4819 Other persistent atrial fibrillation: Secondary | ICD-10-CM | POA: Diagnosis not present

## 2021-01-18 MED ORDER — AMOXICILLIN-POT CLAVULANATE 875-125 MG PO TABS: 1.0000 | ORAL_TABLET | Freq: Two times a day (BID) | ORAL | 0 refills | Status: DC

## 2021-01-18 NOTE — Patient Instructions (Signed)
For Cough-use over the counter Robitussin twice daily

## 2021-01-18 NOTE — Progress Notes (Signed)
Established patient visit   Patient: Jeffery Davis   DOB: 1945-09-29   75 y.o. Adult  MRN: [id number] Visit Date: 01/18/2021  Today's healthcare provider: [name], MD   No chief complaint on file.  Subjective    HPI  Patient is here for follow up GERD. Changed omeprazole to pantoprazole twice daily on 01/06/2021. Patient was advised to follow up in 2 weeks.  He states his GERD is improving.  He has had some indigestion but no reflux.  He has no side effects to the medication.  Patient also complains about a tenderness in his right breast area.  He states that for about 1 month he has had an area in the right breast that has been tender to the touch or when he is running.  He has had no known trauma and feels no lumps.  He has had no bruising in the area.  He at first thought he may have bumped the area although he did not know of anything.     Medications: Outpatient Medications Prior to Visit  Medication Sig   amLODipine (NORVASC) 2.5 MG tablet Take 1 tablet by mouth daily.   Cholecalciferol (VITAMIN D3) 5000 units TABS Take 5,000 Units by mouth daily.   Coenzyme Q10 (CO Q 10) 100 MG CAPS Take 100 mg by mouth 2 (two) times daily.    Magnesium 250 MG TABS Take 250 mg by mouth 2 (two) times daily.   Misc Natural Products (GLUCOSAMINE CHONDROITIN TRIPLE) TABS Take 1 tablet by mouth 2 (two) times daily.   pantoprazole (PROTONIX) 40 MG tablet Take 1 tablet (40 mg total) by mouth 2 (two) times daily.   rosuvastatin (CRESTOR) 10 MG tablet TAKE 1 TABLET BY MOUTH DAILY   tadalafil (CIALIS) 20 MG tablet Take 1 tablet (20 mg total) by mouth every three (3) days as needed for erectile dysfunction.   telmisartan (MICARDIS) 20 MG tablet Take 20 mg by mouth daily.    No facility-administered medications prior to visit.    Review of Systems  Gastrointestinal:  Negative for abdominal pain, blood in stool, constipation, diarrhea, nausea and vomiting.       Objective    BP  (!) 148/73 (BP Location: Right Arm, Patient Position: Sitting, Cuff Size: Large)   Pulse 61   Temp 97.9 F (36.6 C) (Oral)   Wt 175 lb (79.4 kg)   SpO2 100%   BMI 26.61 kg/m  BP Readings from Last 3 Encounters:  01/18/21 (!) 148/73  11/30/20 124/68  10/27/20 111/73   Wt Readings from Last 3 Encounters:  01/18/21 175 lb (79.4 kg)  11/30/20 176 lb 9.6 oz (80.1 kg)  10/27/20 175 lb (79.4 kg)      Physical Exam Vitals and nursing note reviewed.  Constitutional:      Appearance: Normal appearance. He is normal weight.  HENT:     Head: Normocephalic and atraumatic.     Right Ear: External ear normal.     Left Ear: External ear normal.  Eyes:     General: No scleral icterus.    Conjunctiva/sclera: Conjunctivae normal.  Cardiovascular:     Rate and Rhythm: Normal rate and regular rhythm.     Pulses: Normal pulses.     Heart sounds: Normal heart sounds.  Pulmonary:     Effort: Pulmonary effort is normal.     Breath sounds: Normal breath sounds.  Abdominal:     Palpations: Abdomen is soft.  Musculoskeletal:  Cervical back: Normal range of motion and neck supple.  Skin:    General: Skin is warm and dry.  Neurological:     General: No focal deficit present.     Mental Status: He is alert and oriented to person, place, and time.  Psychiatric:        Mood and Affect: Mood normal.        Behavior: Behavior normal.        Thought Content: Thought content normal.        Judgment: Judgment normal.  Chest wall reveals no breast or chest masses.  Tenderness around the right chest wall are all to the areola but no breast or chest masses.   No results found for any visits on 01/18/21.  Assessment & Plan     1. Gastroesophageal reflux disease, unspecified whether esophagitis present Omeprazole for symptom control.  2. Need for influenza vaccination  - Flu Vaccine QUAD High Dose(Fluad)  3. Mastitis in male Treat as mastitis and refer to surgery for evaluation but I think  this is benign etiology. - amoxicillin-clavulanate (AUGMENTIN) 875-125 MG tablet; Take 1 tablet by mouth 2 (two) times daily.  Dispense: 20 tablet; Refill: 0 - Ambulatory referral to General Surgery  4. Subacute cough Try Robitussin twice a day  5. HTN, goal below 140/80   6. Persistent atrial fibrillation ([place])   7. Benign fibroma of prostate    No follow-ups on file.      I, [name], MD, have reviewed all documentation for this visit. The documentation on 01/23/21 for the exam, diagnosis, procedures, and orders are all accurate and complete.    Avraham Benish Cranford Mon, MD  [facility] [phone] (phone) [phone] (fax)  [place]

## 2021-01-21 LAB — CUP PACEART REMOTE DEVICE CHECK
Date Time Interrogation Session: 20221109012158
Implantable Pulse Generator Implant Date: 20200217

## 2021-01-25 ENCOUNTER — Ambulatory Visit (INDEPENDENT_AMBULATORY_CARE_PROVIDER_SITE_OTHER): Payer: PPO

## 2021-01-25 DIAGNOSIS — I428 Other cardiomyopathies: Secondary | ICD-10-CM | POA: Diagnosis not present

## 2021-02-01 NOTE — Progress Notes (Signed)
Carelink Summary Report / Loop Recorder 

## 2021-02-09 DIAGNOSIS — N644 Mastodynia: Secondary | ICD-10-CM | POA: Diagnosis not present

## 2021-02-12 DIAGNOSIS — D2262 Melanocytic nevi of left upper limb, including shoulder: Secondary | ICD-10-CM | POA: Diagnosis not present

## 2021-02-12 DIAGNOSIS — Z85828 Personal history of other malignant neoplasm of skin: Secondary | ICD-10-CM | POA: Diagnosis not present

## 2021-02-12 DIAGNOSIS — X32XXXA Exposure to sunlight, initial encounter: Secondary | ICD-10-CM | POA: Diagnosis not present

## 2021-02-12 DIAGNOSIS — D2261 Melanocytic nevi of right upper limb, including shoulder: Secondary | ICD-10-CM | POA: Diagnosis not present

## 2021-02-12 DIAGNOSIS — L570 Actinic keratosis: Secondary | ICD-10-CM | POA: Diagnosis not present

## 2021-02-12 DIAGNOSIS — D2272 Melanocytic nevi of left lower limb, including hip: Secondary | ICD-10-CM | POA: Diagnosis not present

## 2021-02-18 ENCOUNTER — Other Ambulatory Visit: Payer: Self-pay | Admitting: General Surgery

## 2021-02-18 DIAGNOSIS — N6311 Unspecified lump in the right breast, upper outer quadrant: Secondary | ICD-10-CM | POA: Diagnosis not present

## 2021-02-19 LAB — SURGICAL PATHOLOGY

## 2021-02-23 LAB — CUP PACEART REMOTE DEVICE CHECK
Date Time Interrogation Session: 20221212012655
Implantable Pulse Generator Implant Date: 20200217

## 2021-03-01 ENCOUNTER — Ambulatory Visit (INDEPENDENT_AMBULATORY_CARE_PROVIDER_SITE_OTHER): Payer: PPO

## 2021-03-01 DIAGNOSIS — I428 Other cardiomyopathies: Secondary | ICD-10-CM

## 2021-03-10 NOTE — Progress Notes (Signed)
Carelink Summary Report / Loop Recorder 

## 2021-03-17 ENCOUNTER — Other Ambulatory Visit: Payer: Self-pay | Admitting: Family Medicine

## 2021-03-18 NOTE — Telephone Encounter (Signed)
Requested medications are due for refill today.  unsure  Requested medications are on the active medications list.  no  Last refill. 09/09/2020  Future visit scheduled.   yes  Notes to clinic. 5 mg is not on med list.     Requested Prescriptions  Pending Prescriptions Disp Refills   amLODipine (NORVASC) 5 MG tablet [Pharmacy Med Name: AMLODIPINE BESYLATE 5 MG TAB] 90 tablet     Sig: TAKE 1 TABLET BY MOUTH DAILY     Cardiovascular:  Calcium Channel Blockers Failed - 03/17/2021 10:37 AM      Failed - Last BP in normal range    BP Readings from Last 1 Encounters:  01/18/21 (!) 148/73          Passed - Valid encounter within last 6 months    Recent Outpatient Visits           1 month ago Gastroesophageal reflux disease, unspecified whether esophagitis present   [facility] Jerrol Banana., MD   3 months ago Encounter for [employer] annual wellness exam   [facility] Jerrol Banana., MD   4 months ago Annual physical exam   [facility] Jerrol Banana., MD   11 months ago Preoperative general physical examination   [facility] Jerrol Banana., MD   1 year ago Annual physical exam   [facility] Jerrol Banana., MD       Future Appointments             In 1 month Jerrol Banana., MD [facility] [place]

## 2021-04-05 ENCOUNTER — Ambulatory Visit (INDEPENDENT_AMBULATORY_CARE_PROVIDER_SITE_OTHER): Payer: PPO

## 2021-04-05 DIAGNOSIS — I428 Other cardiomyopathies: Secondary | ICD-10-CM | POA: Diagnosis not present

## 2021-04-05 LAB — CUP PACEART REMOTE DEVICE CHECK
Date Time Interrogation Session: 20230122234737
Implantable Pulse Generator Implant Date: 20200217

## 2021-04-08 ENCOUNTER — Other Ambulatory Visit: Payer: Self-pay | Admitting: Family Medicine

## 2021-04-08 NOTE — Telephone Encounter (Signed)
Requested Prescriptions  Pending Prescriptions Disp Refills   pantoprazole (PROTONIX) 40 MG tablet [Pharmacy Med Name: PANTOPRAZOLE SODIUM 40 MG DR TAB] 180 tablet 0    Sig: TAKE 1 TABLET BY MOUTH 2 TIMES DAILY     Gastroenterology: Proton Pump Inhibitors Passed - 04/08/2021  8:32 AM      Passed - Valid encounter within last 12 months    Recent Outpatient Visits          2 months ago Gastroesophageal reflux disease, unspecified whether esophagitis present   [facility] Jerrol Banana., MD   4 months ago Encounter for [employer] annual wellness exam   [facility] Jerrol Banana., MD   5 months ago Annual physical exam   [facility] Jerrol Banana., MD   12 months ago Preoperative general physical examination   [facility] Jerrol Banana., MD   1 year ago Annual physical exam   [facility] Jerrol Banana., MD      Future Appointments            In 3 weeks Jerrol Banana., MD [facility] [place]

## 2021-04-15 NOTE — Progress Notes (Signed)
Carelink Summary Report / Loop Recorder 

## 2021-04-19 DIAGNOSIS — Z96652 Presence of left artificial knee joint: Secondary | ICD-10-CM | POA: Diagnosis not present

## 2021-04-27 NOTE — Progress Notes (Signed)
Established patient visit   Patient: Jeffery Davis   DOB: 02-10-1946   76 y.o. Adult  MRN: [id number] Visit Date: 04/29/2021  Today's healthcare provider: [name], MD   No chief complaint on file.  Subjective    HPI  Patient is doing well.  He had COVID a week ago but is responded nicely and is completely resolved that. Blood pressures at home running 130s over 70s.  Hypertension, follow-up  BP Readings from Last 3 Encounters:  04/29/21 (!) 168/91  01/18/21 (!) 148/73  11/30/20 124/68   Wt Readings from Last 3 Encounters:  04/29/21 173 lb 8 oz (78.7 kg)  01/18/21 175 lb (79.4 kg)  11/30/20 176 lb 9.6 oz (80.1 kg)     He was last seen for hypertension 6 months ago.  BP at that visit was 111/73. Management since that visit includes; taking amlodipine.  He reports good compliance with treatment. He is not having side effects.  He is following a Regular diet. He is exercising. He does not smoke.  Outside blood pressures are checked but not regularly.   Pertinent labs: Lab Results  Component Value Date   CHOL 153 10/28/2020   HDL 49 10/28/2020   LDLCALC 77 10/28/2020   TRIG 159 (H) 10/28/2020   CHOLHDL 3.1 10/28/2020   Lab Results  Component Value Date   NA 141 10/28/2020   K 4.2 10/28/2020   CREATININE 1.00 10/28/2020   EGFR 78 10/28/2020   GLUCOSE 93 10/28/2020   TSH 3.410 10/28/2020     The 10-year ASCVD risk score (Arnett DK, et al., 2019) is: 39.3%   --------------------------------------------------------------------------------------------------- Lipid/Cholesterol, Follow-up  Last lipid panel Other pertinent labs  Lab Results  Component Value Date   CHOL 153 10/28/2020   HDL 49 10/28/2020   LDLCALC 77 10/28/2020   TRIG 159 (H) 10/28/2020   CHOLHDL 3.1 10/28/2020   Lab Results  Component Value Date   ALT 17 10/28/2020   AST 30 10/28/2020   PLT 284 10/28/2020   TSH 3.410 10/28/2020     He was last seen for this 6 months  ago.  Management since that visit includes; labs ordered.  He reports good compliance with treatment. He is not having side effects. none  Current diet: in general, a "healthy" diet   Current exercise: walking  The 10-year ASCVD risk score (Arnett DK, et al., 2019) is: 39.3%  ---------------------------------------------------------------------------------------------------     Medications: Outpatient Medications Prior to Visit  Medication Sig   amLODipine (NORVASC) 2.5 MG tablet Take 1 tablet by mouth daily.   amLODipine (NORVASC) 5 MG tablet TAKE 1 TABLET BY MOUTH DAILY   Cholecalciferol (VITAMIN D3) 5000 units TABS Take 5,000 Units by mouth daily.   Coenzyme Q10 (CO Q 10) 100 MG CAPS Take 100 mg by mouth 2 (two) times daily.    Magnesium 250 MG TABS Take 250 mg by mouth 2 (two) times daily.   Misc Natural Products (GLUCOSAMINE CHONDROITIN TRIPLE) TABS Take 1 tablet by mouth 2 (two) times daily.   pantoprazole (PROTONIX) 40 MG tablet TAKE 1 TABLET BY MOUTH 2 TIMES DAILY   rosuvastatin (CRESTOR) 10 MG tablet TAKE 1 TABLET BY MOUTH DAILY   tadalafil (CIALIS) 20 MG tablet Take 1 tablet (20 mg total) by mouth every three (3) days as needed for erectile dysfunction.   amoxicillin-clavulanate (AUGMENTIN) 875-125 MG tablet Take 1 tablet by mouth 2 (two) times daily. (Patient not taking: Reported on 04/29/2021)  telmisartan (MICARDIS) 20 MG tablet Take 20 mg by mouth daily.    No facility-administered medications prior to visit.    Review of Systems  All other systems reviewed and are negative.      Objective    BP (!) 168/91 (BP Location: Left Arm, Patient Position: Sitting, Cuff Size: Normal)    Pulse (!) 59    Temp 98.3 F (36.8 C) (Temporal)    Resp 16    Wt 173 lb 8 oz (78.7 kg)    SpO2 99%    BMI 26.38 kg/m  BP Readings from Last 3 Encounters:  04/29/21 (!) 168/91  01/18/21 (!) 148/73  11/30/20 124/68   Wt Readings from Last 3 Encounters:  04/29/21 173 lb 8 oz (78.7  kg)  01/18/21 175 lb (79.4 kg)  11/30/20 176 lb 9.6 oz (80.1 kg)      Physical Exam Vitals and nursing note reviewed.  Constitutional:      Appearance: Normal appearance. He is normal weight.  HENT:     Head: Normocephalic and atraumatic.     Right Ear: External ear normal.     Left Ear: External ear normal.  Eyes:     General: No scleral icterus.    Conjunctiva/sclera: Conjunctivae normal.  Cardiovascular:     Rate and Rhythm: Normal rate and regular rhythm.     Pulses: Normal pulses.     Heart sounds: Normal heart sounds.  Pulmonary:     Effort: Pulmonary effort is normal.     Breath sounds: Normal breath sounds.  Abdominal:     Palpations: Abdomen is soft.  Skin:    General: Skin is warm and dry.  Neurological:     General: No focal deficit present.     Mental Status: He is alert and oriented to person, place, and time.  Psychiatric:        Mood and Affect: Mood normal.        Behavior: Behavior normal.        Thought Content: Thought content normal.        Judgment: Judgment normal.      No results found for any visits on 04/29/21.  Assessment & Plan     1. Essential hypertension Excellent control with home blood pressure readings 130s over 70s. Some whitecoat hypertension  2. Hypercholesteremia Treated  3. Paroxysmal atrial fibrillation ([place]) Followed by cardiology  4. COVID-19 COVID-19 symptoms have completely resolved from a week ago.   No follow-ups on file.      I, [name], MD, have reviewed all documentation for this visit. The documentation on 05/04/21 for the exam, diagnosis, procedures, and orders are all accurate and complete.    Saanvika Vazques Cranford Mon, MD  [facility] [phone] (phone) [phone] (fax)  [place]

## 2021-04-29 ENCOUNTER — Encounter: Payer: Self-pay | Admitting: Family Medicine

## 2021-04-29 ENCOUNTER — Other Ambulatory Visit: Payer: Self-pay

## 2021-04-29 ENCOUNTER — Ambulatory Visit (INDEPENDENT_AMBULATORY_CARE_PROVIDER_SITE_OTHER): Payer: PPO | Admitting: Family Medicine

## 2021-04-29 VITALS — BP 168/91 | HR 59 | Temp 98.3°F | Resp 16 | Wt 173.5 lb

## 2021-04-29 DIAGNOSIS — I480 Paroxysmal atrial fibrillation: Secondary | ICD-10-CM

## 2021-04-29 DIAGNOSIS — E7800 Pure hypercholesterolemia, unspecified: Secondary | ICD-10-CM

## 2021-04-29 DIAGNOSIS — I10 Essential (primary) hypertension: Secondary | ICD-10-CM | POA: Diagnosis not present

## 2021-04-29 DIAGNOSIS — U071 COVID-19: Secondary | ICD-10-CM

## 2021-05-07 DIAGNOSIS — Z981 Arthrodesis status: Secondary | ICD-10-CM | POA: Diagnosis not present

## 2021-05-10 ENCOUNTER — Ambulatory Visit (INDEPENDENT_AMBULATORY_CARE_PROVIDER_SITE_OTHER): Payer: PPO

## 2021-05-10 DIAGNOSIS — I428 Other cardiomyopathies: Secondary | ICD-10-CM | POA: Diagnosis not present

## 2021-05-10 LAB — CUP PACEART REMOTE DEVICE CHECK
Date Time Interrogation Session: 20230224234742
Implantable Pulse Generator Implant Date: 20200217

## 2021-05-12 NOTE — Progress Notes (Signed)
Carelink Summary Report / Loop Recorder 

## 2021-05-13 ENCOUNTER — Other Ambulatory Visit: Payer: Self-pay | Admitting: Family Medicine

## 2021-05-13 NOTE — Telephone Encounter (Signed)
Sent via Interface, requested early. LRF 04/08/21 180  0 refills ? ?Requested Prescriptions  ?Pending Prescriptions Disp Refills  ?? pantoprazole (PROTONIX) 40 MG tablet [Pharmacy Med Name: PANTOPRAZOLE SODIUM 40 MG DR TAB] 180 tablet 0  ?  Sig: TAKE 1 TABLET BY MOUTH 2 TIMES DAILY  ?  ? Gastroenterology: Proton Pump Inhibitors Passed - 05/13/2021  9:41 AM  ?  ?  Passed - Valid encounter within last 12 months  ?  Recent Outpatient Visits   ?      ? 2 weeks ago Essential hypertension  ? [facility] Jerrol Banana., MD  ? 3 months ago Gastroesophageal reflux disease, unspecified whether esophagitis present  ? [facility] Jerrol Banana., MD  ? 5 months ago Encounter for [employer] annual wellness exam  ? [facility] Jerrol Banana., MD  ? 6 months ago Annual physical exam  ? [facility] Jerrol Banana., MD  ? 1 year ago Preoperative general physical examination  ? [facility] Jerrol Banana., MD  ?  ?  ?Future Appointments   ?        ? In 5 months Jerrol Banana., MD [facility]  ?  ? ?  ?  ?  ? ?

## 2021-06-07 DIAGNOSIS — L821 Other seborrheic keratosis: Secondary | ICD-10-CM | POA: Diagnosis not present

## 2021-06-12 LAB — CUP PACEART REMOTE DEVICE CHECK
Date Time Interrogation Session: 20230401000642
Implantable Pulse Generator Implant Date: 20200217

## 2021-06-14 ENCOUNTER — Ambulatory Visit (INDEPENDENT_AMBULATORY_CARE_PROVIDER_SITE_OTHER): Payer: PPO

## 2021-06-14 DIAGNOSIS — I428 Other cardiomyopathies: Secondary | ICD-10-CM

## 2021-06-15 DIAGNOSIS — N62 Hypertrophy of breast: Secondary | ICD-10-CM | POA: Diagnosis not present

## 2021-06-24 NOTE — Progress Notes (Signed)
Carelink Summary Report / Loop Recorder 

## 2021-07-01 ENCOUNTER — Ambulatory Visit (INDEPENDENT_AMBULATORY_CARE_PROVIDER_SITE_OTHER): Payer: PPO | Admitting: Family Medicine

## 2021-07-01 ENCOUNTER — Encounter: Payer: Self-pay | Admitting: Family Medicine

## 2021-07-01 VITALS — BP 111/67 | HR 66 | Temp 97.8°F | Resp 16 | Wt 175.0 lb

## 2021-07-01 DIAGNOSIS — K30 Functional dyspepsia: Secondary | ICD-10-CM | POA: Diagnosis not present

## 2021-07-01 DIAGNOSIS — E7800 Pure hypercholesterolemia, unspecified: Secondary | ICD-10-CM | POA: Diagnosis not present

## 2021-07-01 DIAGNOSIS — I4819 Other persistent atrial fibrillation: Secondary | ICD-10-CM | POA: Diagnosis not present

## 2021-07-01 DIAGNOSIS — R197 Diarrhea, unspecified: Secondary | ICD-10-CM

## 2021-07-01 DIAGNOSIS — I480 Paroxysmal atrial fibrillation: Secondary | ICD-10-CM

## 2021-07-01 DIAGNOSIS — I10 Essential (primary) hypertension: Secondary | ICD-10-CM | POA: Diagnosis not present

## 2021-07-01 NOTE — Progress Notes (Signed)
?  ? ? ?Established patient visit ? ?I,[name],acting as a scribe for [name], MD.,have documented all relevant documentation on the behalf of [name], MD,as directed by  [name], MD while in the presence of [name], MD. ? ? ?Patient: Jeffery Davis   DOB: 01-06-1946   76 y.o. Adult  MRN: [id number] ?Visit Date: 07/01/2021 ? ?Today's healthcare provider: [name], MD  ? ?Chief Complaint  ?Patient presents with  ? Diarrhea  ? ?Subjective  ?  ?Diarrhea  ?This is a new problem. The current episode started more than 1 month ago (2 month ago). The problem has been unchanged. The stool consistency is described as Watery. The patient states that diarrhea does not awaken him from sleep. Pertinent negatives include no abdominal pain, arthralgias, bloating, chills, coughing, fever, headaches, increased  flatus, myalgias, sweats, URI, vomiting or weight loss. Exacerbated by: breakfast. There are no known risk factors. He has tried nothing for the symptoms. The treatment provided no relief.   ? ?Patient state he has had runny stools for around 2 months. Patient states change in bowel movements started after he had covid in February this year. Patient stated his stomach is more upset in the mornings, usually after breakfast. ?He had COVID in February and he has had GI problems recently.  He required no treatment for his COVID. ? ?Medications: ?Outpatient Medications Prior to Visit  ?Medication Sig  ? amLODipine (NORVASC) 2.5 MG tablet Take 1 tablet by mouth daily.  ? Cholecalciferol (VITAMIN D3) 5000 units TABS Take 5,000 Units by mouth daily.  ? Coenzyme Q10 (CO Q 10) 100 MG CAPS Take 100 mg by mouth 2 (two) times daily.   ? Magnesium 250 MG TABS Take 250 mg by mouth 2 (two) times daily.  ? Misc Natural Products (GLUCOSAMINE CHONDROITIN TRIPLE) TABS Take 1 tablet by mouth 2 (two) times daily.  ? pantoprazole (PROTONIX) 40 MG tablet TAKE 1 TABLET BY MOUTH 2 TIMES DAILY  ?  rosuvastatin (CRESTOR) 10 MG tablet TAKE 1 TABLET BY MOUTH DAILY  ? tadalafil (CIALIS) 20 MG tablet Take 1 tablet (20 mg total) by mouth every three (3) days as needed for erectile dysfunction.  ? telmisartan (MICARDIS) 20 MG tablet Take 20 mg by mouth daily.   ? [DISCONTINUED] amLODipine (NORVASC) 5 MG tablet TAKE 1 TABLET BY MOUTH DAILY  ? [DISCONTINUED] amoxicillin-clavulanate (AUGMENTIN) 875-125 MG tablet Take 1 tablet by mouth 2 (two) times daily. (Patient not taking: Reported on 04/29/2021)  ? ?No facility-administered medications prior to visit.  ? ? ?Review of Systems  ?Constitutional:  Negative for chills, fever and weight loss.  ?Respiratory:  Negative for cough.   ?Gastrointestinal:  Positive for diarrhea. Negative for abdominal pain, bloating, flatus and vomiting.  ?Musculoskeletal:  Negative for arthralgias and myalgias.  ?Neurological:  Negative for headaches.  ? ?Last lipids ?Lab Results  ?Component Value Date  ? CHOL 153 10/28/2020  ? HDL 49 10/28/2020  ? [place] 77 10/28/2020  ? TRIG 159 (H) 10/28/2020  ? CHOLHDL 3.1 10/28/2020  ? ?  ?  Objective  ?  ?BP 111/67 (BP Location: Left Arm, Patient Position: Sitting, Cuff Size: Normal)   Pulse 66   Temp 97.8 ?F (36.6 ?C) (Temporal)   Resp 16   Wt 175 lb (79.4 kg)   SpO2 97%   BMI 26.61 kg/m?  ?BP Readings from Last 3 Encounters:  ?07/01/21 111/67  ?04/29/21 (!) 168/91  ?01/18/21 (!) 148/73  ? ?  Wt Readings from Last 3 Encounters:  ?07/01/21 175 lb (79.4 kg)  ?04/29/21 173 lb 8 oz (78.7 kg)  ?01/18/21 175 lb (79.4 kg)  ? ?  ? ?Physical Exam ?Constitutional:   ?   General: He is not in acute distress. ?   Appearance: He is well-developed.  ?HENT:  ?   Head: Normocephalic and atraumatic.  ?   Right Ear: Hearing normal.  ?   Left Ear: Hearing normal.  ?   Nose: Nose normal.  ?Eyes:  ?   General: Lids are normal. No scleral icterus.    ?   Right eye: No discharge.     ?   Left eye: No discharge.  ?   Conjunctiva/sclera: Conjunctivae normal.   ?Cardiovascular:  ?   Rate and Rhythm: Normal rate and regular rhythm.  ?   Heart sounds: Normal heart sounds.  ?Pulmonary:  ?   Effort: Pulmonary effort is normal. No respiratory distress.  ?Abdominal:  ?   Palpations: Abdomen is soft.  ?Skin: ?   Findings: No lesion or rash.  ?Neurological:  ?   General: No focal deficit present.  ?   Mental Status: He is alert and oriented to person, place, and time.  ?Psychiatric:     ?   Mood and Affect: Mood normal.     ?   Speech: Speech normal.     ?   Behavior: Behavior normal.     ?   Thought Content: Thought content normal.     ?   Judgment: Judgment normal.  ?  ? ? ?No results found for any visits on 07/01/21. ? Assessment & Plan  ?  ? ?1. Diarrhea, unspecified type ?Small change in bowel movements recently with urgency.  At this time decrease his pantoprazole to once a day and try Metamucil daily and over-the-counter probiotic.  May need GI referral. ?- CBC w/Diff/Platelet ?- Comprehensive Metabolic Panel (CMET) ?- Sed Rate (ESR) ? ?2. Essential hypertension ?Good blood pressure control ?- CBC w/Diff/Platelet ?- Comprehensive Metabolic Panel (CMET) ?- Sed Rate (ESR) ? ?3. Hypercholesteremia ?On rosuvastatin 10 ? ?4. Paroxysmal atrial fibrillation ([facility]) ? ? ?5. Acid indigestion ?Decrease Protonix to once a day ? ? ? ? ?No follow-ups on file.  ?   ? ?I, [name], MD, have reviewed all documentation for this visit. The documentation on 07/08/21 for the exam, diagnosis, procedures, and orders are all accurate and complete. ? ? ? ?Perry Molla Cranford Mon, MD  ?[facility] ?[phone] (phone) ?[phone] (fax) ? ?[facility] ?

## 2021-07-01 NOTE — Patient Instructions (Signed)
DECREASE PANTOPRAZOLE TO ONCE A DAY. TRY OVER-THE-COUNTER METAMUCIL AND PROBIOTIC DAILY. ?

## 2021-07-02 LAB — CBC WITH DIFFERENTIAL/PLATELET
Basophils Absolute: 0.1 10*3/uL (ref 0.0–0.2)
Basos: 1 %
EOS (ABSOLUTE): 0.1 10*3/uL (ref 0.0–0.4)
Eos: 2 %
Hematocrit: 36.3 % — ABNORMAL LOW (ref 37.5–51.0)
Hemoglobin: 12.1 g/dL — ABNORMAL LOW (ref 13.0–17.7)
Immature Grans (Abs): 0 10*3/uL (ref 0.0–0.1)
Immature Granulocytes: 0 %
Lymphocytes Absolute: 2 10*3/uL (ref 0.7–3.1)
Lymphs: 32 %
MCH: 30.3 pg (ref 26.6–33.0)
MCHC: 33.3 g/dL (ref 31.5–35.7)
MCV: 91 fL (ref 79–97)
Monocytes Absolute: 0.7 10*3/uL (ref 0.1–0.9)
Monocytes: 11 %
Neutrophils Absolute: 3.3 10*3/uL (ref 1.4–7.0)
Neutrophils: 54 %
Platelets: 314 10*3/uL (ref 150–450)
RBC: 3.99 x10E6/uL — ABNORMAL LOW (ref 4.14–5.80)
RDW: 14.6 % (ref 11.6–15.4)
WBC: 6.1 10*3/uL (ref 3.4–10.8)

## 2021-07-02 LAB — COMPREHENSIVE METABOLIC PANEL
ALT: 16 IU/L (ref 0–44)
AST: 26 IU/L (ref 0–40)
Albumin/Globulin Ratio: 2 (ref 1.2–2.2)
Albumin: 4.5 g/dL (ref 3.7–4.7)
Alkaline Phosphatase: 71 IU/L (ref 44–121)
BUN/Creatinine Ratio: 24 (ref 10–24)
BUN: 26 mg/dL (ref 8–27)
Bilirubin Total: 0.3 mg/dL (ref 0.0–1.2)
CO2: 23 mmol/L (ref 20–29)
Calcium: 8.8 mg/dL (ref 8.6–10.2)
Chloride: 106 mmol/L (ref 96–106)
Creatinine, Ser: 1.09 mg/dL (ref 0.76–1.27)
Globulin, Total: 2.2 g/dL (ref 1.5–4.5)
Glucose: 93 mg/dL (ref 70–99)
Potassium: 4.1 mmol/L (ref 3.5–5.2)
Sodium: 143 mmol/L (ref 134–144)
Total Protein: 6.7 g/dL (ref 6.0–8.5)
eGFR: 71 mL/min/{1.73_m2} (ref >59)

## 2021-07-02 LAB — SEDIMENTATION RATE: Sed Rate: 10 mm/hr (ref 0–30)

## 2021-07-09 ENCOUNTER — Other Ambulatory Visit: Payer: Self-pay | Admitting: Family Medicine

## 2021-07-09 DIAGNOSIS — E7800 Pure hypercholesterolemia, unspecified: Secondary | ICD-10-CM

## 2021-07-19 ENCOUNTER — Ambulatory Visit (INDEPENDENT_AMBULATORY_CARE_PROVIDER_SITE_OTHER): Payer: PPO

## 2021-07-19 DIAGNOSIS — I428 Other cardiomyopathies: Secondary | ICD-10-CM

## 2021-07-19 LAB — CUP PACEART REMOTE DEVICE CHECK
Date Time Interrogation Session: 20230507231950
Implantable Pulse Generator Implant Date: 20200217

## 2021-08-03 NOTE — Progress Notes (Signed)
Carelink Summary Report / Loop Recorder 

## 2021-08-12 DIAGNOSIS — L570 Actinic keratosis: Secondary | ICD-10-CM | POA: Diagnosis not present

## 2021-08-12 DIAGNOSIS — D225 Melanocytic nevi of trunk: Secondary | ICD-10-CM | POA: Diagnosis not present

## 2021-08-12 DIAGNOSIS — Z85828 Personal history of other malignant neoplasm of skin: Secondary | ICD-10-CM | POA: Diagnosis not present

## 2021-08-12 DIAGNOSIS — D2271 Melanocytic nevi of right lower limb, including hip: Secondary | ICD-10-CM | POA: Diagnosis not present

## 2021-08-12 DIAGNOSIS — D2261 Melanocytic nevi of right upper limb, including shoulder: Secondary | ICD-10-CM | POA: Diagnosis not present

## 2021-08-12 DIAGNOSIS — D2262 Melanocytic nevi of left upper limb, including shoulder: Secondary | ICD-10-CM | POA: Diagnosis not present

## 2021-08-23 ENCOUNTER — Ambulatory Visit (INDEPENDENT_AMBULATORY_CARE_PROVIDER_SITE_OTHER): Payer: PPO

## 2021-08-23 DIAGNOSIS — I4819 Other persistent atrial fibrillation: Secondary | ICD-10-CM | POA: Diagnosis not present

## 2021-08-23 LAB — CUP PACEART REMOTE DEVICE CHECK
Date Time Interrogation Session: 20230609231938
Implantable Pulse Generator Implant Date: 20200217

## 2021-08-24 ENCOUNTER — Other Ambulatory Visit: Payer: Self-pay | Admitting: Family Medicine

## 2021-09-16 NOTE — Progress Notes (Signed)
Carelink Summary Report / Loop Recorder 

## 2021-09-23 LAB — CUP PACEART REMOTE DEVICE CHECK
Date Time Interrogation Session: 20230712232354
Implantable Pulse Generator Implant Date: 20200217

## 2021-09-27 ENCOUNTER — Ambulatory Visit (INDEPENDENT_AMBULATORY_CARE_PROVIDER_SITE_OTHER): Payer: PPO

## 2021-09-27 DIAGNOSIS — I4819 Other persistent atrial fibrillation: Secondary | ICD-10-CM

## 2021-10-14 DIAGNOSIS — I428 Other cardiomyopathies: Secondary | ICD-10-CM | POA: Diagnosis not present

## 2021-10-14 DIAGNOSIS — I480 Paroxysmal atrial fibrillation: Secondary | ICD-10-CM | POA: Diagnosis not present

## 2021-10-14 DIAGNOSIS — I10 Essential (primary) hypertension: Secondary | ICD-10-CM | POA: Diagnosis not present

## 2021-10-27 NOTE — Progress Notes (Signed)
Annual Wellness Visit     Patient: Jeffery Davis, Adult    DOB: 10-25-1945, 76 y.o.   MRN: [id number] Visit Date: 10/28/2021  Today's Provider: [name], MD   No chief complaint on file.  Subjective    Jeffery Davis is a 76 y.o. adult who presents today for his Annual Wellness Visit.  Annual physical. Patient feels well and has no complaints.  HPI    Medications: Outpatient Medications Prior to Visit  Medication Sig   amLODipine (NORVASC) 2.5 MG tablet Take 1 tablet by mouth daily.   Cholecalciferol (VITAMIN D3) 5000 units TABS Take 5,000 Units by mouth daily.   Coenzyme Q10 (CO Q 10) 100 MG CAPS Take 100 mg by mouth 2 (two) times daily.    Magnesium 250 MG TABS Take 250 mg by mouth 2 (two) times daily.   Misc Natural Products (GLUCOSAMINE CHONDROITIN TRIPLE) TABS Take 1 tablet by mouth 2 (two) times daily.   pantoprazole (PROTONIX) 40 MG tablet TAKE 1 TABLET BY MOUTH 2 TIMES DAILY   rosuvastatin (CRESTOR) 10 MG tablet TAKE 1 TABLET BY MOUTH DAILY   tadalafil (CIALIS) 20 MG tablet Take 1 tablet (20 mg total) by mouth every three (3) days as needed for erectile dysfunction.   telmisartan (MICARDIS) 20 MG tablet Take 20 mg by mouth daily.    No facility-administered medications prior to visit.    No Known Allergies  Patient Care Team: Jerrol Banana., MD as PCP - General (Family Medicine) Idelle Leech, [place] as Consulting Physician (Optometry) Ralene Bathe, MD as Consulting Physician (Dermatology) Ubaldo Glassing Javier Docker, MD as Consulting Physician (Cardiology) Thompson Grayer, MD as Consulting Physician (Cardiology) Newman Pies, MD as Consulting Physician (Neurosurgery) Marchia Bond, MD as Consulting Physician (Orthopedic Surgery) Thornton Park, MD as Referring Physician (Orthopedic Surgery)  Review of Systems  Constitutional: Negative.   HENT: Negative.    Eyes: Negative.   Respiratory: Negative.    Cardiovascular: Negative.    Gastrointestinal: Negative.   Endocrine: Negative.   Genitourinary: Negative.   Musculoskeletal: Negative.   Skin: Negative.   Allergic/Immunologic: Negative.   Neurological:  Positive for light-headedness.  Hematological: Negative.   Psychiatric/Behavioral: Negative.      Last lipids Lab Results  Component Value Date   CHOL 143 10/28/2021   HDL 44 10/28/2021   LDLCALC 73 10/28/2021   TRIG 152 (H) 10/28/2021   CHOLHDL 3.1 10/28/2020        Objective    Vitals: BP 123/80 (BP Location: Right Arm, Patient Position: Sitting, Cuff Size: Normal)   Pulse 69   Temp (!) 97 F (36.1 C) (Oral)   Wt 175 lb (79.4 kg)   SpO2 98%   BMI 26.61 kg/m  BP Readings from Last 3 Encounters:  10/28/21 123/80  07/01/21 111/67  04/29/21 (!) 168/91   Wt Readings from Last 3 Encounters:  10/28/21 175 lb (79.4 kg)  07/01/21 175 lb (79.4 kg)  04/29/21 173 lb 8 oz (78.7 kg)       Physical Exam Constitutional:      Appearance: Normal appearance. He is normal weight.  HENT:     Head: Normocephalic and atraumatic.     Right Ear: Tympanic membrane, ear canal and external ear normal.     Left Ear: Tympanic membrane, ear canal and external ear normal.     Nose: Nose normal.     Mouth/Throat:     Mouth: Mucous membranes are moist.  Pharynx: Oropharynx is clear.  Eyes:     Extraocular Movements: Extraocular movements intact.     Conjunctiva/sclera: Conjunctivae normal.     Pupils: Pupils are equal, round, and reactive to light.  Cardiovascular:     Rate and Rhythm: Normal rate and regular rhythm.     Pulses: Normal pulses.     Heart sounds: Normal heart sounds.  Pulmonary:     Effort: Pulmonary effort is normal.     Breath sounds: Normal breath sounds.  Abdominal:     General: Abdomen is flat. Bowel sounds are normal.     Palpations: Abdomen is soft.  Musculoskeletal:     Cervical back: Normal range of motion and neck supple.  Skin:    General: Skin is warm and dry.   Neurological:     General: No focal deficit present.     Mental Status: He is alert and oriented to person, place, and time. Mental status is at baseline.  Psychiatric:        Mood and Affect: Mood normal.        Behavior: Behavior normal.        Thought Content: Thought content normal.        Judgment: Judgment normal.      Most recent functional status assessment:    10/28/2021   10:31 AM  In your present state of health, do you have any difficulty performing the following activities:  Hearing? 1  Vision? 0  Difficulty concentrating or making decisions? 0  Walking or climbing stairs? 0  Dressing or bathing? 0  Doing errands, shopping? 0   Most recent fall risk assessment:    10/28/2021   10:30 AM  Fall Risk   Falls in the past year? 0    Most recent depression screenings:    10/28/2021   10:30 AM 04/29/2021    8:41 AM  PHQ 2/9 Scores  PHQ - 2 Score 0 0  PHQ- 9 Score 0 2   Most recent cognitive screening:    10/09/2019    8:59 AM  6CIT Screen  What Year? 0 points  What month? 0 points  What time? 0 points  Count back from 20 0 points  Months in reverse 0 points  Repeat phrase 0 points  Total Score 0 points   Most recent Audit-C alcohol use screening    10/28/2021   10:30 AM  Alcohol Use Disorder Test (AUDIT)  1. How often do you have a drink containing alcohol? 4  2. How many drinks containing alcohol do you have on a typical day when you are drinking? 0  3. How often do you have six or more drinks on one occasion? 0  AUDIT-C Score 4   A score of 3 or more in women, and 4 or more in men indicates increased risk for alcohol abuse, EXCEPT if all of the points are from question 1   No results found for any visits on 10/28/21.  Assessment & Plan     Annual wellness visit done today including the all of the following: Reviewed patient's Family Medical History Reviewed and updated list of patient's medical providers Assessment of cognitive impairment was  done Assessed patient's functional ability Established a written schedule for health screening [place] Completed and Reviewed  Exercise Activities and Dietary recommendations  Goals      DIET - REDUCE PORTION SIZE     Recommend decreasing portion sizes by half and eating 3 small meals a  day with two healthy snacks in between.      Reduce salt intake to 2 grams per day or less     Reduce salt intake to 2 grams per day or less        Immunization History  Administered Date(s) Administered   Fluad Quad(high Dose 65+) 12/05/2018, 01/18/2021   Influenza, High Dose Seasonal PF 01/08/2015, 12/08/2015, 12/30/2016, 12/04/2019   Influenza,inj,Quad PF,6+ Mos 01/07/2014   Influenza-Unspecified 12/31/2017   Pneumococcal Conjugate-13 11/10/2014   Pneumococcal Polysaccharide-23 10/06/2011   Td 12/22/2003   Tdap 10/06/2011   Zoster, Live 03/14/2010    Health Maintenance  Topic Date Due   COVID-19 Vaccine (1) Never done   Zoster Vaccines- Shingrix (1 of 2) Never done   COLONOSCOPY (Pts 45-65yr Insurance coverage will need to be confirmed)  05/03/2020   TETANUS/TDAP  10/05/2021   INFLUENZA VACCINE  10/12/2021   Pneumonia Vaccine 76 Years old  Completed   Hepatitis C Screening  Completed   HPV VACCINES  Aged Out     Discussed health benefits of physical activity, and encouraged him to engage in regular exercise appropriate for his age and condition.    1. Medicare annual wellness visit, subsequent Next year we will discuss  2. Annual physical exam Patient up-to-date on screenings - CBC with Differential/Platelet - Comprehensive metabolic panel - Lipid Panel With LDL/HDL Ratio - TSH  3. Hypercholesteremia  - rosuvastatin (CRESTOR) 10 MG tablet; Take 1 tablet (10 mg total) by mouth daily.  Dispense: 90 tablet; Refill: 1  4. Persistent atrial fibrillation ([place]   5. Acid indigestion    No follow-ups on file.     I, RWilhemena Durie MD, have  reviewed all documentation for this visit. The documentation on 11/01/21 for the exam, diagnosis, procedures, and orders are all accurate and complete.    Malaina Mortellaro GCranford Mon MD  [phone](phone) [phone](fax)  [place]

## 2021-10-28 ENCOUNTER — Ambulatory Visit (INDEPENDENT_AMBULATORY_CARE_PROVIDER_SITE_OTHER): Payer: PPO | Admitting: Family Medicine

## 2021-10-28 VITALS — BP 123/80 | HR 69 | Temp 97.0°F | Wt 175.0 lb

## 2021-10-28 DIAGNOSIS — I4819 Other persistent atrial fibrillation: Secondary | ICD-10-CM | POA: Diagnosis not present

## 2021-10-28 DIAGNOSIS — K30 Functional dyspepsia: Secondary | ICD-10-CM

## 2021-10-28 DIAGNOSIS — E7800 Pure hypercholesterolemia, unspecified: Secondary | ICD-10-CM

## 2021-10-28 DIAGNOSIS — Z0000 Encounter for general adult medical examination without abnormal findings: Secondary | ICD-10-CM

## 2021-10-28 LAB — CUP PACEART REMOTE DEVICE CHECK
Date Time Interrogation Session: 20230814232757
Implantable Pulse Generator Implant Date: 20200217

## 2021-10-28 MED ORDER — ROSUVASTATIN CALCIUM 10 MG PO TABS
10.0000 mg | ORAL_TABLET | Freq: Every day | ORAL | 1 refills | Status: AC
Start: 2021-10-28 — End: ?

## 2021-10-29 LAB — CBC WITH DIFFERENTIAL/PLATELET
Basophils Absolute: 0 10*3/uL (ref 0.0–0.2)
Basos: 1 %
EOS (ABSOLUTE): 0.2 10*3/uL (ref 0.0–0.4)
Eos: 3 %
Hematocrit: 39.5 % (ref 37.5–51.0)
Hemoglobin: 13.3 g/dL (ref 13.0–17.7)
Immature Grans (Abs): 0 10*3/uL (ref 0.0–0.1)
Immature Granulocytes: 0 %
Lymphocytes Absolute: 1.7 10*3/uL (ref 0.7–3.1)
Lymphs: 35 %
MCH: 32.2 pg (ref 26.6–33.0)
MCHC: 33.7 g/dL (ref 31.5–35.7)
MCV: 96 fL (ref 79–97)
Monocytes Absolute: 0.6 10*3/uL (ref 0.1–0.9)
Monocytes: 13 %
Neutrophils Absolute: 2.3 10*3/uL (ref 1.4–7.0)
Neutrophils: 48 %
Platelets: 262 10*3/uL (ref 150–450)
RBC: 4.13 x10E6/uL — ABNORMAL LOW (ref 4.14–5.80)
RDW: 15.2 % (ref 11.6–15.4)
WBC: 4.8 10*3/uL (ref 3.4–10.8)

## 2021-10-29 LAB — COMPREHENSIVE METABOLIC PANEL
ALT: 33 IU/L (ref 0–44)
AST: 35 IU/L (ref 0–40)
Albumin/Globulin Ratio: 2.1 (ref 1.2–2.2)
Albumin: 4.6 g/dL (ref 3.8–4.8)
Alkaline Phosphatase: 74 IU/L (ref 44–121)
BUN/Creatinine Ratio: 23 (ref 10–24)
BUN: 24 mg/dL (ref 8–27)
Bilirubin Total: 0.5 mg/dL (ref 0.0–1.2)
CO2: 23 mmol/L (ref 20–29)
Calcium: 9.3 mg/dL (ref 8.6–10.2)
Chloride: 102 mmol/L (ref 96–106)
Creatinine, Ser: 1.03 mg/dL (ref 0.76–1.27)
Globulin, Total: 2.2 g/dL (ref 1.5–4.5)
Glucose: 88 mg/dL (ref 70–99)
Potassium: 4.5 mmol/L (ref 3.5–5.2)
Sodium: 140 mmol/L (ref 134–144)
Total Protein: 6.8 g/dL (ref 6.0–8.5)
eGFR: 75 mL/min/{1.73_m2} (ref >59)

## 2021-10-29 LAB — LIPID PANEL WITH LDL/HDL RATIO
Cholesterol, Total: 143 mg/dL (ref 100–199)
HDL: 44 mg/dL (ref >39)
LDL Chol Calc (NIH): 73 mg/dL (ref 0–99)
LDL/HDL Ratio: 1.7 ratio (ref 0.0–3.6)
Triglycerides: 152 mg/dL — ABNORMAL HIGH (ref 0–149)
VLDL Cholesterol Cal: 26 mg/dL (ref 5–40)

## 2021-10-29 LAB — TSH: TSH: 2.12 u[IU]/mL (ref 0.450–4.500)

## 2021-11-01 ENCOUNTER — Ambulatory Visit (INDEPENDENT_AMBULATORY_CARE_PROVIDER_SITE_OTHER): Payer: PPO

## 2021-11-01 DIAGNOSIS — I4819 Other persistent atrial fibrillation: Secondary | ICD-10-CM

## 2021-11-01 DIAGNOSIS — D0321 Melanoma in situ of right ear and external auricular canal: Secondary | ICD-10-CM | POA: Diagnosis not present

## 2021-11-01 DIAGNOSIS — D485 Neoplasm of uncertain behavior of skin: Secondary | ICD-10-CM | POA: Diagnosis not present

## 2021-11-01 DIAGNOSIS — L570 Actinic keratosis: Secondary | ICD-10-CM | POA: Diagnosis not present

## 2021-11-02 NOTE — Progress Notes (Signed)
Carelink Summary Report / Loop Recorder 

## 2021-11-08 DIAGNOSIS — Z9849 Cataract extraction status, unspecified eye: Secondary | ICD-10-CM | POA: Diagnosis not present

## 2021-11-08 DIAGNOSIS — H9190 Unspecified hearing loss, unspecified ear: Secondary | ICD-10-CM | POA: Diagnosis not present

## 2021-11-08 DIAGNOSIS — K219 Gastro-esophageal reflux disease without esophagitis: Secondary | ICD-10-CM | POA: Diagnosis not present

## 2021-11-08 DIAGNOSIS — I10 Essential (primary) hypertension: Secondary | ICD-10-CM | POA: Diagnosis not present

## 2021-11-08 DIAGNOSIS — M5136 Other intervertebral disc degeneration, lumbar region: Secondary | ICD-10-CM | POA: Diagnosis not present

## 2021-11-08 DIAGNOSIS — I2510 Atherosclerotic heart disease of native coronary artery without angina pectoris: Secondary | ICD-10-CM | POA: Diagnosis not present

## 2021-11-08 DIAGNOSIS — D692 Other nonthrombocytopenic purpura: Secondary | ICD-10-CM | POA: Diagnosis not present

## 2021-11-08 DIAGNOSIS — E663 Overweight: Secondary | ICD-10-CM | POA: Diagnosis not present

## 2021-11-08 DIAGNOSIS — M1990 Unspecified osteoarthritis, unspecified site: Secondary | ICD-10-CM | POA: Diagnosis not present

## 2021-11-08 DIAGNOSIS — I429 Cardiomyopathy, unspecified: Secondary | ICD-10-CM | POA: Diagnosis not present

## 2021-11-08 DIAGNOSIS — E785 Hyperlipidemia, unspecified: Secondary | ICD-10-CM | POA: Diagnosis not present

## 2021-11-08 DIAGNOSIS — Z8673 Personal history of transient ischemic attack (TIA), and cerebral infarction without residual deficits: Secondary | ICD-10-CM | POA: Diagnosis not present

## 2021-11-25 ENCOUNTER — Encounter (HOSPITAL_COMMUNITY): Payer: Self-pay | Admitting: Physician Assistant

## 2021-11-25 ENCOUNTER — Ambulatory Visit (HOSPITAL_COMMUNITY)
Admission: RE | Admit: 2021-11-25 | Discharge: 2021-11-25 | Disposition: A | Discharge status: Home / Self Care | Payer: PPO | Source: Ambulatory Visit | Attending: Physician Assistant | Admitting: Physician Assistant

## 2021-11-25 VITALS — BP 110/72 | HR 68 | Ht 68.0 in | Wt 173.4 lb

## 2021-11-25 DIAGNOSIS — I4819 Other persistent atrial fibrillation: Secondary | ICD-10-CM | POA: Diagnosis not present

## 2021-11-25 DIAGNOSIS — D6859 Other primary thrombophilia: Secondary | ICD-10-CM | POA: Diagnosis not present

## 2021-11-25 DIAGNOSIS — I2510 Atherosclerotic heart disease of native coronary artery without angina pectoris: Secondary | ICD-10-CM | POA: Diagnosis not present

## 2021-11-25 DIAGNOSIS — D6869 Other thrombophilia: Secondary | ICD-10-CM

## 2021-11-25 DIAGNOSIS — I110 Hypertensive heart disease with heart failure: Secondary | ICD-10-CM | POA: Insufficient documentation

## 2021-11-25 DIAGNOSIS — E785 Hyperlipidemia, unspecified: Secondary | ICD-10-CM | POA: Insufficient documentation

## 2021-11-25 DIAGNOSIS — I5020 Unspecified systolic (congestive) heart failure: Secondary | ICD-10-CM | POA: Diagnosis not present

## 2021-11-25 NOTE — Progress Notes (Signed)
Primary Care Physician: Jerrol Banana., MD Primary Cardiologist: Dr Ubaldo Glassing Primary Electrophysiologist: Dr Rayann Heman Referring Physician: Dr Thompson Grayer Jeffery Davis is a 76 y.o. adult with a history of HLD, HTN, HFrEF, CAD, atrial fibrillation who presents for follow up in the [facility].  The patient underwent afib ablation with Dr Rayann Heman in 2019. Patient has a CHADS2VASC score of 5. His ILR shows 0% afib burden with only rare and very brief episodes. He is not currently on anticoagulation per patient preference.   Today, he denies symptoms of palpitations, chest pain, shortness of breath, orthopnea, PND, lower extremity edema, dizziness, presyncope, syncope, snoring, daytime somnolence, bleeding, or neurologic sequela. The patient is tolerating medications without difficulties and is otherwise without complaint today.    Atrial Fibrillation Risk Factors:  he does not have symptoms or diagnosis of sleep apnea. he does not have a history of rheumatic fever.   he has a BMI of Body mass index is 26.37 kg/m.Marland Kitchen Filed Weights   11/25/21 0932  Weight: 78.7 kg    Family History  Problem Relation Age of Onset   Cancer Mother        breast and pancreatic   CAD Father    Hypertension Father    Healthy Daughter    Healthy Son      Atrial Fibrillation Management history:  Previous antiarrhythmic drugs: amiodarone  Previous cardioversions: 2019 Previous ablations: 10/17/17 CHADS2VASC score: 5 Anticoagulation history: Xarelto    Past Medical History:  Diagnosis Date   Actinic keratosis    Allergy    Arthritis    degenerative   Atrial fibrillation ([place])    s/p cardioversion 07/18/17   Benign fibroma of prostate    Carpal tunnel syndrome, right    Erectile dysfunction    GERD (gastroesophageal reflux disease)    Hyperlipidemia    Hypertension    Lumbar herniated disc    Lumbar stenosis with neurogenic claudication    LV dysfunction     Neural foraminal stenosis of lumbosacral spine    Primary localized osteoarthrosis of left shoulder 03/01/2016   Wears hearing aid in both ears    Past Surgical History:  Procedure Laterality Date   ABLATION OF DYSRHYTHMIC FOCUS  10/17/2017   ATRIAL FIBRILLATION ABLATION N/A 10/17/2017   Procedure: ATRIAL FIBRILLATION ABLATION;  Surgeon: Thompson Grayer, MD;  Location: [facility];  Service: Cardiovascular;  Laterality: N/A;   CARDIAC CATHETERIZATION  2005   [facility] N/A 07/18/2017   Procedure: CARDIOVERSION;  Surgeon: Teodoro Spray, MD;  Location: [facility];  Service: Cardiovascular;  Laterality: N/A;   CATARACT EXTRACTION  06/2002   COLONOSCOPY     COLONOSCOPY WITH PROPOFOL N/A 05/03/2017   Procedure: COLONOSCOPY WITH PROPOFOL;  Surgeon: Manya Silvas, MD;  Location: [facility] ENDOSCOPY;  Service: Endoscopy;  Laterality: N/A;   ESOPHAGOGASTRODUODENOSCOPY (EGD) WITH PROPOFOL N/A 01/08/2016   Procedure: ESOPHAGOGASTRODUODENOSCOPY (EGD) WITH PROPOFOL;  Surgeon: Manya Silvas, MD;  Location: [facility] ENDOSCOPY;  Service: Endoscopy;  Laterality: N/A;   ESOPHAGOGASTRODUODENOSCOPY (EGD) WITH PROPOFOL N/A 12/24/2018   Procedure: ESOPHAGOGASTRODUODENOSCOPY (EGD) WITH PROPOFOL;  Surgeon: [place], Benay Pike, MD;  Location: [facility] ENDOSCOPY;  Service: Gastroenterology;  Laterality: N/A;   HAND SURGERY     Implantable loop recorder placement  04/30/2018   MDT Reveal LINQ implanted [facility] [id number]) by Dr Rayann Heman for afib management post ablation.   KNEE ARTHROSCOPY Right    LUMBAR LAMINECTOMY/DECOMPRESSION MICRODISCECTOMY N/A 01/09/2017  Procedure: LAMINECTOMY AND FORAMINOTOMY LUMBAR THREE-FOUR LUMBAR FOUR-FIVE  LUMBAR FIVE SACRAL ONE;  Surgeon: Newman Pies, MD;  Location: [facility];  Service: Neurosurgery;  Laterality: N/A;   SHOULDER SURGERY Left 2010   TOTAL SHOULDER ARTHROPLASTY Left 03/01/2016   Procedure: REVERSE TOTAL SHOULDER ARTHROPLASTY;  Surgeon: Marchia Bond, MD;   Location: [place];  Service: Orthopedics;  Laterality: Left;    Current Outpatient Medications  Medication Sig Dispense Refill   amLODipine (NORVASC) 2.5 MG tablet Take 1 tablet by mouth daily.     ascorbic acid (VITAMIN C) 500 MG tablet Take 1 tablet by mouth 2 (two) times daily.     Aspirin 81 MG CAPS Take by mouth.     Cholecalciferol (VITAMIN D3) 5000 units TABS Take 5,000 Units by mouth daily.     Coenzyme Q10 (CO Q 10) 100 MG CAPS Take 100 mg by mouth 2 (two) times daily.      fluorouracil (EFUDEX) 5 % cream Apply 1 Application topically 2 (two) times daily.     Magnesium 250 MG TABS Take 250 mg by mouth 2 (two) times daily.     Misc Natural Products (GLUCOSAMINE CHONDROITIN TRIPLE) TABS Take 1 tablet by mouth 2 (two) times daily.     pantoprazole (PROTONIX) 40 MG tablet TAKE 1 TABLET BY MOUTH 2 TIMES DAILY 180 tablet 0   rosuvastatin (CRESTOR) 10 MG tablet Take 1 tablet (10 mg total) by mouth daily. 90 tablet 1   tadalafil (CIALIS) 20 MG tablet Take 1 tablet (20 mg total) by mouth every three (3) days as needed for erectile dysfunction. 30 tablet 5   telmisartan (MICARDIS) 20 MG tablet Take 20 mg by mouth daily.      tretinoin (RETIN-A) 0.1 % cream      No current facility-administered medications for this encounter.    No Known Allergies  Social History   Socioeconomic History   Marital status: Married    Spouse name: Coralyn Mark   Number of children: 2   Years of education: bachelors   Highest education level: Bachelor's degree (e.g., BA, AB, BS)  Occupational History   Occupation: Retired     Comment: worked at [employer] 12 years.     Employer: [employer]  Tobacco Use   Smoking status: Never   Smokeless tobacco: Never   Tobacco comments:    Never smoke 11/25/21  Vaping Use   Vaping Use: Never used  Substance and Sexual Activity   Alcohol use: Yes    Alcohol/week: 7.0 - 14.0 standard drinks of alcohol    Types: 7 - 14 Glasses of wine per week     Comment: 1-2 glasses of wine daily 11/25/21   Drug use: No   Sexual activity: Not on file  Other Topics Concern   Not on file  Social History Narrative   ** Merged History Encounter **       Social Determinants of Health   Financial Resource Strain: Low Risk  (12/05/2020)   Overall Financial Resource Strain (CARDIA)    Difficulty of Paying Living Expenses: Not hard at all  Food Insecurity: No Food Insecurity (12/05/2020)   Hunger Vital Sign    Worried About Running Out of Food in the Last Year: Never true    Ran Out of Food in the Last Year: Never true  Transportation Needs: No Transportation Needs (12/05/2020)   PRAPARE - Hydrologist (Medical): No    Lack of Transportation (  Non-Medical): No  Physical Activity: Sufficiently Active (12/05/2020)   Exercise Vital Sign    Days of Exercise per Week: 7 days    Minutes of Exercise per Session: 80 min  Stress: No Stress Concern Present (12/05/2020)   [place]    Feeling of Stress : Not at all  Social Connections: [place] (12/05/2020)   Social Connection and Isolation Panel [NHANES]    Frequency of Communication with Friends and Family: More than three times a week    Frequency of Social Gatherings with Friends and Family: More than three times a week    Attends Religious Services: More than 4 times per year    Active Member of [employer] or Organizations: Yes    Attends Music therapist: More than 4 times per year    Marital Status: Married  Human resources officer Violence: Not At Risk (12/05/2020)   Humiliation, Afraid, Rape, and Kick questionnaire    Fear of Current or Ex-Partner: No    Emotionally Abused: No    Physically Abused: No    Sexually Abused: No     ROS- All systems are reviewed and negative except as per the HPI above.  Physical Exam: Vitals:   11/25/21 0932  BP: 110/72  Pulse: 68  Weight: 78.7 kg   Height: '5\' 8"'$  (1.727 m)    GEN- The patient is a well appearing elderly male, alert and oriented x 3 today.   Head- normocephalic, atraumatic Eyes-  Sclera clear, conjunctiva pink Ears- hearing intact Oropharynx- clear Neck- supple  Lungs- Clear to ausculation bilaterally, normal work of breathing Heart- Regular rate and rhythm, no murmurs, rubs or gallops  GI- soft, NT, ND, + BS Extremities- no clubbing, cyanosis, or edema MS- no significant deformity or atrophy Skin- no rash or lesion Psych- euthymic mood, full affect Neuro- strength and sensation are intact  Wt Readings from Last 3 Encounters:  11/25/21 78.7 kg  10/28/21 79.4 kg  07/01/21 79.4 kg    EKG today demonstrates  SR, 1st degree AV block Vent. rate 68 BPM PR interval 270 ms QRS duration 98 ms QT/QTcB 438/465 ms  Echo 2019 demonstrated  MILD LV SYSTOLIC DYSFUNCTION (See above)   WITH MILD LVH  NORMAL RIGHT VENTRICULAR SYSTOLIC FUNCTION  MILD VALVULAR REGURGITATION (See above)  NO VALVULAR STENOSIS  MILD MR, TR, PR  EF 40-45%  Closest EF: 45% (Estimated)  Contraction: MILD GLOBAL DECREASE  Mitral: MILD MR  Tricuspid: MILD TR   Epic records are reviewed at length today  CHA2DS2-VASc Score = 5  The patient's score is based upon: CHF History: 1 HTN History: 1 Diabetes History: 0 Stroke History: 0 Vascular Disease History: 1 Age Score: 2 Gender Score: 0       ASSESSMENT AND PLAN: 1. Persistent Atrial Fibrillation (ICD10:  I48.19) The patient's CHA2DS2-VASc score is 5, indicating a 7.2% annual risk of stroke.   S/p afib ablation 2019 ILR shows 0% afib burden off AAD  2. Secondary Hypercoagulable State (ICD10:  D68.69) The patient is at significant risk for stroke/thromboembolism based upon his CHA2DS2-VASc Score of 5.  However, the patient is not on anticoagulation due to his preference. We did discuss Watchman today to avoid long term anticoagulation. He will consider this.   3. HTN Stable,  no changes today.  4. HFrEF EF 40-45% Fluid status appears stable.  On ARB, GDMT per primary cardiology team.  5. CAD CAC score 1202, 85th percentile No anginal  symptoms.    Follow up in one year to establish care with new EP. Patient also establishing care with Dr Rockey Situ given Dr Bethanne Ginger retirement.    [facility] [address] [place], [place] [address] [phone] 11/25/2021 10:01 AM

## 2021-11-26 ENCOUNTER — Other Ambulatory Visit: Payer: Self-pay | Admitting: Family Medicine

## 2021-11-27 NOTE — Progress Notes (Signed)
Carelink Summary Report / Loop Recorder 

## 2021-11-30 LAB — CUP PACEART REMOTE DEVICE CHECK
Date Time Interrogation Session: 20230916233151
Implantable Pulse Generator Implant Date: 20200217

## 2021-12-06 ENCOUNTER — Ambulatory Visit (INDEPENDENT_AMBULATORY_CARE_PROVIDER_SITE_OTHER): Payer: PPO

## 2021-12-06 DIAGNOSIS — I4819 Other persistent atrial fibrillation: Secondary | ICD-10-CM | POA: Diagnosis not present

## 2021-12-15 NOTE — Progress Notes (Signed)
Carelink Summary Report / Loop Recorder 

## 2021-12-24 DIAGNOSIS — D0321 Melanoma in situ of right ear and external auricular canal: Secondary | ICD-10-CM | POA: Diagnosis not present

## 2021-12-24 DIAGNOSIS — L989 Disorder of the skin and subcutaneous tissue, unspecified: Secondary | ICD-10-CM | POA: Diagnosis not present

## 2022-01-03 ENCOUNTER — Telehealth: Payer: Self-pay

## 2022-01-03 NOTE — Telephone Encounter (Signed)
LINQ alert received.  Device reached RRT 10/13, route to triage  Left detailed message requesting call back to device clinic to set up appointment with new EP doctor to discuss plan of action.  Pt's loop has reached RRT-placed for afib monitoring.

## 2022-01-04 NOTE — Telephone Encounter (Signed)
Attempted to follow up. No answer, LMTCB.

## 2022-01-04 NOTE — Telephone Encounter (Signed)
The patient called back and I let him know that his loop has reached RRT. He can chose to leave it in or to have it removed. He wanted to be on scheduled to discuss his options. I sent the call to [facility] so she can schedule him with Dr. Curt Bears.

## 2022-01-20 DIAGNOSIS — M65332 Trigger finger, left middle finger: Secondary | ICD-10-CM | POA: Diagnosis not present

## 2022-01-20 DIAGNOSIS — M65341 Trigger finger, right ring finger: Secondary | ICD-10-CM | POA: Diagnosis not present

## 2022-02-01 ENCOUNTER — Ambulatory Visit: Payer: PPO | Attending: Cardiology | Admitting: Cardiology

## 2022-02-01 ENCOUNTER — Encounter: Payer: Self-pay | Admitting: Cardiology

## 2022-02-01 VITALS — BP 128/82 | HR 69 | Ht 68.0 in | Wt 175.0 lb

## 2022-02-01 DIAGNOSIS — I4819 Other persistent atrial fibrillation: Secondary | ICD-10-CM

## 2022-02-01 DIAGNOSIS — D6869 Other thrombophilia: Secondary | ICD-10-CM | POA: Diagnosis not present

## 2022-02-01 NOTE — Progress Notes (Signed)
Electrophysiology Office Note   Date:  02/01/2022   ID:  Jeffery Davis 04/29/1945, MRN [id number]  PCP:  Jeffery Davis., MD  Cardiologist:  Jeffery Davis Primary Electrophysiologist:  Kiwan Gadsden Jeffery Leeds, MD    Chief Complaint: AF   History of Present Illness: Jeffery Davis is a 76 y.o. adult who is being seen today for the evaluation of AF at the request of Jeffery Davis.,*. Presenting today for electrophysiology evaluation.  He has a history significant hyperlipidemia, hypertension, heart failure, coronary artery disease, atrial fibrillation.  He is status post ablation in 2019 with PVI, posterior wall and CTI.  He has a Medtronic ILR implanted that has reached RRT.  Feeling well.  He has no chest pain or shortness of breath.  He is able to do all of his daily activities.  He has had no further episodes of atrial fibrillation.  Today, he denies symptoms of palpitations, chest pain, shortness of breath, orthopnea, PND, lower extremity edema, claudication, dizziness, presyncope, syncope, bleeding, or neurologic sequela. The patient is tolerating medications without difficulties.    Past Medical History:  Diagnosis Date   Actinic keratosis    Allergy    Arthritis    degenerative   Atrial fibrillation ([place])    s/p cardioversion 07/18/17   Benign fibroma of prostate    Carpal tunnel syndrome, right    Erectile dysfunction    GERD (gastroesophageal reflux disease)    Hyperlipidemia    Hypertension    Lumbar herniated disc    Lumbar stenosis with neurogenic claudication    LV dysfunction    Melanoma ([place])    Neural foraminal stenosis of lumbosacral spine    Primary localized osteoarthrosis of left shoulder 03/01/2016   Wears hearing aid in both ears    Past Surgical History:  Procedure Laterality Date   ABLATION OF DYSRHYTHMIC FOCUS  10/17/2017   ATRIAL FIBRILLATION ABLATION N/A 10/17/2017   Procedure: ATRIAL FIBRILLATION ABLATION;  Surgeon: Jeffery Grayer, MD;   Location: [place] [facility];  Service: Cardiovascular;  Laterality: N/A;   CARDIAC CATHETERIZATION  2005   [facility]   CARDIOVERSION N/A 07/18/2017   Procedure: CARDIOVERSION;  Surgeon: Teodoro Spray, MD;  Location: [facility] ORS;  Service: Cardiovascular;  Laterality: N/A;   CATARACT EXTRACTION  06/2002   COLONOSCOPY     COLONOSCOPY WITH PROPOFOL N/A 05/03/2017   Procedure: COLONOSCOPY WITH PROPOFOL;  Surgeon: Jeffery Silvas, MD;  Location: [facility] ENDOSCOPY;  Service: Endoscopy;  Laterality: N/A;   ESOPHAGOGASTRODUODENOSCOPY (EGD) WITH PROPOFOL N/A 01/08/2016   Procedure: ESOPHAGOGASTRODUODENOSCOPY (EGD) WITH PROPOFOL;  Surgeon: Jeffery Silvas, MD;  Location: [facility] ENDOSCOPY;  Service: Endoscopy;  Laterality: N/A;   ESOPHAGOGASTRODUODENOSCOPY (EGD) WITH PROPOFOL N/A 12/24/2018   Procedure: ESOPHAGOGASTRODUODENOSCOPY (EGD) WITH PROPOFOL;  Surgeon: Davis, Jeffery Pike, MD;  Location: [facility] ENDOSCOPY;  Service: Gastroenterology;  Laterality: N/A;   HAND SURGERY     Implantable loop recorder placement  04/30/2018   MDT Reveal LINQ implanted [facility] [id number]) by Dr Jeffery Davis for afib management post ablation.   KNEE ARTHROSCOPY Right    LUMBAR LAMINECTOMY/DECOMPRESSION MICRODISCECTOMY N/A 01/09/2017   Procedure: LAMINECTOMY AND FORAMINOTOMY LUMBAR THREE-FOUR LUMBAR FOUR-FIVE  LUMBAR FIVE SACRAL ONE;  Surgeon: Jeffery Pies, MD;  Location: [facility];  Service: Neurosurgery;  Laterality: N/A;   MELANOMA EXCISION Right    BEHIND R EAR   SHOULDER SURGERY Left 2010   TOTAL SHOULDER ARTHROPLASTY Left 03/01/2016   Procedure: REVERSE TOTAL SHOULDER ARTHROPLASTY;  Surgeon:  Jeffery Bond, MD;  Location: [place];  Service: Orthopedics;  Laterality: Left;     Current Outpatient Medications  Medication Sig Dispense Refill   amLODipine (NORVASC) 2.5 MG tablet Take 1 tablet by mouth daily.     ascorbic acid (VITAMIN C) 500 MG tablet Take 1 tablet by mouth 2 (two) times daily.     Aspirin 81 MG CAPS Take by  mouth.     Cholecalciferol (VITAMIN D3) 5000 units TABS Take 5,000 Units by mouth daily.     Coenzyme Q10 (CO Q 10) 100 MG CAPS Take 100 mg by mouth 2 (two) times daily.      fluorouracil (EFUDEX) 5 % cream Apply 1 Application topically 2 (two) times daily.     Magnesium 250 MG TABS Take 250 mg by mouth 2 (two) times daily.     Misc Natural Products (GLUCOSAMINE CHONDROITIN TRIPLE) TABS Take 1 tablet by mouth 2 (two) times daily.     pantoprazole (PROTONIX) 40 MG tablet TAKE 1 TABLET BY MOUTH TWICE DAILY 180 tablet 0   rosuvastatin (CRESTOR) 10 MG tablet Take 1 tablet (10 mg total) by mouth daily. 90 tablet 1   tadalafil (CIALIS) 20 MG tablet Take 1 tablet (20 mg total) by mouth every three (3) days as needed for erectile dysfunction. 30 tablet 5   tretinoin (RETIN-A) 0.1 % cream      telmisartan (MICARDIS) 20 MG tablet Take 20 mg by mouth daily.      No current facility-administered medications for this visit.    Allergies:   Patient has no known allergies.   Social History:  The patient  reports that he has never smoked. He has never used smokeless tobacco. He reports current alcohol use of about 7.0 - 14.0 standard drinks of alcohol per week. He reports that he does not use drugs.   Family History:  The patient's family history includes CAD in his father; Cancer in his mother; Healthy in his daughter and son; Hypertension in his father.    ROS:  Please see the history of present illness.   Otherwise, review of systems is positive for none.   All other systems are reviewed and negative.    PHYSICAL EXAM: VS:  BP 128/82   Pulse 69   Ht '5\' 8"'$  (1.727 m)   Wt 175 lb (79.4 kg)   SpO2 97%   BMI 26.61 kg/m  , BMI Body mass index is 26.61 kg/m. GEN: Well nourished, well developed, in no acute distress  HEENT: normal  Neck: no JVD, carotid bruits, or masses Cardiac: RRR; no murmurs, rubs, or gallops,no edema  Respiratory:  clear to auscultation bilaterally, normal work of  breathing GI: soft, nontender, nondistended, + BS MS: no deformity or atrophy  Skin: warm and dry, device pocket is well healed Neuro:  Strength and sensation are intact Psych: euthymic mood, full affect  EKG:  EKG is not ordered today. Personal review of the ekg ordered 11/25/21 shows sinus rhythm  Device interrogation is reviewed today in detail.  See PaceArt for details.   Recent Labs: 10/28/2021: ALT 33; BUN 24; Creatinine, Ser 1.03; Hemoglobin 13.3; Platelets 262; Potassium 4.5; Sodium 140; TSH 2.120    Lipid Panel     Component Value Date/Time   CHOL 143 10/28/2021 1133   CHOL 173 09/30/2011 0740   TRIG 152 (H) 10/28/2021 1133   TRIG 375 (H) 09/30/2011 0740   HDL 44 10/28/2021 1133   HDL 41 09/30/2011 0740   CHOLHDL  3.1 10/28/2020 0832   VLDL 75 (H) 09/30/2011 0740   LDLCALC 73 10/28/2021 1133   LDLCALC 57 09/30/2011 0740     Wt Readings from Last 3 Encounters:  02/01/22 175 lb (79.4 kg)  11/25/21 173 lb 6.4 oz (78.7 kg)  10/28/21 175 lb (79.4 kg)      Other studies Reviewed: Additional studies/ records that were reviewed today include: TTE 2019  Review of the above records today demonstrates:  MILD LV SYSTOLIC DYSFUNCTION (See above)   WITH MILD LVH  NORMAL RIGHT VENTRICULAR SYSTOLIC FUNCTION  MILD VALVULAR REGURGITATION (See above)  NO VALVULAR STENOSIS  MILD MR, TR, PR  EF 40-45%  Closest EF: 45% (Estimated)  Contraction: MILD GLOBAL DECREASE  Mitral: MILD MR  Tricuspid: MILD TR    ASSESSMENT AND PLAN:  1.  Persistent atrial fibrillation: CHA2DS2-VASc of 5.  Currently not anticoagulated.  No further episodes of atrial fibrillation since 2019 for ILR.  Point, he is happy to leave his ILR in place.  He continues to not want to be on anticoagulation.  We Jed Kutch continue with current management.  2.  Secondary hypercoagulable state: CHA2DS2-VASc of 5.  Is off anticoagulation as no further episodes since 2019.  3.  Hypertension: Currently well  controlled  4.  Chronic systolic heart failure: Ejection fraction 40 to 45%.  On goal-directed medical therapy per primary cardiology.  5.  Coronary artery disease: Elevated calcium score.  No current angina.    Current medicines are reviewed at length with the patient today.   The patient does not have concerns regarding his medicines.  The following changes were made today:  none  Labs/ tests ordered today include:  No orders of the defined types were placed in this encounter.    Disposition:   FU with Zay Yeargan 1 year  Signed, Julio Zappia Jeffery Leeds, MD  02/01/2022 2:03 PM     [address] [place] [address] [phone] (office) [phone] (fax)

## 2022-02-01 NOTE — Patient Instructions (Signed)
Medication Instructions:  Your physician recommends that you continue on your current medications as directed. Please refer to the Current Medication list given to you today.  *If you need a refill on your cardiac medications before your next appointment, please call your pharmacy*   Lab Work: None ordered   Testing/Procedures: None ordered   Follow-Up: At [facility], you and your health needs are our priority.  As part of our continuing mission to provide you with exceptional heart care, we have created designated Provider Care Teams.  These Care Teams include your primary Cardiologist (physician) and Advanced Practice Providers (APPs -  Physician Assistants and Nurse Practitioners) who all work together to provide you with the care you need, when you need it.  Your next appointment:   1 year(s)  The format for your next appointment:   In Person  Provider:   You will follow up in the [facility] located at [facility] Your provider will be: Clint R. Fenton, PA-C\    Thank you for choosing [facility]!!   Trinidad Curet, RN [phone]  Other Instructions   Important Information About Sugar

## 2022-03-02 ENCOUNTER — Telehealth: Payer: Self-pay | Admitting: Family Medicine

## 2022-03-02 MED ORDER — PANTOPRAZOLE SODIUM 40 MG PO TBEC: 40.0000 mg | DELAYED_RELEASE_TABLET | Freq: Two times a day (BID) | ORAL | 0 refills | Status: DC

## 2022-03-02 NOTE — Telephone Encounter (Signed)
[facility] pharmacy faxed refill request for the following medications:    pantoprazole (PROTONIX) 40 MG tablet   Please advise

## 2022-03-15 ENCOUNTER — Other Ambulatory Visit: Payer: Self-pay | Admitting: Family Medicine

## 2022-03-15 ENCOUNTER — Telehealth: Payer: Self-pay | Admitting: Family Medicine

## 2022-03-15 DIAGNOSIS — N529 Male erectile dysfunction, unspecified: Secondary | ICD-10-CM

## 2022-03-15 MED ORDER — TADALAFIL 20 MG PO TABS: 20.0000 mg | ORAL_TABLET | ORAL | 2 refills | Status: AC | PRN

## 2022-03-15 NOTE — Telephone Encounter (Signed)
Refills for Cialis sent to [place], [place]  [facility] [place]

## 2022-03-15 NOTE — Telephone Encounter (Signed)
Patient is asking for refills on Cialis 20 mg.  to be sent to [place]

## 2022-03-27 NOTE — Progress Notes (Unsigned)
Cardiology Office Note  Date:  03/28/2022   ID:  Jeffery Davis, DOB 05-May-1945, MRN [id number]  PCP:  Jerrol Banana., MD   Chief Complaint  Patient presents with   New Patient (Initial Visit)    Establish care for a history of A-Fib. "Doing well." Medications reviewed by the patient verbally.      HPI:  [name] is a 77 year old gentleman with past medical history of Hyperlipidemia Hypertension Coronary artery disease, elevated calcium score 1200 and July 2019, Atrial fibrillation, status post ablation 2019 with PVI, posterior wall and CTI CHA2DS2-VASc 5, followed by EP, off anticoagulation as no further episodes of atrial fibrillation since 2019 Medtronic ILR implanted that has reached RRT EF 40-45%  in 02/2018 Who presents to establish care in the [facility] cardiology office for his A-fib  In general reports he has been doing well Denies significant arrhythmia over the past 3 to 4 years since ablation Loop recorder in place, no significant afib, rare episodes less than 15 minutes  Active at baseline Walks daily 4-6 miles [facility]  Some SOB on exertion recently 1 month of chest pain, sometimes at rest, sometimes with exertion  CT scan chest pulled up from 2019 showing heavy coronary calcification in the LAD, mild in the left circumflex and RCA  Previous echocardiogram with ejection fraction 40 to 45% Prior stress echo 2021, results unavailable  Lab work reviewed Total cholesterol 143 LDL 73 Normal CMP Hemoglobin 13  Family hx: Father dies 3 Garndfather dies 50  EKG personally reviewed by myself on todays visit Normal sinus rhythm rate 63 bpm no significant ST-T wave changes   PMH:   has a past medical history of Actinic keratosis, Allergy, Arthritis, Atrial fibrillation ([place]), Benign fibroma of prostate, Carpal tunnel syndrome, right, Erectile dysfunction, GERD (gastroesophageal reflux disease), Hyperlipidemia, Hypertension, Lumbar herniated  disc, Lumbar stenosis with neurogenic claudication, LV dysfunction, Melanoma ([place]), Neural foraminal stenosis of lumbosacral spine, Primary localized osteoarthrosis of left shoulder (03/01/2016), and Wears hearing aid in both ears.  PSH:    Past Surgical History:  Procedure Laterality Date   ABLATION OF DYSRHYTHMIC FOCUS  10/17/2017   ATRIAL FIBRILLATION ABLATION N/A 10/17/2017   Procedure: ATRIAL FIBRILLATION ABLATION;  Surgeon: Thompson Grayer, MD;  Location: [facility];  Service: Cardiovascular;  Laterality: N/A;   CARDIAC CATHETERIZATION  2005   [facility]   CARDIOVERSION N/A 07/18/2017   Procedure: CARDIOVERSION;  Surgeon: Teodoro Spray, MD;  Location: [facility] ORS;  Service: Cardiovascular;  Laterality: N/A;   CATARACT EXTRACTION  06/2002   COLONOSCOPY     COLONOSCOPY WITH PROPOFOL N/A 05/03/2017   Procedure: COLONOSCOPY WITH PROPOFOL;  Surgeon: Manya Silvas, MD;  Location: [facility] ENDOSCOPY;  Service: Endoscopy;  Laterality: N/A;   ESOPHAGOGASTRODUODENOSCOPY (EGD) WITH PROPOFOL N/A 01/08/2016   Procedure: ESOPHAGOGASTRODUODENOSCOPY (EGD) WITH PROPOFOL;  Surgeon: Manya Silvas, MD;  [facility] END[facility];  Service: Endoscopy;  Laterality: N/A;   ESOPHAGOGASTRODUODENOSCOPY (EGD) WITH PROPOFOL N/A 12/24/2018   Procedure: ESOPHAGOGASTRODUODENOSCOPY (EGD) WITH PROPOFOL;  Surgeon: Tol[place], Benay Pike, MD;  Location: [facility] ENDOSCOPY;  Service: Gastroenterology;  Laterality: N/A;   HAND SURGERY     Implantable loop recorder placement  04/30/2018   MDT Reveal LINQ implanted [facility] [id number]) by Dr Rayann Heman for afib management post ablation.   KNEE ARTHROSCOPY Right    LUMBAR LAMINECTOMY/DECOMPRESSION MICRODISCECTOMY N/A 01/09/2017   Procedure: LAMINECTOMY AND FORAMINOTOMY LUMBAR THREE-FOUR LUMBAR FOUR-FIVE  LUMBAR FIVE SACRAL ONE;  Surgeon: Newman Pies, MD;  Location: [place] [facility];  Service: Neurosurgery;  Laterality: N/A;   MELANOMA EXCISION Right    BEHIND R EAR   SHOULDER  SURGERY Left 2010   TOTAL SHOULDER ARTHROPLASTY Left 03/01/2016   Procedure: REVERSE TOTAL SHOULDER ARTHROPLASTY;  Surgeon: Marchia Bond, MD;  Location: [place];  Service: Orthopedics;  Laterality: Left;    Current Outpatient Medications  Medication Sig Dispense Refill   amLODipine (NORVASC) 2.5 MG tablet Take 1 tablet by mouth daily.     ascorbic acid (VITAMIN C) 500 MG tablet Take 1 tablet by mouth 2 (two) times daily.     Aspirin 81 MG CAPS Take by mouth.     Cholecalciferol (VITAMIN D3) 5000 units TABS Take 5,000 Units by mouth daily.     Coenzyme Q10 (CO Q 10) 100 MG CAPS Take 100 mg by mouth 2 (two) times daily.      fluorouracil (EFUDEX) 5 % cream Apply 1 Application topically 2 (two) times daily.     Magnesium 250 MG TABS Take 250 mg by mouth 2 (two) times daily.     Misc Natural Products (GLUCOSAMINE CHONDROITIN TRIPLE) TABS Take 1 tablet by mouth 2 (two) times daily.     pantoprazole (PROTONIX) 40 MG tablet Take 1 tablet (40 mg total) by mouth 2 (two) times daily. 180 tablet 0   rosuvastatin (CRESTOR) 10 MG tablet Take 1 tablet (10 mg total) by mouth daily. 90 tablet 1   tadalafil (CIALIS) 20 MG tablet Take 1 tablet (20 mg total) by mouth every three (3) days as needed for erectile dysfunction. 30 tablet 2   telmisartan (MICARDIS) 20 MG tablet Take 20 mg by mouth daily.      tretinoin (RETIN-A) 0.1 % cream      No current facility-administered medications for this visit.    Allergies:   Patient has no known allergies.   Social History:  The patient  reports that he has never smoked. He has never used smokeless tobacco. He reports current alcohol use of about 7.0 - 14.0 standard drinks of alcohol per week. He reports that he does not use drugs.   Family History:   family history includes CAD in his father; Cancer in his mother; Healthy in his daughter and son; Hypertension in his father.    Review of Systems: Review of Systems  Constitutional: Negative.   HENT: Negative.     Respiratory: Negative.    Cardiovascular: Negative.   Gastrointestinal: Negative.   Musculoskeletal: Negative.   Neurological: Negative.   Psychiatric/Behavioral: Negative.    All other systems reviewed and are negative.    PHYSICAL EXAM: VS:  BP 130/68 (BP Location: Right Arm, Patient Position: Sitting, Cuff Size: Normal)   Pulse 63   Ht '5\' 8"'$  (1.727 m)   Wt 172 lb 4 oz (78.1 kg)   SpO2 98%   BMI 26.19 kg/m  , BMI Body mass index is 26.19 kg/m. GEN: Well nourished, well developed, in no acute distress HEENT: normal Neck: no JVD, carotid bruits, or masses Cardiac: RRR; no murmurs, rubs, or gallops,no edema  Respiratory:  clear to auscultation bilaterally, normal work of breathing GI: soft, nontender, nondistended, + BS MS: no deformity or atrophy Skin: warm and dry, no rash Neuro:  Strength and sensation are intact Psych: euthymic mood, full affect   Recent Labs: 10/28/2021: ALT 33; BUN 24; Creatinine, Ser 1.03; Hemoglobin 13.3; Platelets 262; Potassium 4.5; Sodium 140; TSH 2.120    Lipid Panel Lab Results  Component Value Date  CHOL 143 10/28/2021   HDL 44 10/28/2021   LDLCALC 73 10/28/2021   TRIG 152 (H) 10/28/2021      Wt Readings from Last 3 Encounters:  03/28/22 172 lb 4 oz (78.1 kg)  02/01/22 175 lb (79.4 kg)  11/25/21 173 lb 6.4 oz (78.7 kg)     ASSESSMENT AND PLAN:  Problem List Items Addressed This Visit       Cardiology Problems   Persistent atrial fibrillation ([place]) - Primary   Hypercholesteremia   Other Visit Diagnoses     Typical atrial flutter (HCC)       Nonischemic cardiomyopathy ([place])       Coronary artery disease of native artery of native heart with stable angina pectoris ([place])       Essential hypertension          Paroxysmal atrial fibrillation Minimal episodes, typically rare and short-lived on loop monitor less than 15 minutes in duration Asymptomatic, loop battery now at end-of-life Not on anticoagulation given minimal  arrhythmia Followed by EP  Coronary artery disease with stable angina Reports having some chest discomfort, shortness of breath Heavy coronary calcification noted in the LAD, mild in the left circumflex and RCA on CT scan from 2019 Recommend Myoview for further evaluation For worsening anginal symptoms may need cardiac catheterization  Hyperlipidemia Continue Crestor 10, add Zetia 10 mg daily to achieve goal LDL less than 60  Essential hypertension Blood pressure is well controlled on today's visit. No changes made to the medications.  Nonischemic cardiomyopathy Ejection fraction 45%, at that time was having A-fib, ablation Will estimated ejection fraction on nuclear stress test as above    Total encounter time more than 60 minutes  Greater than 50% was spent in counseling and coordination of care with the patient    Signed, Esmond Plants, M.D., Ph.D. [place], [place]

## 2022-03-28 ENCOUNTER — Ambulatory Visit: Payer: PPO | Attending: Cardiovascular Disease | Admitting: Cardiovascular Disease

## 2022-03-28 ENCOUNTER — Encounter: Payer: Self-pay | Admitting: Cardiovascular Disease

## 2022-03-28 VITALS — BP 130/68 | HR 63 | Ht 68.0 in | Wt 172.2 lb

## 2022-03-28 DIAGNOSIS — I4819 Other persistent atrial fibrillation: Secondary | ICD-10-CM | POA: Diagnosis not present

## 2022-03-28 DIAGNOSIS — I428 Other cardiomyopathies: Secondary | ICD-10-CM

## 2022-03-28 DIAGNOSIS — I25118 Atherosclerotic heart disease of native coronary artery with other forms of angina pectoris: Secondary | ICD-10-CM | POA: Diagnosis not present

## 2022-03-28 DIAGNOSIS — I10 Essential (primary) hypertension: Secondary | ICD-10-CM | POA: Diagnosis not present

## 2022-03-28 DIAGNOSIS — R072 Precordial pain: Secondary | ICD-10-CM

## 2022-03-28 DIAGNOSIS — I483 Typical atrial flutter: Secondary | ICD-10-CM

## 2022-03-28 DIAGNOSIS — E7800 Pure hypercholesterolemia, unspecified: Secondary | ICD-10-CM | POA: Diagnosis not present

## 2022-03-28 MED ORDER — EZETIMIBE 10 MG PO TABS: 10.0000 mg | ORAL_TABLET | Freq: Every day | ORAL | 3 refills | Status: DC

## 2022-03-28 NOTE — Patient Instructions (Addendum)
Medication Instructions:  - Your physician has recommended you make the following change in your medication:   1) START zetia (ezetimibe) 10 mg: - take 1 tablet by mouth once daily  If you need a refill on your cardiac medications before your next appointment, please call your pharmacy.    Lab work: No new labs needed   Testing/Procedures: - Your physician has requested that you have a lexiscan myoview.   [place]  Your caregiver has ordered a Stress Test with nuclear imaging. The purpose of this test is to evaluate the blood supply to your heart muscle. This procedure is referred to as a "Non-Invasive Stress Test." This is because other than having an IV started in your vein, nothing is inserted or "invades" your body. Cardiac stress tests are done to find areas of poor blood flow to the heart by determining the extent of coronary artery disease (CAD). Some patients exercise on a treadmill, which naturally increases the blood flow to your heart, while others who are  unable to walk on a treadmill due to physical limitations have a pharmacologic/chemical stress agent called Lexiscan . This medicine will mimic walking on a treadmill by temporarily increasing your coronary blood flow.   Please note: these test may take anywhere between 2-4 hours to complete  PLEASE REPORT TO [facility] AT THE FIRST DESK WILL DIRECT YOU WHERE TO GO  Date of Procedure:_____________________________________  Arrival Time for Procedure:______________________________  Instructions regarding medication:    _xx___:  You may take all of your regular medications the day of your test with enough water to get them down safely.   PLEASE NOTIFY THE OFFICE AT LEAST 67 HOURS IN ADVANCE IF YOU ARE UNABLE TO KEEP YOUR APPOINTMENT.  [phone] AND  PLEASE NOTIFY NUCLEAR MEDICINE AT [facility] AT LEAST 24 HOURS IN ADVANCE IF YOU ARE UNABLE TO KEEP YOUR APPOINTMENT. [phone]  How  to prepare for your Myoview test:  Do not eat or drink after midnight No caffeine for 24 hours prior to test No smoking 24 hours prior to test. Your medication may be taken with water.  If your doctor stopped a medication because of this test, do not take that medication. Ladies, please do not wear dresses.  Skirts or pants are appropriate. Please wear a short sleeve shirt. No perfume, cologne or lotion. Wear comfortable walking shoes. No heels!   Follow-Up: At [facility], you and your health needs are our priority.  As part of our continuing mission to provide you with exceptional heart care, we have created designated Provider Care Teams.  These Care Teams include your primary Cardiologist (physician) and Advanced Practice Providers (APPs -  Physician Assistants and Nurse Practitioners) who all work together to provide you with the care you need, when you need it.  You will need a follow up appointment in 12 months  Providers on your designated Care Team:   Murray Hodgkins, NP Christell Faith, PA-C Cadence Kathlen Mody, [place]  COVID-19 Vaccine Information can be found at: ShippingScam.co.uk For questions related to vaccine distribution or appointments, please email [email or web address] .[email or web address] or call [phone].    Cardiac Nuclear Scan A cardiac nuclear scan is a test that measures blood flow to the heart when a person is resting and when exercising. The test looks for problems such as: Not enough blood reaching a portion of the heart. The heart muscle not working normally. You may need this test if: You have heart disease. You have had abnormal  lab results. You have had heart surgery or a balloon procedure to open up blocked arteries (angioplasty) or a small mesh tube (stent). You have chest pain. You have shortness of breath. You have had a heart attack (myocardial infarction). In this test, a radioactive dye (tracer) is injected  into your bloodstream. After the tracer has traveled to your heart, an imaging device is used to measure how much of the tracer is absorbed by or distributed to various areas of your heart. This procedure is usually done at a hospital or clinic and takes 2-4 hours. Tell a health care provider about: Any allergies you have. All medicines you are taking, including vitamins, herbs, eye drops, creams, and over-the-counter medicines. Any bleeding problems you have. Any surgeries you have had. Any medical conditions you have. Whether you are pregnant or may be pregnant. Any history of asthma or chronic lung disease. Any history of heart rhythm disorders or heart valve conditions. What are the risks? Your health care provider will talk with you about risks. These may include: Serious chest pain and heart attack. This is only a risk if the stress portion of the test is done. Fast or irregular heartbeats (palpitations). Sensation of warmth in your chest. This usually passes quickly. Allergic reaction to the tracer. This is generally mild and extremely rare. Shortness of breath or trouble breathing. What happens before the procedure? Ask your health care provider about changing or stopping your regular medicines. This is especially important if you are taking diabetes medicines or blood thinners. Follow instructions from your health care provider about eating or drinking restrictions. Remove your jewelry on the day of the procedure. Ask your health care provider about nicotine or caffeine restrictions. What happens during the procedure? An IV will be inserted into one of your veins. Your health care provider will inject a small amount of radioactive tracer through the IV. You will wait for 20-40 minutes while the tracer travels through your bloodstream. Your heart activity will be monitored with an electrocardiogram (ECG). You will lie down on an exam table. Images of your heart will be taken for  about 15-20 minutes. You may also have a stress test. For this test, one of the following may be done: You will exercise on a treadmill or stationary bike. While you exercise, your heart's activity will be monitored with an ECG, and your blood pressure will be checked. You will be given medicines that will increase blood flow to parts of your heart. This is done if you are unable to exercise. When blood flow to your heart has peaked, a tracer will again be injected through the IV. After 20-40 minutes, you will get back on the exam table and have more images taken of your heart. Depending on the type of tracer used, scans may need to be repeated 3-4 hours later. Your IV line will be removed when the procedure is over. The procedure may vary among health care providers and hospitals. What happens after the procedure? Unless your health care provider tells you not to, return to your normal schedule, including diet, activities, international travel, and medicines. Unless your health care provider tells you not to, increase your fluid intake. Drinking more fluid will help to flush the tracer from your body. Ask your health care provider, or the department that is doing the test: When will my results be ready? How will I get my results? What are my treatment options? What other tests do I need? What are my next  steps? This information is not intended to replace advice given to you by your health care provider. Make sure you discuss any questions you have with your health care provider. Document Revised: 07/27/2021 Document Reviewed: 07/27/2021 Elsevier Patient Education  [place].

## 2022-04-04 ENCOUNTER — Encounter
Admission: RE | Admit: 2022-04-04 | Discharge: 2022-04-04 | Disposition: A | Discharge status: Home / Self Care | Payer: PPO | Source: Ambulatory Visit | Attending: Cardiovascular Disease | Admitting: Cardiovascular Disease

## 2022-04-04 DIAGNOSIS — R072 Precordial pain: Secondary | ICD-10-CM | POA: Insufficient documentation

## 2022-04-04 DIAGNOSIS — I25118 Atherosclerotic heart disease of native coronary artery with other forms of angina pectoris: Secondary | ICD-10-CM | POA: Diagnosis not present

## 2022-04-04 LAB — NM MYOCAR MULTI W/SPECT W/WALL MOTION / EF
Estimated workload: 1
Exercise duration (min): 0 min
Exercise duration (sec): 0 s
LV dias vol: 136 mL (ref 62–150)
LV sys vol: 66 mL (ref 21–61)
MPHR: 144 {beats}/min
Peak HR: 75 {beats}/min
Percent HR: 52 %
Rest HR: 57 {beats}/min
Rest Nuclear Isotope Dose: 10.8 mCi
SDS: 0
SRS: 6
SSS: 1
ST Depression (mm): 0 mm
Stress Nuclear Isotope Dose: 29.5 mCi
TID: 1

## 2022-04-04 MED ORDER — REGADENOSON 0.4 MG/5ML IV SOLN
0.4000 mg | Freq: Once | INTRAVENOUS | Status: AC
  Administered 2022-04-04: 0.4 mg via INTRAVENOUS

## 2022-04-04 MED ORDER — TECHNETIUM TC 99M TETROFOSMIN IV KIT
10.0000 | PACK | Freq: Once | INTRAVENOUS | Status: AC | PRN
  Administered 2022-04-04: 10.84 via INTRAVENOUS

## 2022-04-04 MED ORDER — TECHNETIUM TC 99M TETROFOSMIN IV KIT
29.5100 | PACK | Freq: Once | INTRAVENOUS | Status: AC | PRN
  Administered 2022-04-04: 29.51 via INTRAVENOUS

## 2022-04-05 DIAGNOSIS — D485 Neoplasm of uncertain behavior of skin: Secondary | ICD-10-CM | POA: Diagnosis not present

## 2022-04-05 DIAGNOSIS — D2261 Melanocytic nevi of right upper limb, including shoulder: Secondary | ICD-10-CM | POA: Diagnosis not present

## 2022-04-05 DIAGNOSIS — D225 Melanocytic nevi of trunk: Secondary | ICD-10-CM | POA: Diagnosis not present

## 2022-04-05 DIAGNOSIS — D2262 Melanocytic nevi of left upper limb, including shoulder: Secondary | ICD-10-CM | POA: Diagnosis not present

## 2022-04-05 DIAGNOSIS — D2271 Melanocytic nevi of right lower limb, including hip: Secondary | ICD-10-CM | POA: Diagnosis not present

## 2022-04-05 DIAGNOSIS — L570 Actinic keratosis: Secondary | ICD-10-CM | POA: Diagnosis not present

## 2022-04-05 DIAGNOSIS — L905 Scar conditions and fibrosis of skin: Secondary | ICD-10-CM | POA: Diagnosis not present

## 2022-04-05 DIAGNOSIS — Z85820 Personal history of malignant melanoma of skin: Secondary | ICD-10-CM | POA: Diagnosis not present

## 2022-04-05 DIAGNOSIS — D2272 Melanocytic nevi of left lower limb, including hip: Secondary | ICD-10-CM | POA: Diagnosis not present

## 2022-04-05 DIAGNOSIS — Z85828 Personal history of other malignant neoplasm of skin: Secondary | ICD-10-CM | POA: Diagnosis not present

## 2022-04-05 DIAGNOSIS — L578 Other skin changes due to chronic exposure to nonionizing radiation: Secondary | ICD-10-CM | POA: Diagnosis not present

## 2022-04-05 DIAGNOSIS — Z08 Encounter for follow-up examination after completed treatment for malignant neoplasm: Secondary | ICD-10-CM | POA: Diagnosis not present

## 2022-04-08 ENCOUNTER — Telehealth: Payer: Self-pay | Admitting: Cardiovascular Disease

## 2022-04-08 NOTE — Telephone Encounter (Signed)
Patient called to follow-up on test results. 

## 2022-04-08 NOTE — Telephone Encounter (Signed)
Pt called requesting stress test result. Nurse informed pt once results are reviewed by MD, nurse would call pt. Pt became upset stating he would really like to know his results.

## 2022-04-11 NOTE — Telephone Encounter (Signed)
Patient made aware of results and comments via MyChart: Seen by patient Jeffery Davis on 04/09/2022  7:52 PM

## 2022-04-21 ENCOUNTER — Encounter (HOSPITAL_COMMUNITY): Payer: Self-pay | Admitting: *Deleted

## 2022-05-10 DIAGNOSIS — Z981 Arthrodesis status: Secondary | ICD-10-CM | POA: Diagnosis not present

## 2022-05-19 DIAGNOSIS — H5203 Hypermetropia, bilateral: Secondary | ICD-10-CM | POA: Diagnosis not present

## 2022-05-19 DIAGNOSIS — Z135 Encounter for screening for eye and ear disorders: Secondary | ICD-10-CM | POA: Diagnosis not present

## 2022-05-19 DIAGNOSIS — H52223 Regular astigmatism, bilateral: Secondary | ICD-10-CM | POA: Diagnosis not present

## 2022-05-19 DIAGNOSIS — Z961 Presence of intraocular lens: Secondary | ICD-10-CM | POA: Diagnosis not present

## 2022-05-19 DIAGNOSIS — H43813 Vitreous degeneration, bilateral: Secondary | ICD-10-CM | POA: Diagnosis not present

## 2022-05-19 DIAGNOSIS — H524 Presbyopia: Secondary | ICD-10-CM | POA: Diagnosis not present

## 2022-06-08 ENCOUNTER — Other Ambulatory Visit: Payer: Self-pay | Admitting: Family Medicine

## 2022-06-08 NOTE — Telephone Encounter (Signed)
Patient is due for office visit with another provider at the clinic since Dr. Rosanna Randy is no longer with [facility] to follow-up on chronic disease.

## 2022-06-08 NOTE — Telephone Encounter (Signed)
Requested Prescriptions  Pending Prescriptions Disp Refills   pantoprazole (PROTONIX) 40 MG tablet [Pharmacy Med Name: PANTOPRAZOLE SODIUM 40 MG DR TAB] 180 tablet 0    Sig: TAKE ONE (1) TABLET BY MOUTH TWO TIMES PER DAY     Gastroenterology: Proton Pump Inhibitors Passed - 06/08/2022  9:21 AM      Passed - Valid encounter within last 12 months    Recent Outpatient Visits           7 months ago Medicare annual wellness visit, subsequent   [place] Eulas Post, MD   11 months ago Diarrhea, unspecified type   [facility] Eulas Post, MD   1 year ago Essential hypertension   [place] Eulas Post, MD   1 year ago Gastroesophageal reflux disease, unspecified whether esophagitis present   [facility] Eulas Post, MD   1 year ago Encounter for Medicare annual wellness exam   [facility] Eulas Post, MD

## 2022-07-18 ENCOUNTER — Telehealth: Payer: Self-pay | Admitting: *Deleted

## 2022-07-18 NOTE — Patient Outreach (Signed)
  Care Coordination   07/18/2022 Name: Jeffery Davis MRN: [id number] DOB: September 15, 1945   Care Coordination Outreach Attempts:  An unsuccessful telephone outreach was attempted today to offer the patient information about available care coordination services.  Follow Up Plan:  Additional outreach attempts will be made to offer the patient care coordination information and services.   Encounter Outcome:  No Answer   Care Coordination Interventions:  No, not indicated    Kemper Durie, RN, MSN, [facility] Care Management Care Management Coordinator [phone]

## 2022-07-21 ENCOUNTER — Telehealth: Payer: Self-pay | Admitting: *Deleted

## 2022-07-21 NOTE — Patient Outreach (Signed)
  Care Coordination   07/21/2022 Name: Jeffery Davis MRN: [id number] DOB: 1945/05/23   Care Coordination Outreach Attempts:  A second unsuccessful outreach was attempted today to offer the patient with information about available care coordination services.  Follow Up Plan:  Additional outreach attempts will be made to offer the patient care coordination information and services.   Encounter Outcome:  No Answer   Care Coordination Interventions:  No, not indicated    Kemper Durie, RN, MSN, [facility] Care Management Care Management Coordinator [phone]

## 2022-07-26 ENCOUNTER — Telehealth: Payer: Self-pay | Admitting: *Deleted

## 2022-07-26 ENCOUNTER — Encounter: Payer: Self-pay | Admitting: *Deleted

## 2022-07-26 NOTE — Patient Outreach (Signed)
  Care Coordination   Initial Visit Note   07/26/2022 Name: Jeffery Davis MRN: [id number] DOB: 10/24/1945  Jeffery Davis is a 77 y.o. year old adult who sees Bosie Clos, MD for primary care. I spoke with  Jeffery November by phone today.  What matters to the patients health and wellness today?  Patient report he is doing well, working on staying healthy (follows proper diet, monitors blood pressure and heart rate daily).  Does not feel follow up is needed but will contact this care manager with questions.     Goals Addressed             This Visit's Progress    COMPLETED: Care Coordination Activities - No follow up needed       Care Coordination Interventions: Patient interviewed about adult health maintenance status including  Pneumonia Vaccine Influenza Vaccine COVID vaccination    Blood Pressure    Advised patient to discuss  Colonoscopy    with primary care provider  Provided education about overall health maintenance SDOH assessment completed         SDOH assessments and interventions completed:  Yes  SDOH Interventions Today    Flowsheet Row Most Recent Value  SDOH Interventions   Food Insecurity Interventions Intervention Not Indicated  Housing Interventions Intervention Not Indicated  Transportation Interventions Intervention Not Indicated        Care Coordination Interventions:  Yes, provided   Interventions Today    Flowsheet Row Most Recent Value  Chronic Disease   Chronic disease during today's visit Other  [Health maintenance]  General Interventions   General Interventions Discussed/Reviewed General Interventions Reviewed, Vaccines, Doctor Visits  Vaccines COVID-19, Flu, Pneumonia  Doctor Visits Discussed/Reviewed Doctor Visits Reviewed, Annual Wellness Visits  [AWV due August]  Education Interventions   Education Provided Provided Web-based Education  Provided Verbal Education On Other, When to see the doctor, Nutrition  [health  maintenance]  Nutrition Interventions   Nutrition Discussed/Reviewed Nutrition Reviewed, Decreasing salt, Decreasing sugar intake, Adding fruits and vegetables        Follow up plan: No further intervention required.   Encounter Outcome:  Pt. Visit Completed   Kemper Durie, RN, MSN, [facility] Care Management Care Management Coordinator [phone]

## 2022-08-18 ENCOUNTER — Telehealth: Payer: Self-pay

## 2022-08-18 NOTE — Telephone Encounter (Signed)
   Pre-operative Risk Assessment    Patient Name: Jeffery Davis  DOB: 1945-05-27 MRN: [id number]      Request for Surgical Clearance    Procedure:   Left middle finger a1 pulley/trigger release with tenosynovectomy. Left ring finger subtotal palmar fasciectomy with repair as necessary due to Dupuytren's contracture  Date of Surgery:  Clearance 08/23/22                                 Surgeon:  Dr. Dominica Severin Surgeon's Group or Practice Name:  [facility] Phone number:  [phone] Fax number:  [phone] ATTN: Rosalva Ferron   Type of Clearance Requested:   - Medical  - Pharmacy:  Hold Aspirin pt will need instructions on when/if to hold   Type of Anesthesia:   Block with IV sedation   Additional requests/questions:    Signed, Zada Finders   08/18/2022, 3:53 PM

## 2022-08-19 ENCOUNTER — Ambulatory Visit: Payer: PPO | Attending: Physician Assistant

## 2022-08-19 ENCOUNTER — Telehealth: Payer: Self-pay | Admitting: *Deleted

## 2022-08-19 DIAGNOSIS — Z01810 Encounter for preprocedural cardiovascular examination: Secondary | ICD-10-CM

## 2022-08-19 NOTE — Telephone Encounter (Signed)
Pt has been scheduled for tele pre op appt add on today per Jari Favre, PAC @ 1:20. Pt states he has been holding ASA as of last week. Med rec and consent are done.

## 2022-08-19 NOTE — Telephone Encounter (Signed)
Pt has been scheduled for tele pre op appt add on today per Jari Favre, PAC @ 1:20. Pt states he has been holding ASA as of last week. Med rec and consent are done.      Patient Consent for Virtual Visit        Jeffery Davis has provided verbal consent on 08/19/2022 for a virtual visit (video or telephone).   CONSENT FOR VIRTUAL VISIT FOR:  Jeffery Davis  By participating in this virtual visit I agree to the following:  I hereby voluntarily request, consent and authorize [facility] and its employed or contracted physicians, physician assistants, nurse practitioners or other licensed health care professionals (the Practitioner), to provide me with telemedicine health care services (the "Services") as deemed necessary by the treating Practitioner. I acknowledge and consent to receive the Services by the Practitioner via telemedicine. I understand that the telemedicine visit will involve communicating with the Practitioner through live audiovisual communication technology and the disclosure of certain medical information by electronic transmission. I acknowledge that I have been given the opportunity to request an in-person assessment or other available alternative prior to the telemedicine visit and am voluntarily participating in the telemedicine visit.  I understand that I have the right to withhold or withdraw my consent to the use of telemedicine in the course of my care at any time, without affecting my right to future care or treatment, and that the Practitioner or I may terminate the telemedicine visit at any time. I understand that I have the right to inspect all information obtained and/or recorded in the course of the telemedicine visit and may receive copies of available information for a reasonable fee.  I understand that some of the potential risks of receiving the Services via telemedicine include:  Delay or interruption in medical evaluation due to technological equipment  failure or disruption; Information transmitted may not be sufficient (e.g. poor resolution of images) to allow for appropriate medical decision making by the Practitioner; and/or  In rare instances, security protocols could fail, causing a breach of personal health information.  Furthermore, I acknowledge that it is my responsibility to provide information about my medical history, conditions and care that is complete and accurate to the best of my ability. I acknowledge that Practitioner's advice, recommendations, and/or decision may be based on factors not within their control, such as incomplete or inaccurate data provided by me or distortions of diagnostic images or specimens that may result from electronic transmissions. I understand that the practice of medicine is not an exact science and that Practitioner makes no warranties or guarantees regarding treatment outcomes. I acknowledge that a copy of this consent can be made available to me via my patient portal [facility] MyChart), or I can request a printed copy by calling the office of [facility].    I understand that my insurance will be billed for this visit.   I have read or had this consent read to me. I understand the contents of this consent, which adequately explains the benefits and risks of the Services being provided via telemedicine.  I have been provided ample opportunity to ask questions regarding this consent and the Services and have had my questions answered to my satisfaction. I give my informed consent for the services to be provided through the use of telemedicine in my medical care

## 2022-08-19 NOTE — Progress Notes (Signed)
Virtual Visit via Telephone Note   Because of Jeffery Davis's co-morbid illnesses, he is at least at moderate risk for complications without adequate follow up.  This format is felt to be most appropriate for this patient at this time.  The patient did not have access to video technology/had technical difficulties with video requiring transitioning to audio format only (telephone).  All issues noted in this document were discussed and addressed.  No physical exam could be performed with this format.  Please refer to the patient's chart for his consent to telehealth for Jeffery Davis.  Evaluation Performed:  Preoperative cardiovascular risk assessment _____________   Date:  08/19/2022   Patient ID:  Jeffery Davis, DOB [birth year], MRN [id number] Patient Location:  Home Provider location:   Office  Primary Care Provider:  Bosie Clos, MD Primary Cardiologist:  None  Chief Complaint / Patient Profile   77 y.o. y/o adult with a h/o hypertension, hyperlipidemia, coronary artery disease with calcium score elevated to 1200 in July 2019, atrial fibrillation status post ablation 2019 with PVI, posterior wall and CTI, CHA2DS2-VASc score 5 followed by EP (off anticoagulation since there were no further episodes of atrial fibrillation since 2019), Medtronic ILR implanted that had reached RRT, LVEF 45 to 45% in 2019 who is pending Left middle finger a1 pulley/trigger release with tenosynovectomy. Left ring finger subtotal palmar fasciectomy with repair as necessary due to Dupuytren's contracture  and presents today for telephonic preoperative cardiovascular risk assessment.  History of Present Illness    Jeffery Davis is a 77 y.o. adult who presents via audio/video conferencing for a telehealth visit today.  Pt was last seen in cardiology clinic on 03/28/2022 by Dr. Mariah Milling.  At that time Jeffery Davis was doing well.  The patient is now pending procedure as outlined above. Since his last  visit, he tells me that he has had no issues with his heart.  No chest pains, shortness of breath, or palpitations.  He tells me he typically walks 4 to 5 miles 5 days a week.  He is very active.  For this reason he is surpassed a 4 METS on the DASI.   He can hold aspirin for 5 to 7 days prior to procedure as long as asymptomatic during phone call.  Please restart medically safe to do so.   Past Medical History    Past Medical History:  Diagnosis Date   Actinic keratosis    Allergy    Arthritis    degenerative   Atrial fibrillation (HCC)    s/p cardioversion 07/18/17   Benign fibroma of prostate    Carpal tunnel syndrome, right    Erectile dysfunction    GERD (gastroesophageal reflux disease)    Hyperlipidemia    Hypertension    Lumbar herniated disc    Lumbar stenosis with neurogenic claudication    LV dysfunction    Melanoma (HCC)    Neural foraminal stenosis of lumbosacral spine    Primary localized osteoarthrosis of left shoulder 03/01/2016   Wears hearing aid in both ears    Past Surgical History:  Procedure Laterality Date   ABLATION OF DYSRHYTHMIC FOCUS  10/17/2017   ATRIAL FIBRILLATION ABLATION N/A 10/17/2017   Procedure: ATRIAL FIBRILLATION ABLATION;  Surgeon: Hillis Range, MD;  [facility];  Service: Cardiovascular;  Laterality: N/A;   CARDIAC CATHETERIZATION  2005   [facility]   CARDIOVERSION N/A 07/18/2017   Procedure: CARDIOVERSION;  Surgeon: Harold Hedge  A, MD;  Location: [facility] ORS;  Service: Cardiovascular;  Laterality: N/A;   CATARACT EXTRACTION  06/2002   COLONOSCOPY     COLONOSCOPY WITH PROPOFOL N/A 05/03/2017   Procedure: COLONOSCOPY WITH PROPOFOL;  Surgeon: Scot Jun, MD;  Location: [facility] ENDOSCOPY;  Service: Endoscopy;  Laterality: N/A;   ESOPHAGOGASTRODUODENOSCOPY (EGD) WITH PROPOFOL N/A 01/08/2016   Procedure: ESOPHAGOGASTRODUODENOSCOPY (EGD) WITH PROPOFOL;  Surgeon: Scot Jun, MD;  Location: [facility] ENDOSCOPY;   Service: Endoscopy;  Laterality: N/A;   ESOPHAGOGASTRODUODENOSCOPY (EGD) WITH PROPOFOL N/A 12/24/2018   Procedure: ESOPHAGOGASTRODUODENOSCOPY (EGD) WITH PROPOFOL;  Surgeon: Toledo, Boykin Nearing, MD;  Location: [facility] ENDOSCOPY;  Service: Gastroenterology;  Laterality: N/A;   HAND SURGERY     Implantable loop recorder placement  04/30/2018   MDT Reveal LINQ implanted [facility] [id number]) by Dr Johney Frame for afib management post ablation.   KNEE ARTHROSCOPY Right    LUMBAR LAMINECTOMY/DECOMPRESSION MICRODISCECTOMY N/A 01/09/2017   Procedure: LAMINECTOMY AND FORAMINOTOMY LUMBAR THREE-FOUR LUMBAR FOUR-FIVE  LUMBAR FIVE SACRAL ONE;  Surgeon: Tressie Stalker, MD;  [facility] OR;  Service: Neurosurgery;  Laterality: N/A;   MELANOMA EXCISION Right    BEHIND R EAR   SHOULDER SURGERY Left 2010   TOTAL SHOULDER ARTHROPLASTY Left 03/01/2016   Procedure: REVERSE TOTAL SHOULDER ARTHROPLASTY;  Surgeon: Teryl Lucy, MD;  Location: [facility] OR;  Service: Orthopedics;  Laterality: Left;    Allergies  No Known Allergies  Home Medications    Prior to Admission medications   Medication Sig Start Date End Date Taking? Authorizing Provider  amLODipine (NORVASC) 2.5 MG tablet Take 1 tablet by mouth daily. 10/22/19   [provider]  ascorbic acid (VITAMIN C) 500 MG tablet Take 1 tablet by mouth 2 (two) times daily.    [provider]  Aspirin 81 MG CAPS Take by mouth.    [provider]  Cholecalciferol (VITAMIN D3) 5000 units TABS Take 5,000 Units by mouth daily.    [provider]  Coenzyme Q10 (CO Q 10) 100 MG CAPS Take 100 mg by mouth 2 (two) times daily.     [provider]  ezetimibe (ZETIA) 10 MG tablet Take 1 tablet (10 mg total) by mouth daily. 03/28/22   Antonieta Iba, MD  fluorouracil (EFUDEX) 5 % cream Apply 1 Application topically 2 (two) times daily. 06/23/21   [provider]  Magnesium 250 MG TABS Take 250 mg by mouth 2 (two) times daily. 10/05/18    [provider]  Misc Natural Products (GLUCOSAMINE CHONDROITIN TRIPLE) TABS Take 1 tablet by mouth 2 (two) times daily.    [provider]  pantoprazole (PROTONIX) 40 MG tablet TAKE ONE (1) TABLET BY MOUTH TWO TIMES PER DAY 06/08/22   Malva Limes, MD  rosuvastatin (CRESTOR) 10 MG tablet Take 1 tablet (10 mg total) by mouth daily. 10/28/21   Bosie Clos, MD  tadalafil (CIALIS) 20 MG tablet Take 1 tablet (20 mg total) by mouth every three (3) days as needed for erectile dysfunction. 03/15/22   Simmons-Robinson, Makiera, MD  telmisartan (MICARDIS) 20 MG tablet Take 20 mg by mouth daily.  09/20/17 08/19/22  [provider]  tretinoin (RETIN-A) 0.1 % cream     [provider]    Physical Exam    Vital Signs:  Jeffery Davis does not have vital signs available for review today.  Given telephonic nature of communication, physical exam is limited. AAOx3. NAD. Normal affect.  Speech and respirations are unlabored.  Accessory Clinical  Findings    None  Assessment & Plan    1.  Preoperative Cardiovascular Risk Assessment:  Jeffery Davis perioperative risk of a major cardiac event is 0.9% according to the Revised Cardiac Risk Index (RCRI).  Therefore, he is at low risk for perioperative complications.   His functional capacity is good at 6.36 METs according to the Duke Activity Status Index (DASI). Recommendations: According to ACC/AHA guidelines, no further cardiovascular testing needed.  The patient may proceed to surgery at acceptable risk.   Antiplatelet and/or Anticoagulation Recommendations: Aspirin can be held for 5- days prior to his surgery.  Please resume Aspirin post operatively when it is felt to be safe from a bleeding standpoint.   The patient was advised that if he develops new symptoms prior to surgery to contact our office to arrange for a follow-up visit, and he verbalized understanding.   A copy of this note will be routed to requesting  surgeon.  Time:   Today, I have spent 5 minutes with the patient with telehealth technology discussing medical history, symptoms, and management plan.     Sharlene Dory, PA-C  08/19/2022, 1:23 PM

## 2022-08-19 NOTE — Telephone Encounter (Signed)
   Name: Jeffery Davis  DOB: 03/16/1945  MRN: [id number]  Primary Cardiologist: None  Chart reviewed as part of pre-operative protocol coverage. Because of Candelario Steppe Piazza's past medical history and time since last visit, he will require a follow-up telephone visit in order to better assess preoperative cardiovascular risk.  Pre-op covering staff: - Please schedule appointment and call patient to inform them. If patient already had an upcoming appointment within acceptable timeframe, please add "pre-op clearance" to the appointment notes so provider is aware. - Please contact requesting surgeon's office via preferred method (i.e, phone, fax) to inform them of need for appointment prior to surgery.  He can hold aspirin for 5 to 7 days prior to procedure as long as asymptomatic during phone call.  Please restart medically safe to do so.  Sharlene Dory, PA-C  08/19/2022, 10:51 AM

## 2022-08-23 DIAGNOSIS — M720 Palmar fascial fibromatosis [Dupuytren]: Secondary | ICD-10-CM | POA: Diagnosis not present

## 2022-08-23 DIAGNOSIS — G8918 Other acute postprocedural pain: Secondary | ICD-10-CM | POA: Diagnosis not present

## 2022-08-23 DIAGNOSIS — M65332 Trigger finger, left middle finger: Secondary | ICD-10-CM | POA: Diagnosis not present

## 2022-08-23 DIAGNOSIS — M722 Plantar fascial fibromatosis: Secondary | ICD-10-CM | POA: Diagnosis not present

## 2022-08-30 DIAGNOSIS — M79642 Pain in left hand: Secondary | ICD-10-CM | POA: Diagnosis not present

## 2022-09-05 DIAGNOSIS — M79642 Pain in left hand: Secondary | ICD-10-CM | POA: Diagnosis not present

## 2022-09-14 DIAGNOSIS — M79642 Pain in left hand: Secondary | ICD-10-CM | POA: Diagnosis not present

## 2022-09-27 DIAGNOSIS — Z86010 Personal history of colonic polyps: Secondary | ICD-10-CM | POA: Diagnosis not present

## 2022-09-27 DIAGNOSIS — Z8719 Personal history of other diseases of the digestive system: Secondary | ICD-10-CM | POA: Diagnosis not present

## 2022-09-27 DIAGNOSIS — K219 Gastro-esophageal reflux disease without esophagitis: Secondary | ICD-10-CM | POA: Diagnosis not present

## 2022-09-27 DIAGNOSIS — K5790 Diverticulosis of intestine, part unspecified, without perforation or abscess without bleeding: Secondary | ICD-10-CM | POA: Diagnosis not present

## 2022-10-03 DIAGNOSIS — M79642 Pain in left hand: Secondary | ICD-10-CM | POA: Diagnosis not present

## 2022-10-04 DIAGNOSIS — L821 Other seborrheic keratosis: Secondary | ICD-10-CM | POA: Diagnosis not present

## 2022-10-04 DIAGNOSIS — Z85828 Personal history of other malignant neoplasm of skin: Secondary | ICD-10-CM | POA: Diagnosis not present

## 2022-10-04 DIAGNOSIS — Z08 Encounter for follow-up examination after completed treatment for malignant neoplasm: Secondary | ICD-10-CM | POA: Diagnosis not present

## 2022-10-04 DIAGNOSIS — Z85820 Personal history of malignant melanoma of skin: Secondary | ICD-10-CM | POA: Diagnosis not present

## 2022-10-04 DIAGNOSIS — L570 Actinic keratosis: Secondary | ICD-10-CM | POA: Diagnosis not present

## 2022-10-04 DIAGNOSIS — L578 Other skin changes due to chronic exposure to nonionizing radiation: Secondary | ICD-10-CM | POA: Diagnosis not present

## 2022-10-04 DIAGNOSIS — L853 Xerosis cutis: Secondary | ICD-10-CM | POA: Diagnosis not present

## 2022-10-12 ENCOUNTER — Telehealth: Payer: Self-pay

## 2022-10-12 NOTE — Telephone Encounter (Signed)
LVM for patient to call back [phone], or to call PCP office to schedule follow up apt. AS, CMA  

## 2022-10-20 ENCOUNTER — Other Ambulatory Visit: Payer: Self-pay | Admitting: Cardiovascular Disease

## 2022-11-02 DIAGNOSIS — K22719 Barrett's esophagus with dysplasia, unspecified: Secondary | ICD-10-CM | POA: Diagnosis not present

## 2022-11-02 DIAGNOSIS — Z981 Arthrodesis status: Secondary | ICD-10-CM | POA: Diagnosis not present

## 2022-11-02 DIAGNOSIS — I4891 Unspecified atrial fibrillation: Secondary | ICD-10-CM | POA: Diagnosis not present

## 2022-11-02 DIAGNOSIS — E7800 Pure hypercholesterolemia, unspecified: Secondary | ICD-10-CM | POA: Diagnosis not present

## 2022-11-02 DIAGNOSIS — Z86006 Personal history of melanoma in-situ: Secondary | ICD-10-CM | POA: Diagnosis not present

## 2022-11-02 DIAGNOSIS — I428 Other cardiomyopathies: Secondary | ICD-10-CM | POA: Diagnosis not present

## 2022-11-02 DIAGNOSIS — M1712 Unilateral primary osteoarthritis, left knee: Secondary | ICD-10-CM | POA: Diagnosis not present

## 2022-11-02 DIAGNOSIS — N529 Male erectile dysfunction, unspecified: Secondary | ICD-10-CM | POA: Diagnosis not present

## 2022-11-02 DIAGNOSIS — M48061 Spinal stenosis, lumbar region without neurogenic claudication: Secondary | ICD-10-CM | POA: Diagnosis not present

## 2022-11-02 DIAGNOSIS — I10 Essential (primary) hypertension: Secondary | ICD-10-CM | POA: Diagnosis not present

## 2022-11-02 DIAGNOSIS — Z0000 Encounter for general adult medical examination without abnormal findings: Secondary | ICD-10-CM | POA: Diagnosis not present

## 2022-11-08 ENCOUNTER — Ambulatory Visit: Payer: PPO

## 2022-11-08 DIAGNOSIS — Z09 Encounter for follow-up examination after completed treatment for conditions other than malignant neoplasm: Secondary | ICD-10-CM | POA: Diagnosis not present

## 2022-11-08 DIAGNOSIS — K449 Diaphragmatic hernia without obstruction or gangrene: Secondary | ICD-10-CM | POA: Diagnosis not present

## 2022-11-08 DIAGNOSIS — K2970 Gastritis, unspecified, without bleeding: Secondary | ICD-10-CM | POA: Diagnosis not present

## 2022-11-08 DIAGNOSIS — Z86010 Personal history of colonic polyps: Secondary | ICD-10-CM | POA: Diagnosis not present

## 2022-11-08 DIAGNOSIS — K5730 Diverticulosis of large intestine without perforation or abscess without bleeding: Secondary | ICD-10-CM | POA: Diagnosis not present

## 2022-11-08 DIAGNOSIS — D128 Benign neoplasm of rectum: Secondary | ICD-10-CM | POA: Diagnosis not present

## 2022-11-08 DIAGNOSIS — K2100 Gastro-esophageal reflux disease with esophagitis, without bleeding: Secondary | ICD-10-CM | POA: Diagnosis not present

## 2022-11-08 DIAGNOSIS — K640 First degree hemorrhoids: Secondary | ICD-10-CM | POA: Diagnosis not present

## 2023-01-13 ENCOUNTER — Other Ambulatory Visit: Payer: Self-pay | Admitting: Cardiovascular Disease

## 2023-01-19 ENCOUNTER — Other Ambulatory Visit: Payer: Self-pay | Admitting: Cardiovascular Disease

## 2023-02-01 DIAGNOSIS — M7741 Metatarsalgia, right foot: Secondary | ICD-10-CM | POA: Diagnosis not present

## 2023-02-01 DIAGNOSIS — M2041 Other hammer toe(s) (acquired), right foot: Secondary | ICD-10-CM | POA: Diagnosis not present

## 2023-02-28 ENCOUNTER — Ambulatory Visit: Payer: PPO | Attending: Cardiovascular Disease | Admitting: Cardiovascular Disease

## 2023-02-28 ENCOUNTER — Encounter: Payer: Self-pay | Admitting: Cardiovascular Disease

## 2023-02-28 VITALS — BP 140/80 | HR 64 | Ht 68.0 in | Wt 183.0 lb

## 2023-02-28 DIAGNOSIS — I10 Essential (primary) hypertension: Secondary | ICD-10-CM | POA: Diagnosis not present

## 2023-02-28 DIAGNOSIS — I4819 Other persistent atrial fibrillation: Secondary | ICD-10-CM | POA: Diagnosis not present

## 2023-02-28 DIAGNOSIS — R079 Chest pain, unspecified: Secondary | ICD-10-CM

## 2023-02-28 DIAGNOSIS — I483 Typical atrial flutter: Secondary | ICD-10-CM

## 2023-02-28 DIAGNOSIS — I25118 Atherosclerotic heart disease of native coronary artery with other forms of angina pectoris: Secondary | ICD-10-CM

## 2023-02-28 NOTE — Progress Notes (Signed)
Cardiology Office Note  Date:  02/28/2023   ID:  Thurl, Kristiansen 27-Oct-1945, MRN [id number]  PCP:  Bosie Clos, MD   Chief Complaint  Patient presents with   12 month follow up    Patient c/o chest pain and shortness of breath with little activity x 2-3 months. Medications reviewed by the patient verbally.     HPI:  [name] is a 77 year old gentleman with past medical history of Hyperlipidemia Hypertension Coronary artery disease, elevated calcium score 1200 and July 2019, TIA 2019 Atrial fibrillation, status post ablation 2019 with PVI, posterior wall and CTI CHA2DS2-VASc 5, followed by EP, off anticoagulation as no further episodes of atrial fibrillation since 2019 Medtronic ILR implanted that has reached RRT EF 40-45%  in 02/2018 History of left side atypical chest pain Who presents for follow-up of his A-fib, chest pain  Last seen by myself in clinic March 28, 2022 On that visit reported having chest pain sometimes at rest, sometimes with exertion lasting several seconds at a time  Viral URI November 2024  Continues to have rare non-exertion chest pain, 1x a week Couple seconds, left side of his chest Walks , daily 4-6 miles, rare chest pain with exertion Plays Golf  Continues to have loop monitor in place, he is uncertain if this could be contributing to his left chest pain.  He was inclined not to have it removed  Stress test January 2024 performed for chest pain No ischemia  Prior stress testing 2019, unrevealing  Denies significant arrhythmia over the past 4 years since ablation Loop recorder in place, battery dead  CT scan chest pulled up from 2019 showing heavy coronary calcification in the LAD, mild in the left circumflex and RCA  Previous echocardiogram with ejection fraction 40 to 45% Prior stress echo 2021, Ef 50, up to >55% with exertion  Lab work reviewed Total cholesterol 116 LDL 46 Normal CMP Hemoglobin 12.9  Family  hx: Father dies 61 Garndfather dies 64  EKG personally reviewed by myself on todays visit EKG Interpretation Date/Time:  Tuesday February 28 2023 13:52:12 EST Ventricular Rate:  64 PR Interval:  292 QRS Duration:  92 QT Interval:  454 QTC Calculation: 468 R Axis:   7  Text Interpretation: Sinus rhythm with 1st degree A-V block When compared with ECG of 25-Nov-2021 09:31, No significant change was found Confirmed by Julien Nordmann [phone]) on 02/28/2023 2:00:57 PM     PMH:   has a past medical history of Actinic keratosis, Allergy, Arthritis, Atrial fibrillation (HCC), Benign fibroma of prostate, Carpal tunnel syndrome, right, Erectile dysfunction, GERD (gastroesophageal reflux disease), Hyperlipidemia, Hypertension, Lumbar herniated disc, Lumbar stenosis with neurogenic claudication, LV dysfunction, Melanoma (HCC), Neural foraminal stenosis of lumbosacral spine, Primary localized osteoarthrosis of left shoulder (03/01/2016), and Wears hearing aid in both ears.  PSH:    Past Surgical History:  Procedure Laterality Date   ABLATION OF DYSRHYTHMIC FOCUS  10/17/2017   ATRIAL FIBRILLATION ABLATION N/A 10/17/2017   Procedure: ATRIAL FIBRILLATION ABLATION;  Surgeon: Hillis Range, MD;  [facility]: [facility] LAB;  Service: Cardiovascular;  Laterality: N/A;   CARDIAC CATHETERIZATION  2005   [facility]   CARDIOVERSION N/A 07/18/2017   Procedure: CARDIOVERSION;  Surgeon: Dalia Heading, MD;  Location: [facility] ORS;  Service: Cardiovascular;  Laterality: N/A;   CATARACT EXTRACTION  06/2002   COLONOSCOPY     COLONOSCOPY WITH PROPOFOL N/A 05/03/2017   Procedure: COLONOSCOPY WITH PROPOFOL;  Surgeon: Scot Jun, MD;  Location: [facility] ENDOSCOPY;  Service: Endoscopy;  Laterality: N/A;   ESOPHAGOGASTRODUODENOSCOPY (EGD) WITH PROPOFOL N/A 01/08/2016   Procedure: ESOPHAGOGASTRODUODENOSCOPY (EGD) WITH PROPOFOL;  Surgeon: Scot Jun, MD;  Location: [facility] ENDOSCOPY;  Service: Endoscopy;   Laterality: N/A;   ESOPHAGOGASTRODUODENOSCOPY (EGD) WITH PROPOFOL N/A 12/24/2018   Procedure: ESOPHAGOGASTRODUODENOSCOPY (EGD) WITH PROPOFOL;  Surgeon: Toledo, Boykin Nearing, MD;  Location: [facility] ENDOSCOPY;  Service: Gastroenterology;  Laterality: N/A;   HAND SURGERY     Implantable loop recorder placement  04/30/2018   MDT Reveal LINQ implanted [facility] [id number]) by Dr Johney Frame for afib management post ablation.   KNEE ARTHROSCOPY Right    LUMBAR LAMINECTOMY/DECOMPRESSION MICRODISCECTOMY N/A 01/09/2017   Procedure: LAMINECTOMY AND FORAMINOTOMY LUMBAR THREE-FOUR LUMBAR FOUR-FIVE  LUMBAR FIVE SACRAL ONE;  Surgeon: Tressie Stalker, MD;  Location: [facility] OR;  Service: Neurosurgery;  Laterality: N/A;   MELANOMA EXCISION Right    BEHIND R EAR   SHOULDER SURGERY Left 2010   TOTAL SHOULDER ARTHROPLASTY Left 03/01/2016   Procedure: REVERSE TOTAL SHOULDER ARTHROPLASTY;  Surgeon: Teryl Lucy, MD;  Location: [facility] OR;  Service: Orthopedics;  Laterality: Left;    Current Outpatient Medications  Medication Sig Dispense Refill   amLODipine (NORVASC) 2.5 MG tablet Take 1 tablet by mouth daily.     ascorbic acid (VITAMIN C) 500 MG tablet Take 1 tablet by mouth 2 (two) times daily.     Aspirin 81 MG CAPS Take by mouth.     Cholecalciferol (VITAMIN D3) 5000 units TABS Take 5,000 Units by mouth daily.     Coenzyme Q10 (CO Q 10) 100 MG CAPS Take 100 mg by mouth 2 (two) times daily.      ezetimibe (ZETIA) 10 MG tablet TAKE ONE TABLET BY MOUTH EVERY DAY 90 tablet 0   fluorouracil (EFUDEX) 5 % cream Apply 1 Application topically 2 (two) times daily.     Magnesium 250 MG TABS Take 250 mg by mouth 2 (two) times daily.     Misc Natural Products (GLUCOSAMINE CHONDROITIN TRIPLE) TABS Take 1 tablet by mouth 2 (two) times daily.     pantoprazole (PROTONIX) 40 MG tablet TAKE ONE (1) TABLET BY MOUTH TWO TIMES PER DAY 180 tablet 0   rosuvastatin (CRESTOR) 10 MG tablet Take 1 tablet (10 mg total) by mouth daily. 90 tablet 1    tadalafil (CIALIS) 20 MG tablet Take 1 tablet (20 mg total) by mouth every three (3) days as needed for erectile dysfunction. 30 tablet 2   telmisartan (MICARDIS) 20 MG tablet TAKE 1 TABLET BY MOUTH ONCE DAILY. 90 tablet 0   tretinoin (RETIN-A) 0.1 % cream  (Patient not taking: Reported on 02/28/2023)     No current facility-administered medications for this visit.    Allergies:   Patient has no known allergies.   Social History:  The patient  reports that he has never smoked. He has never used smokeless tobacco. He reports current alcohol use of about 7.0 - 14.0 standard drinks of alcohol per week. He reports that he does not use drugs.   Family History:   family history includes CAD in his father; Cancer in his mother; Healthy in his daughter and son; Hypertension in his father.    Review of Systems: Review of Systems  Constitutional: Negative.   HENT: Negative.    Respiratory: Negative.    Cardiovascular: Negative.   Gastrointestinal: Negative.   Musculoskeletal: Negative.   Neurological: Negative.   Psychiatric/Behavioral: Negative.    All other systems reviewed and are negative.  PHYSICAL EXAM: VS:  BP (!) 140/80 (BP Location: Left Arm, Patient Position: Sitting, Cuff Size: Normal)   Pulse 64   Ht 5\' 8"  (1.727 m)   Wt 183 lb (83 kg)   SpO2 97%   BMI 27.83 kg/m  , BMI Body mass index is 27.83 kg/m. Constitutional:  oriented to person, place, and time. No distress.  HENT:  Head: Grossly normal Eyes:  no discharge. No scleral icterus.  Neck: No JVD, no carotid bruits  Cardiovascular: Regular rate and rhythm, no murmurs appreciated Pulmonary/Chest: Clear to auscultation bilaterally, no wheezes or rails Abdominal: Soft.  no distension.  no tenderness.  Musculoskeletal: Normal range of motion Neurological:  normal muscle tone. Coordination normal. No atrophy Skin: Skin warm and dry Psychiatric: normal affect, pleasant  Recent Labs: No results found for requested  labs within last 365 days.    Lipid Panel Lab Results  Component Value Date   CHOL 143 10/28/2021   HDL 44 10/28/2021   LDLCALC 73 10/28/2021   TRIG 152 (H) 10/28/2021      Wt Readings from Last 3 Encounters:  02/28/23 183 lb (83 kg)  03/28/22 172 lb 4 oz (78.1 kg)  02/01/22 175 lb (79.4 kg)     ASSESSMENT AND PLAN:  Problem List Items Addressed This Visit       Cardiology Problems   Persistent atrial fibrillation (HCC) - Primary   Relevant Orders   EKG 12-Lead (Completed)   Other Visit Diagnoses       Typical atrial flutter (HCC)       Relevant Orders   EKG 12-Lead (Completed)     Coronary artery disease of native artery of native heart with stable angina pectoris (HCC)       Relevant Orders   EKG 12-Lead (Completed)     Essential hypertension       Relevant Orders   EKG 12-Lead (Completed)      Paroxysmal atrial fibrillation Maintaining normal sinus rhythm Asymptomatic, loop battery now at end-of-life Followed by EP Currently not on anticoagulation  Coronary artery disease with stable angina Continues to have rare short episodes of fleeting chest pain lasting several seconds left chest sometimes at rest sometimes with exertion Prior stress test with no significant ischemia Prior stress testing 2019 unrevealing Heavy coronary calcification noted in the LAD, mild in the left circumflex and RCA on CT scan from 2019 Echocardiogram ordered for baseline ejection fraction By history discussed today, atypical in nature If symptoms worsen, last longer, present with exertion, could consider cardiac CTA Recommend when he has episodes to feel around his left chest for muscle spasm, pain around the loop monitor which remains in his chest  Hyperlipidemia Continue Crestor 10, add Zetia 10 mg daily  Cholesterol at goal  Essential hypertension Blood pressure is well controlled on today's visit. No changes made to the medications.  Nonischemic cardiomyopathy Repeat  echocardiogram ordered Previously estimated to be mildly depressed on echo through Keturah Barre, Dossie Arbour, M.D., Ph.D. [facility] [place], [place] [phone]

## 2023-02-28 NOTE — Patient Instructions (Signed)
Medication Instructions:  No changes  If you need a refill on your cardiac medications before your next appointment, please call your pharmacy.   Lab work: No new labs needed  Testing/Procedures: Echo for chest pain  Follow-Up: At [facility], you and your health needs are our priority.  As part of our continuing mission to provide you with exceptional heart care, we have created designated Provider Care Teams.  These Care Teams include your primary Cardiologist (physician) and Advanced Practice Providers (APPs -  Physician Assistants and Nurse Practitioners) who all work together to provide you with the care you need, when you need it.  You will need a follow up appointment in 12 months  Providers on your designated Care Team:   Nicolasa Ducking, NP Eula Listen, PA-C Cadence Fransico Michael, [place]  COVID-19 Vaccine Information can be found at: [email or web address] For questions related to vaccine distribution or appointments, please email [email or web address] or call [phone].

## 2023-03-22 ENCOUNTER — Ambulatory Visit: Payer: PPO | Attending: Cardiovascular Disease

## 2023-03-22 DIAGNOSIS — R079 Chest pain, unspecified: Secondary | ICD-10-CM

## 2023-03-22 LAB — ECHOCARDIOGRAM COMPLETE
AV Mean grad: 3 mm[Hg]
AV Peak grad: 6 mm[Hg]
Ao pk vel: 1.22 m/s
Area-P 1/2: 3.27 cm2
S' Lateral: 4.5 cm

## 2023-04-07 ENCOUNTER — Other Ambulatory Visit: Payer: Self-pay | Admitting: Family Medicine

## 2023-04-07 DIAGNOSIS — N529 Male erectile dysfunction, unspecified: Secondary | ICD-10-CM

## 2023-04-07 NOTE — Telephone Encounter (Signed)
Provider no longer at this practice.  Requested Prescriptions  Pending Prescriptions Disp Refills   tadalafil (CIALIS) 20 MG tablet [Pharmacy Med Name: TADALAFIL 20 MG TABLET] 30 tablet 2    Sig: TAKE 1 TABLET BY MOUTH EVERY 3 DAYS AS NEEDED FOR ERECTILE DYSFUNCTION     Urology: Erectile Dysfunction Agents Failed - 04/07/2023  1:15 PM      Failed - AST in normal range and within 360 days    AST  Date Value Ref Range Status  10/28/2021 35 0 - 40 IU/L Final   SGOT(AST)  Date Value Ref Range Status  09/30/2011 28 15 - 37 Unit/L Final         Failed - ALT in normal range and within 360 days    ALT  Date Value Ref Range Status  10/28/2021 33 0 - 44 IU/L Final   SGPT (ALT)  Date Value Ref Range Status  09/30/2011 31 U/L Final    Comment:    12-78 NOTE: NEW REFERENCE RANGE 02/04/2011          Failed - Last BP in normal range    BP Readings from Last 1 Encounters:  02/28/23 (!) 140/80         Failed - Valid encounter within last 12 months    Recent Outpatient Visits           1 year ago Medicare annual wellness visit, subsequent   [facility] Maple Hudson., MD   1 year ago Diarrhea, unspecified type   [facility] Maple Hudson., MD   1 year ago Essential hypertension   [facility] Maple Hudson., MD   2 years ago Gastroesophageal reflux disease, unspecified whether esophagitis present   [facility] Maple Hudson., MD   2 years ago Encounter for [employer] annual wellness exam   [facility] Maple Hudson., MD

## 2023-04-17 ENCOUNTER — Other Ambulatory Visit: Payer: Self-pay | Admitting: Cardiovascular Disease

## 2023-04-21 ENCOUNTER — Telehealth: Payer: Self-pay | Admitting: Cardiovascular Disease

## 2023-04-21 ENCOUNTER — Ambulatory Visit: Payer: PPO | Admitting: Cardiovascular Disease

## 2023-04-21 NOTE — Telephone Encounter (Signed)
 Called patient and notified him of the following results from Dr. Gollan.  Echocardiogram Low normal ejection fraction 50 to 55% Right ventricle normal size and function No significant valvular heart disease This appears unchanged compared to prior study 2021,  Patient verbalizes understanding.

## 2023-04-21 NOTE — Telephone Encounter (Signed)
 Calling nurse to get results for Echo

## 2023-04-21 NOTE — Telephone Encounter (Signed)
 Follow Up:      Patient is retuning Jeffery Davis's call.

## 2023-04-21 NOTE — Telephone Encounter (Signed)
Attempted to contact patient, LVM to call back.  Left call back number.   

## 2023-08-15 ENCOUNTER — Other Ambulatory Visit: Payer: Self-pay | Admitting: Cardiovascular Disease

## 2023-11-14 ENCOUNTER — Other Ambulatory Visit: Payer: Self-pay | Admitting: Cardiovascular Disease

## 2024-02-15 ENCOUNTER — Other Ambulatory Visit: Payer: Self-pay | Admitting: Cardiovascular Disease

## 2024-02-15 MED ORDER — EZETIMIBE 10 MG PO TABS: 10.0000 mg | ORAL_TABLET | Freq: Every day | ORAL | 0 refills | Status: DC

## 2024-02-29 NOTE — Progress Notes (Unsigned)
 Cardiology Office Note  Date:  03/01/2024   ID:  Jeffery Davis, Jeffery Davis 09/21/1945, MRN [id number]  PCP:  Bertrum Charlie CROME, MD   Chief Complaint  Patient presents with   12 month follow up     Patient c/o chest pain at times.    HPI:  Mr. Joeanthony Seeling is a 78 year old gentleman with past medical history of Hyperlipidemia Hypertension Coronary artery disease, elevated calcium  score 1200 and July 2019, TIA 2019 Atrial fibrillation, status post ablation 2019 with PVI, posterior wall and CTI CHA2DS2-VASc 5, followed by EP, off anticoagulation as no further episodes of atrial fibrillation since 2019 Med[employer] ILR implanted that has reached RRT EF 40-45%  in 02/2018 History of left side atypical chest pain Who presents for follow-up of his A-fib, chest pain  Last seen by myself in clinic 12/24 In general reports doing well Active, walks several miles daily most days of the week Plays golf Denies chest pain on exertion  Reports rare chest pain, sharp 3-5 seconds Not with exertion No significant change over the past several years  Lab work reviewed Total chol 106, LDL 31 Normal CMP  History of loop monitor in place on the left Battery dead, no significant arrhythmia over several years  Stress test January 2024 performed for chest pain No ischemia  Prior stress testing 2019, unrevealing   CT scan chest pulled up from 2019 showing heavy coronary calcification in the LAD, mild in the left circumflex and RCA  Previous echocardiogram with ejection fraction 40 to 45% Prior stress echo 2021, Ef 50, up to >55% with exertion  Family hx: Father dies 67 Garndfather dies 19  EKG personally reviewed by myself on todays visit EKG Interpretation Date/Time:  Friday March 01 2024 15:24:29 EST Ventricular Rate:  66 PR Interval:  314 QRS Duration:  100 QT Interval:  448 QTC Calculation: 469 R Axis:   11  Text Interpretation: Sinus rhythm with sinus arrhythmia with 1st degree  A-V block When compared with ECG of 28-Feb-2023 13:52, No significant change was found Confirmed by Perla Lye [phone]) on 03/01/2024 3:47:49 PM     PMH:   has a past medical history of Actinic keratosis, Allergy, Arthritis, Atrial fibrillation (HCC), Benign fibroma of prostate, Carpal tunnel syndrome, right, Erectile dysfunction, GERD (gastroesophageal reflux disease), Hyperlipidemia, Hypertension, Lumbar herniated disc, Lumbar stenosis with neurogenic claudication, LV dysfunction, Melanoma (HCC), Neural foraminal stenosis of lumbosacral spine, Primary localized osteoarthrosis of left shoulder (03/01/2016), and Wears hearing aid in both ears.  PSH:    Past Surgical History:  Procedure Laterality Date   ABLATION OF DYSRHYTHMIC FOCUS  10/17/2017   ATRIAL FIBRILLATION ABLATION N/A 10/17/2017   Procedure: ATRIAL FIBRILLATION ABLATION;  Surgeon: Kelsie Lynwood, MD;  Location: [facility] INVASIVE [facility] LAB;  Service: Cardiovascular;  Laterality: N/A;   CARDIAC CATHETERIZATION  2005   [facility]   CARDIOVERSION N/A 07/18/2017   Procedure: CARDIOVERSION;  Surgeon: Bosie Vinie LABOR, MD;  Location: [facility] ORS;  Service: Cardiovascular;  Laterality: N/A;   CATARACT EXTRACTION  06/2002   COLONOSCOPY     COLONOSCOPY WITH PROPOFOL  N/A 05/03/2017   Procedure: COLONOSCOPY WITH PROPOFOL ;  Surgeon: Viktoria Lamar DASEN, MD;  Location: [facility] ENDOSCOPY;  Service: Endoscopy;  Laterality: N/A;   ESOPHAGOGASTRODUODENOSCOPY (EGD) WITH PROPOFOL  N/A 01/08/2016   Procedure: ESOPHAGOGASTRODUODENOSCOPY (EGD) WITH PROPOFOL ;  Surgeon: Lamar DASEN Viktoria, MD;  Location: [facility] ENDOSCOPY;  Service: Endoscopy;  Laterality: N/A;   ESOPHAGOGASTRODUODENOSCOPY (EGD) WITH PROPOFOL  N/A 12/24/2018   Procedure: ESOPHAGOGASTRODUODENOSCOPY (EGD) WITH PROPOFOL ;  Surgeon: Toledo, Ladell POUR, MD;  Location: [facility] ENDOSCOPY;  Service: Gastroenterology;  Laterality: N/A;   HAND SURGERY     Implantable loop recorder placement  04/30/2018   MDT Reveal  LINQ implanted [facility] [id number]) by Dr Kelsie for afib management post ablation.   KNEE ARTHROSCOPY Right    LUMBAR LAMINECTOMY/DECOMPRESSION MICRODISCECTOMY N/A 01/09/2017   Procedure: LAMINECTOMY AND FORAMINOTOMY LUMBAR THREE-FOUR LUMBAR FOUR-FIVE  LUMBAR FIVE SACRAL ONE;  Surgeon: Mavis Purchase, MD;  Location: [facility] OR;  Service: Neurosurgery;  Laterality: N/A;   MELANOMA EXCISION Right    BEHIND R EAR   SHOULDER SURGERY Left 2010   TOTAL SHOULDER ARTHROPLASTY Left 03/01/2016   Procedure: REVERSE TOTAL SHOULDER ARTHROPLASTY;  Surgeon: Fonda Olmsted, MD;  Location: [facility] OR;  Service: Orthopedics;  Laterality: Left;    Current Outpatient Medications  Medication Sig Dispense Refill   ascorbic acid  (VITAMIN C ) 500 MG tablet Take 1 tablet by mouth 2 (two) times daily.     Aspirin  81 MG CAPS Take by mouth.     Cholecalciferol  (VITAMIN D3) 5000 units TABS Take 5,000 Units by mouth daily.     Coenzyme Q10 (CO Q 10) 100 MG CAPS Take 100 mg by mouth 2 (two) times daily.      fluorouracil (EFUDEX) 5 % cream Apply 1 Application topically 2 (two) times daily.     Magnesium  250 MG TABS Take 250 mg by mouth 2 (two) times daily.     Misc Natural Products (GLUCOSAMINE CHONDROITIN TRIPLE) TABS Take 1 tablet by mouth 2 (two) times daily.     pantoprazole  (PROTONIX ) 40 MG tablet TAKE ONE (1) TABLET BY MOUTH TWO TIMES PER DAY (Patient taking differently: Take 40 mg by mouth at bedtime.) 180 tablet 0   tadalafil  (CIALIS ) 20 MG tablet Take 1 tablet (20 mg total) by mouth every three (3) days as needed for erectile dysfunction. 30 tablet 2   amLODipine  (NORVASC ) 2.5 MG tablet Take 1 tablet (2.5 mg total) by mouth daily. 90 tablet 3   ezetimibe  (ZETIA ) 10 MG tablet Take 1 tablet (10 mg total) by mouth daily. 90 tablet 4   rosuvastatin  (CRESTOR ) 10 MG tablet Take 1 tablet (10 mg total) by mouth daily. 90 tablet 4   telmisartan  (MICARDIS ) 20 MG tablet Take 1 tablet (20 mg total) by mouth daily. 90 tablet 3   No  current facility-administered medications for this visit.    Allergies:   Patient has no known allergies.   Social History:  The patient  reports that he has never smoked. He has never used smokeless tobacco. He reports current alcohol  use of about 7.0 - 14.0 standard drinks of alcohol  per week. He reports that he does not use drugs.   Family History:   family history includes CAD in his father; Cancer in his mother; Healthy in his daughter and son; Hypertension in his father.    Review of Systems: Review of Systems  Constitutional: Negative.   HENT: Negative.    Respiratory: Negative.    Cardiovascular: Negative.   Gastrointestinal: Negative.   Musculoskeletal: Negative.   Neurological: Negative.   Psychiatric/Behavioral: Negative.    All other systems reviewed and are negative.   PHYSICAL EXAM: VS:  BP (!) 140/70 (BP Location: Left Arm, Patient Position: Sitting, Cuff Size: Normal)   Pulse 66   Ht 5' 8 (1.727 m)   Wt 181 lb (82.1 kg)   SpO2 97%   BMI 27.52 kg/m  , BMI Body mass index is 27.52  kg/m. Constitutional:  oriented to person, place, and time. No distress.  HENT:  Head: Grossly normal Eyes:  no discharge. No scleral icterus.  Neck: No JVD, no carotid bruits  Cardiovascular: Regular rate and rhythm, no murmurs appreciated Pulmonary/Chest: Clear to auscultation bilaterally, no wheezes or rales Abdominal: Soft.  no distension.  no tenderness.  Musculoskeletal: Normal range of motion Neurological:  normal muscle tone. Coordination normal. No atrophy Skin: Skin warm and dry Psychiatric: normal affect, pleasant  Recent Labs: No results found for requested labs within last 365 days.    Lipid Panel Lab Results  Component Value Date   CHOL 143 10/28/2021   HDL 44 10/28/2021   LDLCALC 73 10/28/2021   TRIG 152 (H) 10/28/2021      Wt Readings from Last 3 Encounters:  03/01/24 181 lb (82.1 kg)  02/28/23 183 lb (83 kg)  03/28/22 172 lb 4 oz (78.1 kg)      ASSESSMENT AND PLAN:  Problem List Items Addressed This Visit       Cardiology Problems   HTN, goal below 140/80   Relevant Medications   ezetimibe  (ZETIA ) 10 MG tablet   telmisartan (MICARDIS) 20 MG tablet   rosuvastatin  (CRESTOR ) 10 MG tablet   amLODipine  (NORVASC ) 2.5 MG tablet   Persistent atrial fibrillation (HCC) - Primary   Relevant Medications   ezetimibe  (ZETIA ) 10 MG tablet   telmisartan (MICARDIS) 20 MG tablet   rosuvastatin  (CRESTOR ) 10 MG tablet   amLODipine  (NORVASC ) 2.5 MG tablet   Other Relevant Orders   EKG 12-Lead (Completed)   Hypercholesteremia   Relevant Medications   ezetimibe  (ZETIA ) 10 MG tablet   telmisartan (MICARDIS) 20 MG tablet   rosuvastatin  (CRESTOR ) 10 MG tablet   amLODipine  (NORVASC ) 2.5 MG tablet   Other Visit Diagnoses       Typical atrial flutter (HCC)       Relevant Medications   ezetimibe  (ZETIA ) 10 MG tablet   telmisartan (MICARDIS) 20 MG tablet   rosuvastatin  (CRESTOR ) 10 MG tablet   amLODipine  (NORVASC ) 2.5 MG tablet   Other Relevant Orders   EKG 12-Lead (Completed)     Coronary artery disease of native artery of native heart with stable angina pectoris       Relevant Medications   ezetimibe  (ZETIA ) 10 MG tablet   telmisartan (MICARDIS) 20 MG tablet   rosuvastatin  (CRESTOR ) 10 MG tablet   amLODipine  (NORVASC ) 2.5 MG tablet   Other Relevant Orders   EKG 12-Lead (Completed)     Essential hypertension       Relevant Medications   ezetimibe  (ZETIA ) 10 MG tablet   telmisartan (MICARDIS) 20 MG tablet   rosuvastatin  (CRESTOR ) 10 MG tablet   amLODipine  (NORVASC ) 2.5 MG tablet   Other Relevant Orders   EKG 12-Lead (Completed)     Chest pain of uncertain etiology         Nonischemic cardiomyopathy (HCC)       Relevant Medications   ezetimibe  (ZETIA ) 10 MG tablet   telmisartan (MICARDIS) 20 MG tablet   rosuvastatin  (CRESTOR ) 10 MG tablet   amLODipine  (NORVASC ) 2.5 MG tablet   Other Relevant Orders   EKG 12-Lead (Completed)      Precordial pain           Paroxysmal atrial fibrillation Maintaining normal sinus rhythm -Prior data from loop monitor showing no significant arrhythmia Previously seen by EP not on anticoagulation  Coronary artery disease with stable angina -Rare fleeting episodes of chest pain unrelated to  exertion -No significant change over the past several years -Prior stress testing 2019 and 2024 with no significant ischemia No further workup at this time Cholesterol at goal  Hyperlipidemia Continue Crestor  10,  Zetia  10 mg daily  Lipids at goal  Essential hypertension Blood pressure is well controlled on today's visit. No changes made to the medications.  Nonischemic cardiomyopathy Ejection fraction 50 to 55% on study January 2025 Appears euvolemic Continue telmisartan, amlodipine    Signed, Velinda Lunger, M.D., Ph.D. [facility] [place], [place] [phone]

## 2024-03-01 ENCOUNTER — Ambulatory Visit: Attending: Cardiovascular Disease | Admitting: Cardiovascular Disease

## 2024-03-01 ENCOUNTER — Encounter: Payer: Self-pay | Admitting: Cardiovascular Disease

## 2024-03-01 VITALS — BP 140/70 | HR 66 | Ht 68.0 in | Wt 181.0 lb

## 2024-03-01 DIAGNOSIS — R072 Precordial pain: Secondary | ICD-10-CM

## 2024-03-01 DIAGNOSIS — I483 Typical atrial flutter: Secondary | ICD-10-CM

## 2024-03-01 DIAGNOSIS — R079 Chest pain, unspecified: Secondary | ICD-10-CM

## 2024-03-01 DIAGNOSIS — E7800 Pure hypercholesterolemia, unspecified: Secondary | ICD-10-CM | POA: Diagnosis not present

## 2024-03-01 DIAGNOSIS — I10 Essential (primary) hypertension: Secondary | ICD-10-CM | POA: Diagnosis not present

## 2024-03-01 DIAGNOSIS — I4819 Other persistent atrial fibrillation: Secondary | ICD-10-CM

## 2024-03-01 DIAGNOSIS — I428 Other cardiomyopathies: Secondary | ICD-10-CM | POA: Diagnosis not present

## 2024-03-01 DIAGNOSIS — I25118 Atherosclerotic heart disease of native coronary artery with other forms of angina pectoris: Secondary | ICD-10-CM

## 2024-03-01 MED ORDER — ROSUVASTATIN CALCIUM 10 MG PO TABS: 10.0000 mg | ORAL_TABLET | Freq: Every day | ORAL | 4 refills | Status: AC

## 2024-03-01 MED ORDER — TELMISARTAN 20 MG PO TABS: 20.0000 mg | ORAL_TABLET | Freq: Every day | ORAL | 3 refills | Status: AC

## 2024-03-01 MED ORDER — AMLODIPINE BESYLATE 2.5 MG PO TABS: 2.5000 mg | ORAL_TABLET | Freq: Every day | ORAL | 3 refills | Status: AC

## 2024-03-01 MED ORDER — EZETIMIBE 10 MG PO TABS: 10.0000 mg | ORAL_TABLET | Freq: Every day | ORAL | 4 refills | Status: AC

## 2024-03-01 NOTE — Patient Instructions (Signed)

## 2024-04-02 ENCOUNTER — Ambulatory Visit: Admitting: Cardiovascular Disease
